# Patient Record
Sex: Female | Born: 1985 | Race: Black or African American | Hispanic: No | Marital: Single | Smoking: Never smoker
Health system: Southern US, Community
[De-identification: ages and names within clinical notes are randomized; demographics above are authoritative.]

## PROBLEM LIST (undated history)

## (undated) ENCOUNTER — Inpatient Hospital Stay (HOSPITAL_COMMUNITY): Payer: Self-pay

## (undated) DIAGNOSIS — R87629 Unspecified abnormal cytological findings in specimens from vagina: Secondary | ICD-10-CM

## (undated) DIAGNOSIS — F419 Anxiety disorder, unspecified: Secondary | ICD-10-CM

## (undated) DIAGNOSIS — D649 Anemia, unspecified: Secondary | ICD-10-CM

---
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
## 2007-02-14 ENCOUNTER — Emergency Department (HOSPITAL_COMMUNITY): Admission: EM | Admit: 2007-02-14 | Discharge: 2007-02-14 | Payer: Self-pay | Admitting: Family Medicine

## 2007-03-30 ENCOUNTER — Emergency Department (HOSPITAL_COMMUNITY): Admission: EM | Admit: 2007-03-30 | Discharge: 2007-03-30 | Payer: Self-pay | Admitting: Family Medicine

## 2007-04-09 ENCOUNTER — Inpatient Hospital Stay (HOSPITAL_COMMUNITY): Admission: AD | Admit: 2007-04-09 | Discharge: 2007-04-09 | Payer: Self-pay | Admitting: Family Medicine

## 2007-05-08 ENCOUNTER — Emergency Department (HOSPITAL_COMMUNITY): Admission: EM | Admit: 2007-05-08 | Discharge: 2007-05-08 | Payer: Self-pay | Admitting: Emergency Medicine

## 2007-06-07 ENCOUNTER — Emergency Department (HOSPITAL_COMMUNITY): Admission: EM | Admit: 2007-06-07 | Discharge: 2007-06-07 | Payer: Self-pay | Admitting: Family Medicine

## 2007-07-05 ENCOUNTER — Emergency Department (HOSPITAL_COMMUNITY): Admission: EM | Admit: 2007-07-05 | Discharge: 2007-07-05 | Payer: Self-pay | Admitting: Family Medicine

## 2007-07-08 ENCOUNTER — Emergency Department (HOSPITAL_COMMUNITY): Admission: EM | Admit: 2007-07-08 | Discharge: 2007-07-08 | Payer: Self-pay | Admitting: Emergency Medicine

## 2007-07-09 ENCOUNTER — Emergency Department (HOSPITAL_COMMUNITY): Admission: EM | Admit: 2007-07-09 | Discharge: 2007-07-09 | Payer: Self-pay | Admitting: Emergency Medicine

## 2007-11-16 ENCOUNTER — Emergency Department (HOSPITAL_COMMUNITY): Admission: EM | Admit: 2007-11-16 | Discharge: 2007-11-16 | Payer: Self-pay | Admitting: Emergency Medicine

## 2008-04-10 ENCOUNTER — Emergency Department (HOSPITAL_COMMUNITY): Admission: EM | Admit: 2008-04-10 | Discharge: 2008-04-10 | Payer: Self-pay | Admitting: Family Medicine

## 2008-10-04 ENCOUNTER — Emergency Department (HOSPITAL_COMMUNITY): Admission: EM | Admit: 2008-10-04 | Discharge: 2008-10-04 | Payer: Self-pay | Admitting: Emergency Medicine

## 2008-12-27 ENCOUNTER — Emergency Department (HOSPITAL_COMMUNITY): Admission: EM | Admit: 2008-12-27 | Discharge: 2008-12-27 | Payer: Self-pay | Admitting: Family Medicine

## 2009-02-11 ENCOUNTER — Emergency Department (HOSPITAL_COMMUNITY): Admission: EM | Admit: 2009-02-11 | Discharge: 2009-02-11 | Payer: Self-pay | Admitting: Emergency Medicine

## 2009-06-10 ENCOUNTER — Emergency Department (HOSPITAL_COMMUNITY): Admission: EM | Admit: 2009-06-10 | Discharge: 2009-06-10 | Payer: Self-pay | Admitting: Family Medicine

## 2009-09-13 ENCOUNTER — Emergency Department (HOSPITAL_COMMUNITY): Admission: EM | Admit: 2009-09-13 | Discharge: 2009-09-13 | Payer: Self-pay | Admitting: Family Medicine

## 2009-11-19 ENCOUNTER — Emergency Department (HOSPITAL_COMMUNITY): Admission: EM | Admit: 2009-11-19 | Discharge: 2009-11-19 | Payer: Self-pay | Admitting: Family Medicine

## 2009-12-17 ENCOUNTER — Emergency Department (HOSPITAL_COMMUNITY): Admission: EM | Admit: 2009-12-17 | Discharge: 2009-12-17 | Payer: Self-pay | Admitting: Family Medicine

## 2010-01-01 ENCOUNTER — Emergency Department (HOSPITAL_COMMUNITY): Admission: EM | Admit: 2010-01-01 | Discharge: 2010-01-01 | Payer: Self-pay | Admitting: Family Medicine

## 2010-01-23 ENCOUNTER — Emergency Department (HOSPITAL_COMMUNITY): Admission: EM | Admit: 2010-01-23 | Discharge: 2010-01-23 | Payer: Self-pay | Admitting: Emergency Medicine

## 2010-01-24 ENCOUNTER — Emergency Department (HOSPITAL_COMMUNITY): Admission: EM | Admit: 2010-01-24 | Discharge: 2010-01-24 | Payer: Self-pay | Admitting: Emergency Medicine

## 2010-02-20 ENCOUNTER — Inpatient Hospital Stay (HOSPITAL_COMMUNITY): Admission: AD | Admit: 2010-02-20 | Discharge: 2010-02-21 | Payer: Self-pay | Admitting: Family Medicine

## 2010-03-10 ENCOUNTER — Emergency Department (HOSPITAL_COMMUNITY): Admission: EM | Admit: 2010-03-10 | Discharge: 2010-03-10 | Payer: Self-pay | Admitting: Emergency Medicine

## 2010-04-02 ENCOUNTER — Inpatient Hospital Stay (HOSPITAL_COMMUNITY): Admission: AD | Admit: 2010-04-02 | Discharge: 2010-04-03 | Payer: Self-pay | Admitting: Obstetrics & Gynecology

## 2010-04-02 ENCOUNTER — Ambulatory Visit: Payer: Self-pay | Admitting: Advanced Practice Midwife

## 2010-05-08 ENCOUNTER — Inpatient Hospital Stay (HOSPITAL_COMMUNITY)
Admission: AD | Admit: 2010-05-08 | Discharge: 2010-05-08 | Payer: Self-pay | Source: Home / Self Care | Admitting: Obstetrics and Gynecology

## 2010-07-29 ENCOUNTER — Inpatient Hospital Stay (HOSPITAL_COMMUNITY)
Admission: AD | Admit: 2010-07-29 | Discharge: 2010-07-29 | Payer: Self-pay | Source: Home / Self Care | Admitting: Obstetrics and Gynecology

## 2010-09-06 ENCOUNTER — Observation Stay (HOSPITAL_COMMUNITY)
Admission: RE | Admit: 2010-09-06 | Discharge: 2010-09-06 | Payer: Self-pay | Source: Home / Self Care | Admitting: Obstetrics and Gynecology

## 2010-09-11 ENCOUNTER — Inpatient Hospital Stay (HOSPITAL_COMMUNITY)
Admission: AD | Admit: 2010-09-11 | Discharge: 2010-09-12 | Payer: Self-pay | Source: Home / Self Care | Admitting: Obstetrics and Gynecology

## 2010-10-12 ENCOUNTER — Inpatient Hospital Stay (HOSPITAL_COMMUNITY)
Admission: AD | Admit: 2010-10-12 | Discharge: 2010-10-13 | Disposition: A | Discharge status: Still In Hospital | Payer: Self-pay | Source: Home / Self Care | Attending: Obstetrics and Gynecology | Admitting: Obstetrics and Gynecology

## 2010-10-18 ENCOUNTER — Inpatient Hospital Stay (HOSPITAL_COMMUNITY)
Admission: AD | Admit: 2010-10-18 | Discharge: 2010-10-22 | Payer: Self-pay | Source: Home / Self Care | Attending: Obstetrics and Gynecology | Admitting: Obstetrics and Gynecology

## 2010-10-19 ENCOUNTER — Encounter (INDEPENDENT_AMBULATORY_CARE_PROVIDER_SITE_OTHER): Payer: Self-pay | Admitting: Obstetrics and Gynecology

## 2010-11-28 ENCOUNTER — Emergency Department (HOSPITAL_COMMUNITY)
Admission: EM | Admit: 2010-11-28 | Discharge: 2010-11-28 | Payer: Self-pay | Source: Home / Self Care | Admitting: Emergency Medicine

## 2010-11-30 ENCOUNTER — Inpatient Hospital Stay (INDEPENDENT_AMBULATORY_CARE_PROVIDER_SITE_OTHER)
Admission: RE | Admit: 2010-11-30 | Discharge: 2010-11-30 | Disposition: A | Discharge status: Home / Self Care | Payer: Medicaid Other | Source: Ambulatory Visit | Attending: Family Medicine | Admitting: Family Medicine

## 2010-11-30 ENCOUNTER — Other Ambulatory Visit: Payer: Self-pay | Admitting: Family Medicine

## 2010-11-30 ENCOUNTER — Other Ambulatory Visit: Payer: Self-pay

## 2010-11-30 DIAGNOSIS — R509 Fever, unspecified: Secondary | ICD-10-CM

## 2010-11-30 LAB — POCT URINALYSIS DIPSTICK
Nitrite: NEGATIVE
Protein, ur: NEGATIVE mg/dL
Specific Gravity, Urine: 1.015 (ref 1.005–1.030)
Urobilinogen, UA: 0.2 mg/dL (ref 0.0–1.0)

## 2010-12-02 ENCOUNTER — Inpatient Hospital Stay (HOSPITAL_COMMUNITY)
Admission: AD | Admit: 2010-12-02 | Discharge: 2010-12-02 | Disposition: A | Discharge status: Home / Self Care | Payer: Medicaid Other | Source: Ambulatory Visit | Attending: Obstetrics and Gynecology | Admitting: Obstetrics and Gynecology

## 2010-12-02 DIAGNOSIS — B9789 Other viral agents as the cause of diseases classified elsewhere: Secondary | ICD-10-CM | POA: Insufficient documentation

## 2010-12-02 DIAGNOSIS — O9081 Anemia of the puerperium: Secondary | ICD-10-CM | POA: Insufficient documentation

## 2010-12-02 DIAGNOSIS — O99893 Other specified diseases and conditions complicating puerperium: Secondary | ICD-10-CM | POA: Insufficient documentation

## 2010-12-02 DIAGNOSIS — D649 Anemia, unspecified: Secondary | ICD-10-CM | POA: Insufficient documentation

## 2010-12-02 LAB — CBC
MCV: 76.9 fL — ABNORMAL LOW (ref 78.0–100.0)
Platelets: 371 10*3/uL (ref 150–400)
RDW: 15.9 % — ABNORMAL HIGH (ref 11.5–15.5)
WBC: 8.4 10*3/uL (ref 4.0–10.5)

## 2010-12-02 LAB — COMPREHENSIVE METABOLIC PANEL
ALT: 37 U/L — ABNORMAL HIGH (ref 0–35)
AST: 23 U/L (ref 0–37)
Albumin: 3.8 g/dL (ref 3.5–5.2)
Calcium: 9.8 mg/dL (ref 8.4–10.5)
GFR calc Af Amer: >60 mL/min (ref >60)
Sodium: 137 mEq/L (ref 135–145)
Total Protein: 7.6 g/dL (ref 6.0–8.3)

## 2010-12-02 LAB — URINALYSIS, ROUTINE W REFLEX MICROSCOPIC
Bilirubin Urine: NEGATIVE
Specific Gravity, Urine: 1.02 (ref 1.005–1.030)
Urobilinogen, UA: 0.2 mg/dL (ref 0.0–1.0)
pH: 6 (ref 5.0–8.0)

## 2010-12-02 LAB — URINE MICROSCOPIC-ADD ON

## 2010-12-02 LAB — WET PREP, GENITAL: Clue Cells Wet Prep HPF POC: NONE SEEN

## 2011-01-08 LAB — CBC
Hemoglobin: 6.9 g/dL — CL (ref 12.0–15.0)
MCHC: 34.7 g/dL (ref 30.0–36.0)
Platelets: 241 10*3/uL (ref 150–400)
Platelets: 317 10*3/uL (ref 150–400)
RBC: 2.51 MIL/uL — ABNORMAL LOW (ref 3.87–5.11)
RBC: 3.25 MIL/uL — ABNORMAL LOW (ref 3.87–5.11)
WBC: 6.3 10*3/uL (ref 4.0–10.5)

## 2011-01-08 LAB — WET PREP, GENITAL: Trich, Wet Prep: NONE SEEN

## 2011-01-08 LAB — RPR: RPR Ser Ql: NONREACTIVE

## 2011-01-09 LAB — URINALYSIS, ROUTINE W REFLEX MICROSCOPIC
Bilirubin Urine: NEGATIVE
Glucose, UA: NEGATIVE mg/dL
Glucose, UA: NEGATIVE mg/dL
Hgb urine dipstick: NEGATIVE
Ketones, ur: NEGATIVE mg/dL
Nitrite: NEGATIVE
Protein, ur: NEGATIVE mg/dL
Protein, ur: NEGATIVE mg/dL
pH: 6.5 (ref 5.0–8.0)

## 2011-01-09 LAB — WET PREP, GENITAL: Trich, Wet Prep: NONE SEEN

## 2011-01-09 LAB — URINE CULTURE
Colony Count: NO GROWTH
Culture  Setup Time: 201111150419

## 2011-01-09 LAB — URINE MICROSCOPIC-ADD ON

## 2011-01-11 LAB — URINALYSIS, ROUTINE W REFLEX MICROSCOPIC
Nitrite: NEGATIVE
Protein, ur: 30 mg/dL — AB
Specific Gravity, Urine: 1.01 (ref 1.005–1.030)
Urobilinogen, UA: 0.2 mg/dL (ref 0.0–1.0)

## 2011-01-11 LAB — URINE MICROSCOPIC-ADD ON

## 2011-01-14 LAB — URINALYSIS, ROUTINE W REFLEX MICROSCOPIC
Glucose, UA: NEGATIVE mg/dL
Hgb urine dipstick: NEGATIVE
Ketones, ur: 15 mg/dL — AB
Protein, ur: NEGATIVE mg/dL
pH: 5.5 (ref 5.0–8.0)

## 2011-01-15 LAB — URINALYSIS, ROUTINE W REFLEX MICROSCOPIC
Bilirubin Urine: NEGATIVE
Glucose, UA: NEGATIVE mg/dL
Ketones, ur: 15 mg/dL — AB
Nitrite: NEGATIVE
Specific Gravity, Urine: >1.03 — ABNORMAL HIGH (ref 1.005–1.030)
pH: 5.5 (ref 5.0–8.0)

## 2011-01-16 LAB — CBC
Platelets: 337 10*3/uL (ref 150–400)
RDW: 12.9 % (ref 11.5–15.5)
WBC: 8.8 10*3/uL (ref 4.0–10.5)

## 2011-01-16 LAB — URINALYSIS, ROUTINE W REFLEX MICROSCOPIC
Bilirubin Urine: NEGATIVE
Nitrite: NEGATIVE
Nitrite: NEGATIVE
Protein, ur: NEGATIVE mg/dL
Specific Gravity, Urine: 1.018 (ref 1.005–1.030)
Specific Gravity, Urine: 1.02 (ref 1.005–1.030)
Urobilinogen, UA: 0.2 mg/dL (ref 0.0–1.0)
Urobilinogen, UA: 0.2 mg/dL (ref 0.0–1.0)
pH: 5.5 (ref 5.0–8.0)

## 2011-01-16 LAB — GC/CHLAMYDIA PROBE AMP, GENITAL: GC Probe Amp, Genital: NEGATIVE

## 2011-01-16 LAB — POCT I-STAT, CHEM 8
Hemoglobin: 11.2 g/dL — ABNORMAL LOW (ref 12.0–15.0)
Potassium: 3.9 mEq/L (ref 3.5–5.1)
Sodium: 136 mEq/L (ref 135–145)
TCO2: 22 mmol/L (ref 0–100)

## 2011-01-16 LAB — WET PREP, GENITAL: Yeast Wet Prep HPF POC: NONE SEEN

## 2011-01-16 LAB — ABO/RH: ABO/RH(D): B POS

## 2011-01-16 LAB — POCT PREGNANCY, URINE
Preg Test, Ur: POSITIVE
Preg Test, Ur: POSITIVE

## 2011-01-16 LAB — HCG, QUANTITATIVE, PREGNANCY: hCG, Beta Chain, Quant, S: 13252 m[IU]/mL — ABNORMAL HIGH (ref <5)

## 2011-01-17 LAB — POCT URINALYSIS DIP (DEVICE)
Bilirubin Urine: NEGATIVE
Ketones, ur: NEGATIVE mg/dL
Specific Gravity, Urine: 1.015 (ref 1.005–1.030)
pH: 7 (ref 5.0–8.0)

## 2011-01-19 LAB — CBC
HCT: 33 % — ABNORMAL LOW (ref 36.0–46.0)
Hemoglobin: 11.1 g/dL — ABNORMAL LOW (ref 12.0–15.0)
MCH: 25.7 pg — ABNORMAL LOW (ref 26.0–34.0)
RBC: 4.32 MIL/uL (ref 3.87–5.11)

## 2011-01-19 LAB — POCT URINALYSIS DIP (DEVICE)
Bilirubin Urine: NEGATIVE
Glucose, UA: NEGATIVE mg/dL
Specific Gravity, Urine: 1.015 (ref 1.005–1.030)
Urobilinogen, UA: 0.2 mg/dL (ref 0.0–1.0)

## 2011-01-21 LAB — DIFFERENTIAL
Eosinophils Absolute: 0.1 10*3/uL (ref 0.0–0.7)
Eosinophils Relative: 2 % (ref 0–5)
Lymphs Abs: 1.4 10*3/uL (ref 0.7–4.0)
Monocytes Absolute: 0.3 10*3/uL (ref 0.1–1.0)
Monocytes Relative: 4 % (ref 3–12)

## 2011-01-21 LAB — COMPREHENSIVE METABOLIC PANEL
ALT: 20 U/L (ref 0–35)
AST: 24 U/L (ref 0–37)
Albumin: 4.5 g/dL (ref 3.5–5.2)
CO2: 27 mEq/L (ref 19–32)
Calcium: 9.4 mg/dL (ref 8.4–10.5)
GFR calc Af Amer: >60 mL/min (ref >60)
GFR calc non Af Amer: >60 mL/min (ref >60)
Sodium: 136 mEq/L (ref 135–145)

## 2011-01-21 LAB — URINE CULTURE

## 2011-01-21 LAB — URINALYSIS, ROUTINE W REFLEX MICROSCOPIC
Nitrite: NEGATIVE
Nitrite: POSITIVE — AB
Specific Gravity, Urine: 1.018 (ref 1.005–1.030)
Specific Gravity, Urine: 1.021 (ref 1.005–1.030)
Urobilinogen, UA: 0.2 mg/dL (ref 0.0–1.0)
pH: 5.5 (ref 5.0–8.0)

## 2011-01-21 LAB — URINE MICROSCOPIC-ADD ON

## 2011-01-21 LAB — CBC
MCHC: 34 g/dL (ref 30.0–36.0)
RBC: 4.05 MIL/uL (ref 3.87–5.11)
WBC: 7.1 10*3/uL (ref 4.0–10.5)

## 2011-01-21 LAB — POCT PREGNANCY, URINE: Preg Test, Ur: NEGATIVE

## 2011-02-07 LAB — DIFFERENTIAL
Eosinophils Relative: 4 % (ref 0–5)
Lymphocytes Relative: 31 % (ref 12–46)
Lymphs Abs: 1.6 10*3/uL (ref 0.7–4.0)
Monocytes Absolute: 0.3 10*3/uL (ref 0.1–1.0)
Neutro Abs: 2.9 10*3/uL (ref 1.7–7.7)

## 2011-02-07 LAB — URINALYSIS, ROUTINE W REFLEX MICROSCOPIC
Bilirubin Urine: NEGATIVE
Hgb urine dipstick: NEGATIVE
Nitrite: NEGATIVE
Specific Gravity, Urine: 1.007 (ref 1.005–1.030)
pH: 6 (ref 5.0–8.0)

## 2011-02-07 LAB — CBC
MCHC: 34.6 g/dL (ref 30.0–36.0)
MCV: 87.7 fL (ref 78.0–100.0)
Platelets: 276 10*3/uL (ref 150–400)
RDW: 14 % (ref 11.5–15.5)
WBC: 5.1 10*3/uL (ref 4.0–10.5)

## 2011-02-07 LAB — COMPREHENSIVE METABOLIC PANEL
AST: 20 U/L (ref 0–37)
Albumin: 4 g/dL (ref 3.5–5.2)
BUN: 9 mg/dL (ref 6–23)
Calcium: 9.3 mg/dL (ref 8.4–10.5)
Creatinine, Ser: 0.93 mg/dL (ref 0.4–1.2)
GFR calc Af Amer: >60 mL/min (ref >60)
Total Protein: 6.8 g/dL (ref 6.0–8.3)

## 2011-03-09 ENCOUNTER — Inpatient Hospital Stay (HOSPITAL_COMMUNITY)
Admission: AD | Admit: 2011-03-09 | Discharge: 2011-03-09 | Disposition: A | Discharge status: Home / Self Care | Payer: 59 | Source: Ambulatory Visit | Attending: Obstetrics and Gynecology | Admitting: Obstetrics and Gynecology

## 2011-03-09 DIAGNOSIS — N390 Urinary tract infection, site not specified: Secondary | ICD-10-CM | POA: Insufficient documentation

## 2011-03-09 DIAGNOSIS — R300 Dysuria: Secondary | ICD-10-CM | POA: Insufficient documentation

## 2011-03-09 LAB — WET PREP, GENITAL
Trich, Wet Prep: NONE SEEN
Yeast Wet Prep HPF POC: NONE SEEN

## 2011-03-09 LAB — URINALYSIS, ROUTINE W REFLEX MICROSCOPIC
Bilirubin Urine: NEGATIVE
Glucose, UA: NEGATIVE mg/dL
Specific Gravity, Urine: 1.02 (ref 1.005–1.030)
pH: 6 (ref 5.0–8.0)

## 2011-03-09 LAB — URINE MICROSCOPIC-ADD ON

## 2011-03-09 LAB — POCT PREGNANCY, URINE: Preg Test, Ur: NEGATIVE

## 2011-03-10 LAB — GC/CHLAMYDIA PROBE AMP, GENITAL: Chlamydia, DNA Probe: NEGATIVE

## 2011-03-11 LAB — URINE CULTURE
Colony Count: >=100000
Culture  Setup Time: 201205111938

## 2011-04-03 ENCOUNTER — Inpatient Hospital Stay (INDEPENDENT_AMBULATORY_CARE_PROVIDER_SITE_OTHER)
Admission: RE | Admit: 2011-04-03 | Discharge: 2011-04-03 | Disposition: A | Discharge status: Home / Self Care | Payer: Self-pay | Source: Ambulatory Visit | Attending: Family Medicine | Admitting: Family Medicine

## 2011-04-03 DIAGNOSIS — J019 Acute sinusitis, unspecified: Secondary | ICD-10-CM

## 2011-05-09 ENCOUNTER — Emergency Department (HOSPITAL_COMMUNITY)
Admission: EM | Admit: 2011-05-09 | Discharge: 2011-05-09 | Disposition: A | Discharge status: Home / Self Care | Payer: No Typology Code available for payment source | Attending: Emergency Medicine | Admitting: Emergency Medicine

## 2011-05-09 ENCOUNTER — Emergency Department (HOSPITAL_COMMUNITY): Payer: No Typology Code available for payment source

## 2011-05-09 DIAGNOSIS — M545 Low back pain, unspecified: Secondary | ICD-10-CM | POA: Insufficient documentation

## 2011-05-09 DIAGNOSIS — Y92410 Unspecified street and highway as the place of occurrence of the external cause: Secondary | ICD-10-CM | POA: Insufficient documentation

## 2011-05-09 DIAGNOSIS — S335XXA Sprain of ligaments of lumbar spine, initial encounter: Secondary | ICD-10-CM | POA: Insufficient documentation

## 2011-07-13 ENCOUNTER — Inpatient Hospital Stay (HOSPITAL_COMMUNITY)
Admission: RE | Admit: 2011-07-13 | Discharge: 2011-07-13 | Disposition: A | Discharge status: Home / Self Care | Payer: Medicaid Other | Source: Ambulatory Visit | Attending: Emergency Medicine | Admitting: Emergency Medicine

## 2011-07-16 ENCOUNTER — Emergency Department (HOSPITAL_COMMUNITY)
Admission: EM | Admit: 2011-07-16 | Discharge: 2011-07-17 | Discharge status: Against Medical Advice | Payer: Medicaid Other | Attending: Emergency Medicine | Admitting: Emergency Medicine

## 2011-07-16 DIAGNOSIS — M542 Cervicalgia: Secondary | ICD-10-CM | POA: Insufficient documentation

## 2011-07-16 DIAGNOSIS — R51 Headache: Secondary | ICD-10-CM | POA: Insufficient documentation

## 2011-08-13 LAB — POCT URINALYSIS DIP (DEVICE)
Ketones, ur: NEGATIVE
Operator id: 270961
Protein, ur: 30 — AB
Specific Gravity, Urine: 1.02

## 2011-08-13 LAB — GC/CHLAMYDIA PROBE AMP, GENITAL: GC Probe Amp, Genital: NEGATIVE

## 2011-08-13 LAB — WET PREP, GENITAL: Yeast Wet Prep HPF POC: NONE SEEN

## 2011-08-14 LAB — POCT URINALYSIS DIP (DEVICE)
Ketones, ur: NEGATIVE
Protein, ur: 100 — AB
Specific Gravity, Urine: 1.015
pH: 6.5

## 2011-08-14 LAB — RPR: RPR Ser Ql: NONREACTIVE

## 2011-08-14 LAB — POCT PREGNANCY, URINE: Operator id: 239701

## 2011-08-14 LAB — GC/CHLAMYDIA PROBE AMP, GENITAL
Chlamydia, DNA Probe: POSITIVE — AB
GC Probe Amp, Genital: NEGATIVE

## 2011-08-14 LAB — HIV ANTIBODY (ROUTINE TESTING W REFLEX): HIV: NONREACTIVE

## 2011-08-14 LAB — WET PREP, GENITAL

## 2011-08-16 LAB — URINALYSIS, ROUTINE W REFLEX MICROSCOPIC
Nitrite: NEGATIVE
Specific Gravity, Urine: 1.025
pH: 6

## 2011-08-16 LAB — GC/CHLAMYDIA PROBE AMP, GENITAL
Chlamydia, DNA Probe: NEGATIVE
GC Probe Amp, Genital: NEGATIVE

## 2011-08-16 LAB — CBC
HCT: 31.8 — ABNORMAL LOW
Hemoglobin: 11 — ABNORMAL LOW
MCHC: 34.5
MCV: 87.1
Platelets: 328 — ABNORMAL HIGH
RBC: 3.65 — ABNORMAL LOW
RDW: 13.1
WBC: 8.3

## 2011-08-16 LAB — WET PREP, GENITAL
Clue Cells Wet Prep HPF POC: NONE SEEN
Trich, Wet Prep: NONE SEEN
Yeast Wet Prep HPF POC: NONE SEEN

## 2011-08-16 LAB — POCT PREGNANCY, URINE: Operator id: 20265

## 2011-08-16 LAB — POCT RAPID STREP A: Streptococcus, Group A Screen (Direct): NEGATIVE

## 2011-09-18 ENCOUNTER — Emergency Department (HOSPITAL_COMMUNITY)
Admission: EM | Admit: 2011-09-18 | Discharge: 2011-09-18 | Disposition: A | Discharge status: Home / Self Care | Payer: Medicaid Other | Source: Home / Self Care | Attending: Emergency Medicine | Admitting: Emergency Medicine

## 2011-09-18 ENCOUNTER — Encounter: Payer: Self-pay | Admitting: *Deleted

## 2011-09-18 DIAGNOSIS — R0982 Postnasal drip: Secondary | ICD-10-CM

## 2011-09-18 DIAGNOSIS — J329 Chronic sinusitis, unspecified: Secondary | ICD-10-CM

## 2011-09-18 LAB — POCT URINALYSIS DIP (DEVICE)
Leukocytes, UA: NEGATIVE
Nitrite: NEGATIVE
Urobilinogen, UA: 0.2 mg/dL (ref 0.0–1.0)
pH: 5 (ref 5.0–8.0)

## 2011-09-18 MED ORDER — FEXOFENADINE-PSEUDOEPHED ER 60-120 MG PO TB12: 1.0000 | ORAL_TABLET | Freq: Two times a day (BID) | ORAL | Status: DC

## 2011-09-18 MED ORDER — PREDNISONE 20 MG PO TABS: 40.0000 mg | ORAL_TABLET | Freq: Every day | ORAL | Status: AC

## 2011-09-18 NOTE — ED Notes (Signed)
Pt reports h/o many sinus headaches the past several months.  She has gotten care for these at another Urgent Care and took amoxicillin for this twice.  Today c/o thick sinus congestion, neck pain  nausea and headache.  Denies fever

## 2011-09-18 NOTE — ED Provider Notes (Addendum)
History     CSN: [id number] Arrival date & time: 09/18/2011 11:31 AM   First MD Initiated Contact with Patient 09/18/11 1043      Chief Complaint  Patient presents with  . Headache    (Consider location/radiation/quality/duration/timing/severity/associated sxs/prior treatment) HPI Comments: Been getting this sinus infections, feel dripping behind my throat and pressure on my face, blow my nose with thick discharge, green at times, have been treated many times for the same, my stomach its "hurting ad makes my nauseous" have completed several antibiotics  Patient is a 25 y.o. female presenting with headaches. The history is provided by the patient.  Headache The primary symptoms include headaches and nausea. Primary symptoms do not include altered mental status, paresthesias, loss of sensation, speech change or vomiting. The symptoms began more than 1 week ago. The symptoms are worsening.  The headache is not associated with photophobia, paresthesias or weakness.  Additional symptoms do not include weakness, photophobia or tinnitus.    History reviewed. No pertinent past medical history.  Past Surgical History  Procedure Date  . Cesarean section     No family history on file.  History  Substance Use Topics  . Smoking status: Never Smoker   . Smokeless tobacco: Never Used  . Alcohol Use: No    OB History    Grav Para Term Preterm Abortions TAB SAB Ect Mult Living                  Review of Systems  HENT: Negative for tinnitus.   Eyes: Negative for photophobia.  Gastrointestinal: Positive for nausea. Negative for vomiting.  Neurological: Positive for headaches. Negative for speech change, weakness and paresthesias.  Psychiatric/Behavioral: Negative for altered mental status.    Allergies  Review of patient's allergies indicates no known allergies.  Home Medications  No current outpatient prescriptions on file.  BP 122/72  Pulse 70  Temp(Src) 99.1 F (37.3 C)  (Oral)  Resp 16  SpO2 100%  LMP 09/13/2011  Physical Exam  Nursing note and vitals reviewed. Constitutional: She is oriented to person, place, and time. She appears well-developed and well-nourished.  HENT:  Head: Normocephalic.  Right Ear: Tympanic membrane normal.  Left Ear: Tympanic membrane normal.  Mouth/Throat: Uvula is midline, oropharynx is clear and moist and mucous membranes are normal. No oropharyngeal exudate.  Eyes: Pupils are equal, round, and reactive to light.  Pulmonary/Chest: Effort normal and breath sounds normal.  Abdominal: Soft.  Neurological: She is alert and oriented to person, place, and time. No cranial nerve deficit. Coordination normal.  Skin: Skin is warm. No rash noted.    ED Course  Procedures (including critical care time)  Labs Reviewed  POCT URINALYSIS DIP (DEVICE) - Abnormal; Notable for the following:    Hgb urine dipstick TRACE (*)    All other components within normal limits  POCT PREGNANCY, URINE  POCT PREGNANCY, URINE  POCT URINALYSIS DIPSTICK   No results found.   No diagnosis found.    MDM  recurent sinusitis > 6 episodes in 6 months treated at another Urgent Care in town-        Jimmie Molly, MD 09/18/11 1257  Jimmie Molly, MD 09/18/11 1311  Jimmie Molly, MD 09/18/11 [phone]

## 2011-09-19 ENCOUNTER — Telehealth (HOSPITAL_COMMUNITY): Payer: Self-pay | Admitting: *Deleted

## 2011-12-03 ENCOUNTER — Encounter (HOSPITAL_COMMUNITY): Payer: Self-pay | Admitting: *Deleted

## 2011-12-03 ENCOUNTER — Emergency Department (HOSPITAL_COMMUNITY)
Admission: EM | Admit: 2011-12-03 | Discharge: 2011-12-03 | Disposition: A | Discharge status: Home / Self Care | Payer: Self-pay | Attending: Emergency Medicine | Admitting: Emergency Medicine

## 2011-12-03 DIAGNOSIS — R059 Cough, unspecified: Secondary | ICD-10-CM | POA: Insufficient documentation

## 2011-12-03 DIAGNOSIS — H9209 Otalgia, unspecified ear: Secondary | ICD-10-CM | POA: Insufficient documentation

## 2011-12-03 DIAGNOSIS — R05 Cough: Secondary | ICD-10-CM | POA: Insufficient documentation

## 2011-12-03 DIAGNOSIS — J069 Acute upper respiratory infection, unspecified: Secondary | ICD-10-CM | POA: Insufficient documentation

## 2011-12-03 DIAGNOSIS — IMO0001 Reserved for inherently not codable concepts without codable children: Secondary | ICD-10-CM | POA: Insufficient documentation

## 2011-12-03 MED ORDER — CETIRIZINE-PSEUDOEPHEDRINE ER 5-120 MG PO TB12: 1.0000 | ORAL_TABLET | Freq: Every day | ORAL | Status: DC

## 2011-12-03 MED ORDER — HYDROCODONE-HOMATROPINE 5-1.5 MG/5ML PO SYRP: 5.0000 mL | ORAL_SOLUTION | Freq: Four times a day (QID) | ORAL | Status: AC | PRN

## 2011-12-03 NOTE — ED Notes (Signed)
Pt reports nonproductive cough since Saturday. Reports sore throat and earache bilaterally. Denies fever.

## 2011-12-03 NOTE — ED Provider Notes (Signed)
History     CSN: [id number]  Arrival date & time 12/03/11  1320   First MD Initiated Contact with Patient 12/03/11 1523      Chief Complaint  Patient presents with  . Cough  . Sore Throat  . Otalgia    (Consider location/radiation/quality/duration/timing/severity/associated sxs/prior treatment) HPI Comments: Pt presents to the ED with complaints of URI symptoms of non-productive cough, congestion, sore throat, earache,  The patient states that the symptoms started 3 days ago.  Pt has not been around other sick contacts and reports that she suffers from seasonal allergies. The patient denies fevers, night sweats, chills, headaches, neck pain, weakness, vision changes, severe abdominal pain, inability to eat or drink, difficulty breathing, SOB, wheezing, & chest pain. The patient has tried cough medicine, NSAIDS, and rest but has only felt mild relief.    Patient is a 26 y.o. female presenting with cough, pharyngitis, and ear pain. The history is provided by the patient.  Cough Associated symptoms include ear pain and myalgias. Pertinent negatives include no chest pain, no chills, no rhinorrhea and no sore throat.  Sore Throat Associated symptoms include coughing and myalgias. Pertinent negatives include no abdominal pain, chest pain, chills, congestion, fatigue, fever, nausea, neck pain, rash, sore throat, vomiting or weakness.  Otalgia Associated symptoms include cough. Pertinent negatives include no rhinorrhea, no sore throat, no abdominal pain, no diarrhea, no vomiting, no neck pain and no rash.    History reviewed. No pertinent past medical history.  Past Surgical History  Procedure Date  . Cesarean section     No family history on file.  History  Substance Use Topics  . Smoking status: Never Smoker   . Smokeless tobacco: Never Used  . Alcohol Use: No    OB History    Grav Para Term Preterm Abortions TAB SAB Ect Mult Living                  Review of Systems    Constitutional: Negative for fever, chills and fatigue.  HENT: Positive for ear pain. Negative for congestion, sore throat, rhinorrhea, sneezing, neck pain, neck stiffness, sinus pressure and tinnitus.   Eyes: Negative for visual disturbance.  Respiratory: Positive for cough. Negative for chest tightness.   Cardiovascular: Negative for chest pain and palpitations.  Gastrointestinal: Negative for nausea, vomiting, abdominal pain and diarrhea.  Genitourinary: Negative for dysuria.  Musculoskeletal: Positive for myalgias.  Skin: Negative for color change and rash.  Neurological: Negative for dizziness and weakness.  Hematological: Does not bruise/bleed easily.  Psychiatric/Behavioral: Negative for confusion.  All other systems reviewed and are negative.    Allergies  Review of patient's allergies indicates no known allergies.  Home Medications   Current Outpatient Rx  Name Route Sig Dispense Refill  . LEVONORGESTREL-ETHINYL ESTRAD 0.1-20 MG-MCG PO TABS Oral Take 1 tablet by mouth daily.      BP 109/73  Pulse 91  Temp(Src) 98.5 F (36.9 C) (Oral)  Resp 16  Ht 5\' 2"  (1.575 m)  Wt 130 lb (58.968 kg)  BMI 23.78 kg/m2  SpO2 95%  Physical Exam  Nursing note and vitals reviewed. Constitutional: She is oriented to person, place, and time. She appears well-developed and well-nourished. No distress.  HENT:  Head: Normocephalic and atraumatic. No trismus in the jaw.  Right Ear: External ear normal. No drainage or tenderness. No mastoid tenderness.  Left Ear: External ear normal. No drainage or tenderness. No mastoid tenderness.  Nose: Nose normal. No rhinorrhea or sinus  tenderness.  Mouth/Throat: Uvula is midline, oropharynx is clear and moist and mucous membranes are normal. No uvula swelling. No oropharyngeal exudate.  Eyes: Conjunctivae and EOM are normal. Right eye exhibits no discharge. Left eye exhibits no discharge. No scleral icterus.  Neck: Normal range of motion. Neck  supple.  Cardiovascular: Normal rate, regular rhythm and normal heart sounds.   Pulmonary/Chest: Effort normal and breath sounds normal. No stridor. No respiratory distress. She has no wheezes. She has no rales. She exhibits no tenderness.  Abdominal: Soft. There is no tenderness.  Musculoskeletal: Normal range of motion.  Neurological: She is alert and oriented to person, place, and time.  Skin: Skin is warm and dry. No rash noted. She is not diaphoretic.  Psychiatric: She has a normal mood and affect. Her behavior is normal.    ED Course  Procedures (including critical care time)  Labs Reviewed - No data to display No results found.   No diagnosis found.     MDM  Viral URI  Patient with symptoms consistent with URI  Vitals are stable, afebrile.  No signs of dehydration, tolerating PO's.  Lungs are clear. Due to patient's presentation and physical exam a chest x-ray was not ordered bc likely diagnosis of viral etiology.  Patient will be discharged with instructions to orally hydrate, rest, and use over-the-counter medications such as anti-inflammatories ibuprofen and Aleve for muscle aches and Tylenol for fever.  Patient will also be given a cough suppressant. Patient has been told to use OTC Zyrtec for seasonal allergies        Jaci Carrel, [place] 12/03/11 1554

## 2011-12-03 NOTE — ED Provider Notes (Signed)
Medical screening examination/treatment/procedure(s) were performed by non-physician practitioner and as supervising physician I was immediately available for consultation/collaboration.   Dayton Bailiff, MD 12/03/11 2342

## 2012-04-05 ENCOUNTER — Emergency Department (INDEPENDENT_AMBULATORY_CARE_PROVIDER_SITE_OTHER)
Admission: EM | Admit: 2012-04-05 | Discharge: 2012-04-05 | Disposition: A | Discharge status: Home / Self Care | Payer: Self-pay | Source: Home / Self Care | Attending: Emergency Medicine | Admitting: Emergency Medicine

## 2012-04-05 ENCOUNTER — Encounter (HOSPITAL_COMMUNITY): Payer: Self-pay

## 2012-04-05 DIAGNOSIS — J329 Chronic sinusitis, unspecified: Secondary | ICD-10-CM

## 2012-04-05 MED ORDER — FLUCONAZOLE 150 MG PO TABS: 150.0000 mg | ORAL_TABLET | Freq: Every day | ORAL | Status: AC

## 2012-04-05 MED ORDER — AMOXICILLIN 500 MG PO CAPS: 500.0000 mg | ORAL_CAPSULE | Freq: Three times a day (TID) | ORAL | Status: AC

## 2012-04-05 MED ORDER — FEXOFENADINE-PSEUDOEPHED ER 60-120 MG PO TB12: 1.0000 | ORAL_TABLET | Freq: Two times a day (BID) | ORAL | Status: DC

## 2012-04-05 NOTE — ED Notes (Signed)
Pt states she is having sinus pressure/pain and drainage down the back of her throat for 2 months. States drainage is making her have nausea.

## 2012-04-05 NOTE — ED Provider Notes (Addendum)
History     CSN: [id number]  Arrival date & time 04/05/12  1144   First MD Initiated Contact with Patient 04/05/12 1311      Chief Complaint  Patient presents with  . Facial Pain    sinus pressure and pain    (Consider location/radiation/quality/duration/timing/severity/associated sxs/prior treatment) HPI Comments: Patient presents urgent care complaining of sinus pressure and pain and drainage down her throat for about 2 months she has tried Sudafed over-the-counter another antihistamines but she's not feeling any better. The pressure is worse she points to both maxillary sinuses, popping sensations in both her years. Dripping in the back of her throat at times makes her nauseous. Patient denies any fevers, or other constitutional symptoms such as bodyaches, lack of appetite, denies other neurological symptoms such as weakness paresthesias or vertigo.  The history is provided by the patient.    History reviewed. No pertinent past medical history.  Past Surgical History  Procedure Date  . Cesarean section     No family history on file.  History  Substance Use Topics  . Smoking status: Never Smoker   . Smokeless tobacco: Never Used  . Alcohol Use: No    OB History    Grav Para Term Preterm Abortions TAB SAB Ect Mult Living                  Review of Systems  Constitutional: Negative for activity change and appetite change.  HENT: Positive for ear pain, congestion and sinus pressure. Negative for trouble swallowing, neck pain, neck stiffness, dental problem, voice change and tinnitus.   Eyes: Negative for photophobia, discharge and visual disturbance.  Respiratory: Negative for cough, choking and shortness of breath.   Cardiovascular: Negative for chest pain.  Skin: Negative for rash.  Neurological: Negative for dizziness, tremors, seizures and numbness.    Allergies  Review of patient's allergies indicates no known allergies.  Home Medications   Current  Outpatient Rx  Name Route Sig Dispense Refill  . AMOXICILLIN 500 MG PO CAPS Oral Take 1 capsule (500 mg total) by mouth 3 (three) times daily. 30 capsule 0  . CETIRIZINE-PSEUDOEPHEDRINE ER 5-120 MG PO TB12 Oral Take 1 tablet by mouth daily. 30 tablet 0  . FEXOFENADINE-PSEUDOEPHED ER 60-120 MG PO TB12 Oral Take 1 tablet by mouth 2 (two) times daily. 30 tablet 0  . FLUCONAZOLE 150 MG PO TABS Oral Take 1 tablet (150 mg total) by mouth daily. 2 tablet 0  . LEVONORGESTREL-ETHINYL ESTRAD 0.1-20 MG-MCG PO TABS Oral Take 1 tablet by mouth daily.      BP 104/67  Pulse 64  Temp(Src) 97.4 F (36.3 C) (Oral)  Resp 16  SpO2 99%  LMP 03/29/2012  Physical Exam  Nursing note and vitals reviewed. Constitutional: She appears well-developed and well-nourished.  HENT:  Head: Normocephalic.    Right Ear: Hearing and tympanic membrane normal.  Left Ear: Hearing and tympanic membrane normal.  Mouth/Throat: Uvula is midline and oropharynx is clear and moist. No oropharyngeal exudate, posterior oropharyngeal edema, posterior oropharyngeal erythema or tonsillar abscesses.  Eyes: Conjunctivae are normal.  Pulmonary/Chest: Effort normal and breath sounds normal.    ED Course  Procedures (including critical care time)  Labs Reviewed - No data to display No results found.   Maxillary sinusitis. Antihistamine course of 2 weeks with antibiotics for 10 days and instructed to followup with ENT Dr. if no improvement after treatment cycle   MDM  Recurrent sinusitis for 2 months. Patient seemed to  demonstrate focal to both maxillary regions. Should agree with followup care with referral ENT if symptoms persist or not fully resolved. She was afebrile and looks comfortable.        Jimmie Molly, MD 04/05/12 1328  Jimmie Molly, MD 04/05/12 1329

## 2012-04-05 NOTE — Discharge Instructions (Signed)
  If no improvement after 2 weeks after treatment followup with the ENT  Sinusitis Sinuses are air pockets within the bones of your face. The growth of bacteria within a sinus leads to infection. The infection prevents the sinuses from draining. This infection is called sinusitis. SYMPTOMS  There will be different areas of pain depending on which sinuses have become infected.  The maxillary sinuses often produce pain beneath the eyes.   Frontal sinusitis may cause pain in the middle of the forehead and above the eyes.  Other problems (symptoms) include:  Toothaches.   Colored, pus-like (purulent) drainage from the nose.   Swelling, warmth, and tenderness over the sinus areas may be signs of infection.  TREATMENT  Sinusitis is most often determined by an exam.X-rays may be taken. If x-rays have been taken, make sure you obtain your results or find out how you are to obtain them. Your caregiver may give you medications (antibiotics). These are medications that will help kill the bacteria causing the infection. You may also be given a medication (decongestant) that helps to reduce sinus swelling.  HOME CARE INSTRUCTIONS   Only take over-the-counter or prescription medicines for pain, discomfort, or fever as directed by your caregiver.   Drink extra fluids. Fluids help thin the mucus so your sinuses can drain more easily.   Applying either moist heat or ice packs to the sinus areas may help relieve discomfort.   Use saline nasal sprays to help moisten your sinuses. The sprays can be found at your local drugstore.  SEEK IMMEDIATE MEDICAL CARE IF:  You have a fever.   You have increasing pain, severe headaches, or toothache.   You have nausea, vomiting, or drowsiness.   You develop unusual swelling around the face or trouble seeing.  MAKE SURE YOU:   Understand these instructions.   Will watch your condition.   Will get help right away if you are not doing well or get worse.    Document Released: 10/15/2005 Document Revised: 10/04/2011 Document Reviewed: 05/14/2007 [facility] Patient Information 2012 [place], [place].

## 2012-04-25 ENCOUNTER — Emergency Department (HOSPITAL_COMMUNITY)
Admission: EM | Admit: 2012-04-25 | Discharge: 2012-04-25 | Disposition: A | Discharge status: Home / Self Care | Payer: Medicaid Other

## 2012-07-04 ENCOUNTER — Encounter (HOSPITAL_COMMUNITY): Payer: Self-pay | Admitting: Emergency Medicine

## 2012-07-04 ENCOUNTER — Emergency Department (HOSPITAL_COMMUNITY)
Admission: EM | Admit: 2012-07-04 | Discharge: 2012-07-04 | Disposition: A | Discharge status: Home / Self Care | Payer: Medicaid Other | Source: Home / Self Care | Attending: Emergency Medicine | Admitting: Emergency Medicine

## 2012-07-04 DIAGNOSIS — J069 Acute upper respiratory infection, unspecified: Secondary | ICD-10-CM

## 2012-07-04 DIAGNOSIS — K219 Gastro-esophageal reflux disease without esophagitis: Secondary | ICD-10-CM

## 2012-07-04 MED ORDER — CETIRIZINE-PSEUDOEPHEDRINE ER 5-120 MG PO TB12: 1.0000 | ORAL_TABLET | Freq: Two times a day (BID) | ORAL | Status: DC

## 2012-07-04 MED ORDER — SALINE NASAL SPRAY 0.65 % NA SOLN: 1.0000 | NASAL | Status: DC | PRN

## 2012-07-04 MED ORDER — OMEPRAZOLE 20 MG PO CPDR: 20.0000 mg | DELAYED_RELEASE_CAPSULE | Freq: Every day | ORAL | Status: DC

## 2012-07-04 NOTE — ED Provider Notes (Signed)
Medical screening examination/treatment/procedure(s) were performed by non-physician practitioner and as supervising physician I was immediately available for consultation/collaboration.  Leslee Home, M.D.   Reuben Likes, MD 07/04/12 [id number]

## 2012-07-04 NOTE — ED Notes (Signed)
Reports onset of symptoms yesterday.  Nagging cough, reports itchy/sore throat.  Reports drainage in throat.  Denies fever.  Reports headache/sinus pressure.

## 2012-07-04 NOTE — ED Provider Notes (Signed)
History     CSN: [id number]  Arrival date & time 07/04/12  1719   First MD Initiated Contact with Patient 07/04/12 1745      Chief Complaint  Patient presents with  . Cough  stomach pain with gastric reflux  (Consider location/radiation/quality/duration/timing/severity/associated sxs/prior treatment) HPI  Kellie Davila is a 26 y.o. female who complains of onset of cold symptoms for 1 day.   +sore throat and scratchy +cough, productive with clear mucus No pleuritic pain No wheezing +nasal congestion No post-nasal drainage + sinus pain/pressure No voice changes No chest congestion No itchy/red eyes No earache No hemoptysis No SOB +chills/sweats No fever + nausea for the past year, complains of epigastric burning sensation radiating to mid-back No vomiting No abdominal pain No diarrhea No skin rashes No fatigue + diffuse myalgias +headache  +ill contacts: son was sick yesterday and he is attending daycare  Tried OTC Sudafed last night with mild improvement of the sinus congestion Cough awaking at night. Requires one more pillow for congestion.   History reviewed. No pertinent past medical history.  Past Surgical History  Procedure Date  . Cesarean section     No family history on file.  History  Substance Use Topics  . Smoking status: Never Smoker   . Smokeless tobacco: Never Used  . Alcohol Use: No    OB History    Grav Para Term Preterm Abortions TAB SAB Ect Mult Living                  Review of Systems  Constitutional: Positive for chills.  HENT: Positive for congestion, rhinorrhea, neck pain and neck stiffness. Negative for ear pain.   Eyes: Negative.   Respiratory: Positive for cough. Negative for chest tightness, shortness of breath, wheezing and stridor.   Cardiovascular: Negative.     Allergies  Review of patient's allergies indicates no known allergies.  Home Medications   Current Outpatient Rx  Name Route Sig Dispense Refill  .  CETIRIZINE-PSEUDOEPHEDRINE ER 5-120 MG PO TB12 Oral Take 1 tablet by mouth 2 (two) times daily. 60 tablet 0  . FEXOFENADINE-PSEUDOEPHED ER 60-120 MG PO TB12 Oral Take 1 tablet by mouth 2 (two) times daily. 30 tablet 0  . LEVONORGESTREL-ETHINYL ESTRAD 0.1-20 MG-MCG PO TABS Oral Take 1 tablet by mouth daily.    Marland Kitchen OMEPRAZOLE 20 MG PO CPDR Oral Take 1 capsule (20 mg total) by mouth daily. 30 capsule 2  . SALINE NASAL SPRAY 0.65 % NA SOLN Nasal Place 1 spray into the nose as needed for congestion. 30 mL 12    BP 106/71  Pulse 76  Temp 98.7 F (37.1 C) (Oral)  Resp 16  SpO2 100%  Physical Exam  Nursing note and vitals reviewed. Constitutional: She is oriented to person, place, and time. Vital signs are normal. She appears well-developed and well-nourished. She is active and cooperative.  HENT:  Head: Normocephalic.  Right Ear: Hearing, external ear and ear canal normal. Tympanic membrane is bulging.  Left Ear: Hearing, external ear and ear canal normal.  Nose: Right sinus exhibits maxillary sinus tenderness. Right sinus exhibits no frontal sinus tenderness. Left sinus exhibits maxillary sinus tenderness. Left sinus exhibits no frontal sinus tenderness.  Mouth/Throat: Uvula is midline, oropharynx is clear and moist and mucous membranes are normal. No oropharyngeal exudate, posterior oropharyngeal edema or posterior oropharyngeal erythema.       Cerumen impaction on L side, no visualization possible of TM  Eyes: Conjunctivae are normal. Pupils are equal,  round, and reactive to light. No scleral icterus.  Neck: Trachea normal. Neck supple.  Cardiovascular: Normal rate, regular rhythm and normal heart sounds.   Pulmonary/Chest: Effort normal and breath sounds normal. She exhibits no tenderness.  Abdominal: Soft. Normal appearance and bowel sounds are normal. There is no hepatosplenomegaly. There is no tenderness. There is no rebound.  Lymphadenopathy:       Head (right side): No submental, no  submandibular, no tonsillar, no preauricular, no posterior auricular and no occipital adenopathy present.       Head (left side): No submental, no submandibular, no tonsillar, no preauricular, no posterior auricular and no occipital adenopathy present.    She has no cervical adenopathy.  Neurological: She is alert and oriented to person, place, and time. No cranial nerve deficit or sensory deficit.  Skin: Skin is warm, dry and intact. No rash noted.  Psychiatric: She has a normal mood and affect. Her speech is normal and behavior is normal. Judgment and thought content normal. Cognition and memory are normal.    ED Course  Procedures (including critical care time)   Labs Reviewed  POCT H PYLORI SCREEN   No results found.   1. URI (upper respiratory infection)   2. Reflux       MDM  H. Pylori negative.  Take medication as prescribed.  Return for symptoms not improved.          Johnsie Kindred, NP 07/04/12 1958

## 2012-08-17 ENCOUNTER — Inpatient Hospital Stay (HOSPITAL_COMMUNITY)
Admission: AD | Admit: 2012-08-17 | Discharge: 2012-08-17 | Disposition: A | Discharge status: Home / Self Care | Payer: Medicaid Other | Source: Ambulatory Visit | Attending: Obstetrics and Gynecology | Admitting: Obstetrics and Gynecology

## 2012-08-17 ENCOUNTER — Encounter (HOSPITAL_COMMUNITY): Payer: Self-pay | Admitting: *Deleted

## 2012-08-17 ENCOUNTER — Encounter (HOSPITAL_COMMUNITY): Payer: Self-pay | Admitting: Obstetrics and Gynecology

## 2012-08-17 DIAGNOSIS — L293 Anogenital pruritus, unspecified: Secondary | ICD-10-CM | POA: Insufficient documentation

## 2012-08-17 DIAGNOSIS — R109 Unspecified abdominal pain: Secondary | ICD-10-CM | POA: Insufficient documentation

## 2012-08-17 DIAGNOSIS — N760 Acute vaginitis: Secondary | ICD-10-CM

## 2012-08-17 DIAGNOSIS — R300 Dysuria: Secondary | ICD-10-CM | POA: Insufficient documentation

## 2012-08-17 DIAGNOSIS — N926 Irregular menstruation, unspecified: Secondary | ICD-10-CM

## 2012-08-17 DIAGNOSIS — N949 Unspecified condition associated with female genital organs and menstrual cycle: Secondary | ICD-10-CM | POA: Insufficient documentation

## 2012-08-17 LAB — URINALYSIS, ROUTINE W REFLEX MICROSCOPIC
Bilirubin Urine: NEGATIVE
Glucose, UA: NEGATIVE mg/dL
Hgb urine dipstick: NEGATIVE
Ketones, ur: NEGATIVE mg/dL
Leukocytes, UA: NEGATIVE
pH: 8 (ref 5.0–8.0)

## 2012-08-17 LAB — WET PREP, GENITAL
Trich, Wet Prep: NONE SEEN
Yeast Wet Prep HPF POC: NONE SEEN

## 2012-08-17 MED ORDER — METRONIDAZOLE 0.75 % VA GEL: 1.0000 | Freq: Every day | VAGINAL | Status: DC

## 2012-08-17 NOTE — Progress Notes (Signed)
Pt informed that her provider would be in to see her. Pt appear agreeable with information provided

## 2012-08-17 NOTE — MAU Note (Signed)
Pt LMP 08/03/2012 having lower abd pain x 1 month, vag itching x 1 week and pain with urination x 3-4 days.

## 2012-08-17 NOTE — MAU Note (Signed)
Pt states she has been having Vaginal itching for about 2wks. Pt also admits to discharge

## 2012-08-17 NOTE — Progress Notes (Signed)
Pt states sometimes discharge feels watery and sometimes it feels thicker

## 2012-08-17 NOTE — MAU Note (Signed)
Pt reports having  A vaginal itching and pressure x 2 weeks and pain with urination that started 4 days ago. Has tried OTC creams without relief.

## 2012-08-17 NOTE — MAU Note (Signed)
"  I've had vaginal itching and pain for about 1 week.  I also have pressure when I urinate.  No burning.  The abdominal pain comes every once in a while.  It sharp & crampy.I was here earlier and waited in the room about 2-2.5 hours without being seen.  They said the midwife was delivering a baby.  No N/V/D associated with this."

## 2012-08-17 NOTE — Progress Notes (Signed)
Pt last seen in [facility] for annual pap

## 2012-08-17 NOTE — MAU Provider Note (Signed)
History     CSN: [id number]  Arrival date and time: 08/17/12 1918   First Provider Initiated Contact with Patient 08/17/12 2040      Chief Complaint  Patient presents with  . Abdominal Pain  . Vaginal Itching  . Dysuria   HPI Comments: Pt is a 25yo G1P1 w cc of vaginal itching x2 weeks, cramping and more recently pelvic pressure w urination. Pt states she's noticed a watery d/c. She states she tried OTC yeast infection tx before her cycle on 10-6, and had improvement, now it has returned.  Denies any hx of STD's, States menses are regular. Denies any medical problems.   Abdominal Pain Associated symptoms include dysuria and frequency.  Vaginal Itching Associated symptoms include abdominal pain, dysuria and frequency. Pertinent negatives include no flank pain or urgency.  Dysuria  Associated symptoms include frequency. Pertinent negatives include no flank pain or urgency.      Past Medical History  Diagnosis Date  . Complication of anesthesia   . No pertinent past medical history     Past Surgical History  Procedure Date  . Cesarean section     Family History  Problem Relation Age of Onset  . Alcohol abuse Neg Hx   . Arthritis Neg Hx   . Asthma Neg Hx   . Birth defects Neg Hx   . Cancer Neg Hx   . COPD Neg Hx   . Depression Neg Hx   . Diabetes Neg Hx   . Drug abuse Neg Hx   . Early death Neg Hx   . Hearing loss Neg Hx   . Heart disease Neg Hx   . Hyperlipidemia Neg Hx   . Hypertension Neg Hx   . Kidney disease Neg Hx   . Learning disabilities Neg Hx   . Mental illness Neg Hx   . Mental retardation Neg Hx   . Miscarriages / Stillbirths Neg Hx   . Stroke Neg Hx   . Vision loss Neg Hx     History  Substance Use Topics  . Smoking status: Never Smoker   . Smokeless tobacco: Never Used  . Alcohol Use: No    Allergies: No Known Allergies  Prescriptions prior to admission  Medication Sig Dispense Refill  . cetirizine-pseudoephedrine (ZYRTEC-D) 5-120  MG per tablet Take 1 tablet by mouth 2 (two) times daily.  60 tablet  0  . omeprazole (PRILOSEC) 20 MG capsule Take 1 capsule (20 mg total) by mouth daily.  30 capsule  2    Review of Systems  Gastrointestinal: Positive for abdominal pain.  Genitourinary: Positive for dysuria and frequency. Negative for urgency and flank pain.       Vaginal itching Watery d/c   All other systems reviewed and are negative.   Physical Exam   Blood pressure 113/59, pulse 86, temperature 97.4 F (36.3 C), temperature source Oral, resp. rate 16, height 5\' 2"  (1.575 m), weight 126 lb 9.6 oz (57.425 kg), last menstrual period 08/03/2012, not currently breastfeeding.  Physical Exam  Nursing note and vitals reviewed. Constitutional: She is oriented to person, place, and time. She appears well-developed and well-nourished.  HENT:  Head: Normocephalic.  Eyes: Pupils are equal, round, and reactive to light.  Neck: Normal range of motion.  Cardiovascular: Normal rate.   Respiratory: Effort normal.  GI: Soft. Bowel sounds are normal. She exhibits no distension and no mass. There is no tenderness. There is no rebound and no guarding.  Genitourinary: Vaginal discharge found.  EGBU WNL   lg amt clear watery d/c  Cervix pink  Vaginal walls pink Neg CMT No masses,  Uterus WNL    Musculoskeletal: Normal range of motion.  Neurological: She is alert and oriented to person, place, and time.  Skin: Skin is warm and dry.  Psychiatric: She has a normal mood and affect. Her behavior is normal.    MAU Course  Procedures    Assessment and Plan  UPT neg UA neg, will send for cx Wet prep - pending GC/CT sent  Will await wet prep results   Juda Lajeunesse M 08/17/2012, 9:12 PM   Wet prep +clue cells RX metro gel per pt preference F/u routine or if sx's worsen D/c home stable condition

## 2012-08-18 LAB — GC/CHLAMYDIA PROBE AMP, GENITAL: Chlamydia, DNA Probe: NEGATIVE

## 2012-08-19 LAB — URINE CULTURE: Colony Count: NO GROWTH

## 2012-09-03 ENCOUNTER — Emergency Department (HOSPITAL_COMMUNITY)
Admission: EM | Admit: 2012-09-03 | Discharge: 2012-09-03 | Disposition: A | Discharge status: Home / Self Care | Payer: Medicaid Other | Source: Home / Self Care | Attending: Emergency Medicine | Admitting: Emergency Medicine

## 2012-09-03 ENCOUNTER — Encounter (HOSPITAL_COMMUNITY): Payer: Self-pay

## 2012-09-03 DIAGNOSIS — J309 Allergic rhinitis, unspecified: Secondary | ICD-10-CM

## 2012-09-03 DIAGNOSIS — J029 Acute pharyngitis, unspecified: Secondary | ICD-10-CM

## 2012-09-03 DIAGNOSIS — J019 Acute sinusitis, unspecified: Secondary | ICD-10-CM

## 2012-09-03 LAB — POCT RAPID STREP A: Streptococcus, Group A Screen (Direct): NEGATIVE

## 2012-09-03 MED ORDER — FLUTICASONE PROPIONATE 50 MCG/ACT NA SUSP: 2.0000 | Freq: Every day | NASAL | Status: DC

## 2012-09-03 MED ORDER — FLUCONAZOLE 150 MG PO TABS: 150.0000 mg | ORAL_TABLET | Freq: Once | ORAL | Status: DC

## 2012-09-03 MED ORDER — CEFDINIR 300 MG PO CAPS: 300.0000 mg | ORAL_CAPSULE | Freq: Two times a day (BID) | ORAL | Status: DC

## 2012-09-03 MED ORDER — TRAMADOL HCL 50 MG PO TABS: 100.0000 mg | ORAL_TABLET | Freq: Three times a day (TID) | ORAL | Status: DC | PRN

## 2012-09-03 MED ORDER — PREDNISONE 5 MG PO KIT: 1.0000 | PACK | Freq: Every day | ORAL | Status: DC

## 2012-09-03 MED ORDER — FEXOFENADINE-PSEUDOEPHED ER 60-120 MG PO TB12: 1.0000 | ORAL_TABLET | Freq: Two times a day (BID) | ORAL | Status: DC

## 2012-09-03 NOTE — ED Notes (Addendum)
Patient complains of sore throat, facial pain and headache (forehead and temple area), cough (productive-green/bloody) bilateral ear pain x 2 days

## 2012-09-03 NOTE — Discharge Instructions (Signed)

## 2012-09-03 NOTE — ED Provider Notes (Signed)
Chief Complaint  Patient presents with  . Sore Throat    History of Present Illness:   Kellie Davila is a 26 year old female who presents today with a two-day history of severe sore throat, pain with swallowing and postnasal drip. She denies any hoarseness. She also has had a cough productive of green sputum, chest tightness, headache, aching in her ears, nasal congestion, sneezing, and nausea. She denies any fever, chills, or GI complaints. She is breast-feeding. She has a history of long-standing problems with her sinuses, probably allergic rhinitis. She has trouble getting in to see her primary care physician. She does not have any medications that she's taking for this right now. She states she gets frequent sinus infections whenever she has a respiratory infection like this.  Review of Systems:  Other than noted above, the patient denies any of the following symptoms. Systemic:  No fever, chills, sweats, fatigue, myalgias, headache, or anorexia. Eye:  No redness, pain or drainage. ENT:  No earache, ear congestion, nasal congestion, sneezing, rhinorrhea, sinus pressure, sinus pain, post nasal drip, or sore throat. Lungs:  No cough, sputum production, wheezing, shortness of breath, or chest pain. GI:  No abdominal pain, nausea, vomiting, or diarrhea.  PMFSH:  Past medical history, family history, social history, meds, and allergies were reviewed.  Physical Exam:   Vital signs:  BP 102/65  Pulse 64  Temp 98 F (36.7 C) (Oral)  Resp 16  SpO2 100%  LMP 08/03/2012  Breastfeeding? No General:  Alert, in no distress. Eye:  No conjunctival injection or drainage. Lids were normal. ENT:  TMs and canals were normal, without erythema or inflammation.  Nasal mucosa was congested, pale, and boggy, without drainage.  Mucous membranes were moist.  Pharynx was mildly erythematous, without exudate or drainage.  There were no oral ulcerations or lesions. Neck:  Supple, no adenopathy, tenderness or  mass. Lungs:  No respiratory distress.  Lungs were clear to auscultation, without wheezes, rales or rhonchi.  Breath sounds were clear and equal bilaterally.  Heart:  Regular rhythm, without gallops, murmers or rubs. Skin:  Clear, warm, and dry, without rash or lesions.  Labs:   Results for orders placed during the hospital encounter of 09/03/12  POCT RAPID STREP A ([facility] ONLY)      Component Value Range   Streptococcus, Group A Screen (Direct) NEGATIVE  NEGATIVE   Assessment:  The primary encounter diagnosis was Viral pharyngitis. Diagnoses of Allergic rhinitis and Acute sinusitis were also pertinent to this visit.  Plan:   1.  The following meds were prescribed:   New Prescriptions   CEFDINIR (OMNICEF) 300 MG CAPSULE    Take 1 capsule (300 mg total) by mouth 2 (two) times daily.   FEXOFENADINE-PSEUDOEPHEDRINE (ALLEGRA-D) 60-120 MG PER TABLET    Take 1 tablet by mouth every 12 (twelve) hours.   FLUCONAZOLE (DIFLUCAN) 150 MG TABLET    Take 1 tablet (150 mg total) by mouth once.   FLUTICASONE (FLONASE) 50 MCG/ACT NASAL SPRAY    Place 2 sprays into the nose daily.   PREDNISONE 5 MG KIT    Take 1 kit (5 mg total) by mouth daily after breakfast. Prednisone 5 mg 6 day dosepack.  Take as directed.   TRAMADOL (ULTRAM) 50 MG TABLET    Take 2 tablets (100 mg total) by mouth every 8 (eight) hours as needed for pain.   2.  The patient was instructed in symptomatic care and handouts were given. Since the patient is  breast-feeding, I think his best that she pump and discard her milk as long she's taking these medications. She states she's okay with that since her son is now 29 years old and she's thinking of weaning him anyway. 3.  The patient was told to return if becoming worse in any way, if no better in 3 or 4 days, and given some red flag symptoms that would indicate earlier return.  The patient was told not to get the prescription for antibiotic filled unless her respiratory symptoms had  persisted for more than 7 to 10 days.     Reuben Likes, MD 09/03/12 [phone]

## 2012-09-09 ENCOUNTER — Emergency Department (HOSPITAL_COMMUNITY)
Admission: EM | Admit: 2012-09-09 | Discharge: 2012-09-09 | Disposition: A | Discharge status: Home / Self Care | Payer: Medicaid Other | Source: Home / Self Care | Attending: Family Medicine | Admitting: Family Medicine

## 2012-09-09 ENCOUNTER — Encounter (HOSPITAL_COMMUNITY): Payer: Self-pay | Admitting: Emergency Medicine

## 2012-09-09 DIAGNOSIS — J069 Acute upper respiratory infection, unspecified: Secondary | ICD-10-CM

## 2012-09-09 LAB — POCT RAPID STREP A: Streptococcus, Group A Screen (Direct): NEGATIVE

## 2012-09-09 MED ORDER — HYDROCOD POLST-CHLORPHEN POLST 10-8 MG/5ML PO LQCR
5.0000 mL | Freq: Two times a day (BID) | ORAL | Status: DC
Start: 2012-09-09 — End: 2012-11-12

## 2012-09-09 MED ORDER — IPRATROPIUM BROMIDE 0.06 % NA SOLN: 2.0000 | Freq: Four times a day (QID) | NASAL | Status: DC

## 2012-09-09 NOTE — ED Provider Notes (Signed)
History     CSN: [id number]  Arrival date & time 09/09/12  1021   First MD Initiated Contact with Patient 09/09/12 1126      Chief Complaint  Patient presents with  . URI    (Consider location/radiation/quality/duration/timing/severity/associated sxs/prior treatment) Patient is a 26 y.o. female presenting with cough. The history is provided by the patient.  Cough This is a new problem. The current episode started more than 1 week ago. The problem has not changed (seen here at [facility] on 11/6 and given cefdinir, sx continue.) since onset.The cough is non-productive. There has been no fever. Associated symptoms include rhinorrhea. Pertinent negatives include no sore throat. She is not a smoker.    Past Medical History  Diagnosis Date  . Complication of anesthesia   . No pertinent past medical history     Past Surgical History  Procedure Date  . Cesarean section     Family History  Problem Relation Age of Onset  . Alcohol abuse Neg Hx   . Arthritis Neg Hx   . Asthma Neg Hx   . Birth defects Neg Hx   . Cancer Neg Hx   . COPD Neg Hx   . Depression Neg Hx   . Diabetes Neg Hx   . Drug abuse Neg Hx   . Early death Neg Hx   . Hearing loss Neg Hx   . Heart disease Neg Hx   . Hyperlipidemia Neg Hx   . Hypertension Neg Hx   . Kidney disease Neg Hx   . Learning disabilities Neg Hx   . Mental illness Neg Hx   . Mental retardation Neg Hx   . Miscarriages / Stillbirths Neg Hx   . Stroke Neg Hx   . Vision loss Neg Hx     History  Substance Use Topics  . Smoking status: Never Smoker   . Smokeless tobacco: Never Used  . Alcohol Use: No    OB History    Grav Para Term Preterm Abortions TAB SAB Ect Mult Living   1 1 1       1       Review of Systems  Constitutional: Negative.   HENT: Positive for congestion, rhinorrhea and postnasal drip. Negative for sore throat.   Respiratory: Positive for cough.   Gastrointestinal: Negative.   Skin: Negative.     Allergies    Review of patient's allergies indicates no known allergies.  Home Medications   Current Outpatient Rx  Name  Route  Sig  Dispense  Refill  . CEFDINIR 300 MG PO CAPS   Oral   Take 1 capsule (300 mg total) by mouth 2 (two) times daily.   20 capsule   0   . CETIRIZINE-PSEUDOEPHEDRINE ER 5-120 MG PO TB12   Oral   Take 1 tablet by mouth 2 (two) times daily.   60 tablet   0   . FEXOFENADINE-PSEUDOEPHED ER 60-120 MG PO TB12   Oral   Take 1 tablet by mouth every 12 (twelve) hours.   30 tablet   0   . FLUTICASONE PROPIONATE 50 MCG/ACT NA SUSP   Nasal   Place 2 sprays into the nose daily.   16 g   0   . HYDROCOD POLST-CPM POLST ER 10-8 MG/5ML PO LQCR   Oral   Take 5 mLs by mouth every 12 (twelve) hours.   115 mL   0   . FLUCONAZOLE 150 MG PO TABS   Oral   Take  1 tablet (150 mg total) by mouth once.   1 tablet   5   . IPRATROPIUM BROMIDE 0.06 % NA SOLN   Nasal   Place 2 sprays into the nose 4 (four) times daily.   15 mL   12   . METRONIDAZOLE 0.75 % VA GEL   Vaginal   Place 1 Applicatorful vaginally at bedtime. Place vaginally at bedtime for  5 nights   70 g   0   . OMEPRAZOLE 20 MG PO CPDR   Oral   Take 1 capsule (20 mg total) by mouth daily.   30 capsule   2   . PREDNISONE 5 MG PO KIT   Oral   Take 1 kit (5 mg total) by mouth daily after breakfast. Prednisone 5 mg 6 day dosepack.  Take as directed.   1 kit   0   . TRAMADOL HCL 50 MG PO TABS   Oral   Take 2 tablets (100 mg total) by mouth every 8 (eight) hours as needed for pain.   30 tablet   0     BP 109/67  Pulse 72  Temp 98.3 F (36.8 C) (Oral)  Resp 18  SpO2 100%  LMP 08/03/2012  Physical Exam  Nursing note and vitals reviewed. Constitutional: She is oriented to person, place, and time. She appears well-developed and well-nourished.  HENT:  Head: Normocephalic.  Right Ear: External ear normal.  Left Ear: External ear normal.  Nose: Mucosal edema and rhinorrhea present.   Mouth/Throat: Oropharynx is clear and moist. No oropharyngeal exudate or posterior oropharyngeal erythema.  Cardiovascular: Regular rhythm, normal heart sounds and intact distal pulses.   Pulmonary/Chest: Breath sounds normal.  Neurological: She is alert and oriented to person, place, and time.  Skin: Skin is warm and dry.    ED Course  Procedures (including critical care time)   Labs Reviewed  POCT RAPID STREP A ([facility] ONLY)   No results found.   1. URI (upper respiratory infection)       MDM          Linna Hoff, MD 09/09/12 1255

## 2012-09-09 NOTE — ED Notes (Signed)
Pt c/o cold sx... Was seen last week here at the [facility] and was given Cefdinir for 10 days... Has not finished the antibiotics... Sx today include: dry cough, sore throat... Denies: fevers, vomiting, nauseas, diarrhea... Pt is alert w/no signs of distress.

## 2012-11-05 ENCOUNTER — Encounter (HOSPITAL_COMMUNITY): Payer: Self-pay | Admitting: *Deleted

## 2012-11-05 ENCOUNTER — Emergency Department (INDEPENDENT_AMBULATORY_CARE_PROVIDER_SITE_OTHER)
Admission: EM | Admit: 2012-11-05 | Discharge: 2012-11-05 | Disposition: A | Discharge status: Home / Self Care | Payer: Medicaid Other | Source: Home / Self Care | Attending: Family Medicine | Admitting: Family Medicine

## 2012-11-05 DIAGNOSIS — J329 Chronic sinusitis, unspecified: Secondary | ICD-10-CM

## 2012-11-05 MED ORDER — ONDANSETRON HCL 4 MG PO TABS: 4.0000 mg | ORAL_TABLET | Freq: Four times a day (QID) | ORAL | Status: DC

## 2012-11-05 MED ORDER — DOXYCYCLINE HYCLATE 100 MG PO CAPS: 100.0000 mg | ORAL_CAPSULE | Freq: Two times a day (BID) | ORAL | Status: DC

## 2012-11-05 MED ORDER — MOMETASONE FUROATE 50 MCG/ACT NA SUSP: 2.0000 | Freq: Every day | NASAL | Status: DC

## 2012-11-05 NOTE — ED Provider Notes (Signed)
History     CSN: [id number]  Arrival date & time 11/05/12  1347   First MD Initiated Contact with Patient 11/05/12 1532      Chief Complaint  Patient presents with  . Headache    (Consider location/radiation/quality/duration/timing/severity/associated sxs/prior treatment) Patient is a 27 y.o. female presenting with headaches. The history is provided by the patient.  Headache The primary symptoms include headaches and nausea. The symptoms began more than 1 week ago (sx related to over 1 yr,). The symptoms are unchanged. Focality: occipital neck.  The headache is not associated with photophobia, neck stiffness or weakness.  Additional symptoms do not include neck stiffness, weakness, photophobia, vertigo or anxiety. Associated symptoms comments: No fam h/o headaches..    Past Medical History  Diagnosis Date  . Complication of anesthesia   . No pertinent past medical history     Past Surgical History  Procedure Date  . Cesarean section     Family History  Problem Relation Age of Onset  . Alcohol abuse Neg Hx   . Arthritis Neg Hx   . Asthma Neg Hx   . Birth defects Neg Hx   . Cancer Neg Hx   . COPD Neg Hx   . Depression Neg Hx   . Diabetes Neg Hx   . Drug abuse Neg Hx   . Early death Neg Hx   . Hearing loss Neg Hx   . Heart disease Neg Hx   . Hyperlipidemia Neg Hx   . Hypertension Neg Hx   . Kidney disease Neg Hx   . Learning disabilities Neg Hx   . Mental illness Neg Hx   . Mental retardation Neg Hx   . Miscarriages / Stillbirths Neg Hx   . Stroke Neg Hx   . Vision loss Neg Hx     History  Substance Use Topics  . Smoking status: Never Smoker   . Smokeless tobacco: Never Used  . Alcohol Use: No    OB History    Grav Para Term Preterm Abortions TAB SAB Ect Mult Living   1 1 1       1       Review of Systems  Constitutional: Negative.   HENT: Negative for congestion, rhinorrhea and neck stiffness.   Eyes: Negative for photophobia.  Gastrointestinal:  Positive for nausea.  Skin: Negative.   Neurological: Positive for headaches. Negative for vertigo and weakness.    Allergies  Review of patient's allergies indicates no known allergies.  Home Medications   Current Outpatient Rx  Name  Route  Sig  Dispense  Refill  . CEFDINIR 300 MG PO CAPS   Oral   Take 1 capsule (300 mg total) by mouth 2 (two) times daily.   20 capsule   0   . CETIRIZINE-PSEUDOEPHEDRINE ER 5-120 MG PO TB12   Oral   Take 1 tablet by mouth 2 (two) times daily.   60 tablet   0   . HYDROCOD POLST-CPM POLST ER 10-8 MG/5ML PO LQCR   Oral   Take 5 mLs by mouth every 12 (twelve) hours.   115 mL   0   . DOXYCYCLINE HYCLATE 100 MG PO CAPS   Oral   Take 1 capsule (100 mg total) by mouth 2 (two) times daily.   30 capsule   0   . FEXOFENADINE-PSEUDOEPHED ER 60-120 MG PO TB12   Oral   Take 1 tablet by mouth every 12 (twelve) hours.   30 tablet  0   . FLUCONAZOLE 150 MG PO TABS   Oral   Take 1 tablet (150 mg total) by mouth once.   1 tablet   5   . FLUTICASONE PROPIONATE 50 MCG/ACT NA SUSP   Nasal   Place 2 sprays into the nose daily.   16 g   0   . IPRATROPIUM BROMIDE 0.06 % NA SOLN   Nasal   Place 2 sprays into the nose 4 (four) times daily.   15 mL   12   . METRONIDAZOLE 0.75 % VA GEL   Vaginal   Place 1 Applicatorful vaginally at bedtime. Place vaginally at bedtime for  5 nights   70 g   0   . MOMETASONE FUROATE 50 MCG/ACT NA SUSP   Nasal   Place 2 sprays into the nose daily.   17 g   1   . OMEPRAZOLE 20 MG PO CPDR   Oral   Take 1 capsule (20 mg total) by mouth daily.   30 capsule   2   . ONDANSETRON HCL 4 MG PO TABS   Oral   Take 1 tablet (4 mg total) by mouth every 6 (six) hours. As needed for n/v   12 tablet   0   . PREDNISONE 5 MG PO KIT   Oral   Take 1 kit (5 mg total) by mouth daily after breakfast. Prednisone 5 mg 6 day dosepack.  Take as directed.   1 kit   0   . TRAMADOL HCL 50 MG PO TABS   Oral   Take 2  tablets (100 mg total) by mouth every 8 (eight) hours as needed for pain.   30 tablet   0     BP 104/64  Pulse 64  Temp 98.7 F (37.1 C) (Oral)  Resp 16  SpO2 100%  Physical Exam  Nursing note and vitals reviewed. Constitutional: She is oriented to person, place, and time. She appears well-developed and well-nourished.  HENT:  Head: Normocephalic.  Right Ear: External ear normal.  Left Ear: External ear normal.  Mouth/Throat: Oropharynx is clear and moist.  Eyes: Conjunctivae normal and EOM are normal. Pupils are equal, round, and reactive to light.  Neck: Normal range of motion. Neck supple.  Lymphadenopathy:    She has no cervical adenopathy.  Neurological: She is alert and oriented to person, place, and time. No cranial nerve deficit. Coordination normal.  Skin: Skin is warm and dry.  Psychiatric: She has a normal mood and affect.    ED Course  Procedures (including critical care time)  Labs Reviewed - No data to display No results found.   1. Sinusitis, chronic       MDM          Linna Hoff, MD 11/05/12 1625

## 2012-11-05 NOTE — ED Notes (Signed)
Pt called in all waiting areas x 1; notified by front desk that pt went to her car to lay down but doesn't know what car she went to so we cant go get her.  Will wait on pt to come back into [facility].

## 2012-11-05 NOTE — ED Notes (Signed)
Pt  Reports  Symptoms  Of  Headache   With  Nausea         Symptoms  X  approx  1  Month    She  Also   Reports   Symptoms  Not  releived  By otc  meds

## 2012-11-12 ENCOUNTER — Encounter (HOSPITAL_COMMUNITY): Payer: Self-pay | Admitting: Emergency Medicine

## 2012-11-12 ENCOUNTER — Emergency Department (HOSPITAL_COMMUNITY)
Admission: EM | Admit: 2012-11-12 | Discharge: 2012-11-12 | Disposition: A | Discharge status: Home / Self Care | Payer: Medicaid Other | Attending: Emergency Medicine | Admitting: Emergency Medicine

## 2012-11-12 ENCOUNTER — Emergency Department (HOSPITAL_COMMUNITY): Payer: Medicaid Other

## 2012-11-12 DIAGNOSIS — R51 Headache: Secondary | ICD-10-CM | POA: Insufficient documentation

## 2012-11-12 DIAGNOSIS — R110 Nausea: Secondary | ICD-10-CM | POA: Insufficient documentation

## 2012-11-12 DIAGNOSIS — Z79899 Other long term (current) drug therapy: Secondary | ICD-10-CM | POA: Insufficient documentation

## 2012-11-12 MED ORDER — METOCLOPRAMIDE HCL 5 MG/ML IJ SOLN
10.0000 mg | Freq: Once | INTRAMUSCULAR | Status: AC
  Administered 2012-11-12: 10 mg via INTRAVENOUS
  Filled 2012-11-12: qty 2

## 2012-11-12 MED ORDER — SODIUM CHLORIDE 0.9 % IV SOLN
INTRAVENOUS | Status: DC
Start: 2012-11-12 — End: 2012-11-12
  Administered 2012-11-12: 18:00:00 via INTRAVENOUS

## 2012-11-12 NOTE — ED Provider Notes (Signed)
History     CSN: [id number]  Arrival date & time 11/12/12  1637   First MD Initiated Contact with Patient 11/12/12 1657      Chief Complaint  Patient presents with  . Headache    (Consider location/radiation/quality/duration/timing/severity/associated sxs/prior treatment) HPI Comments: This is a 27 year old female, who presents emergency department with chief complaint of headache. Patient states that she has had a chronic headache for the past year. She states that it is associated with nausea. Patient states that her pain is moderate to severe. She states that she has been seen several times and treated for sinusitis, but with no relief. Patient denies having any imaging of her head done. She states that she has been unable to followup with ENT or neurology due to insurance problems. Patient denies any focal neurologic deficits, denies chest pain, shortness of breath, diarrhea, and constipation.  The history is provided by the patient. No language interpreter was used.    Past Medical History  Diagnosis Date  . Complication of anesthesia   . No pertinent past medical history     Past Surgical History  Procedure Date  . Cesarean section     Family History  Problem Relation Age of Onset  . Alcohol abuse Neg Hx   . Arthritis Neg Hx   . Asthma Neg Hx   . Birth defects Neg Hx   . Cancer Neg Hx   . COPD Neg Hx   . Depression Neg Hx   . Diabetes Neg Hx   . Drug abuse Neg Hx   . Early death Neg Hx   . Hearing loss Neg Hx   . Heart disease Neg Hx   . Hyperlipidemia Neg Hx   . Hypertension Neg Hx   . Kidney disease Neg Hx   . Learning disabilities Neg Hx   . Mental illness Neg Hx   . Mental retardation Neg Hx   . Miscarriages / Stillbirths Neg Hx   . Stroke Neg Hx   . Vision loss Neg Hx     History  Substance Use Topics  . Smoking status: Never Smoker   . Smokeless tobacco: Never Used  . Alcohol Use: No    OB History    Grav Para Term Preterm Abortions TAB SAB  Ect Mult Living   1 1 1       1       Review of Systems  All other systems reviewed and are negative.    Allergies  Review of patient's allergies indicates no known allergies.  Home Medications   Current Outpatient Rx  Name  Route  Sig  Dispense  Refill  . DOXYCYCLINE HYCLATE 100 MG PO CAPS   Oral   Take 100 mg by mouth 2 (two) times daily. 14 day course of therapy; started 11/05/12         . IBUPROFEN 200 MG PO TABS   Oral   Take 400 mg by mouth every 8 (eight) hours as needed. For pain.         Marland Kitchen ONDANSETRON HCL 4 MG PO TABS   Oral   Take 1 tablet (4 mg total) by mouth every 6 (six) hours. As needed for n/v   12 tablet   0     BP 109/55  Pulse 76  Temp 97.8 F (36.6 C) (Oral)  Resp 14  SpO2 100%  LMP 10/26/2012  Physical Exam  Nursing note and vitals reviewed. Constitutional: She is oriented to person, place, and  time. She appears well-developed and well-nourished.  HENT:  Head: Normocephalic and atraumatic.  Right Ear: External ear normal.  Left Ear: External ear normal.  Eyes: Conjunctivae normal and EOM are normal. Pupils are equal, round, and reactive to light.       No papilledema  Neck: Normal range of motion. Neck supple.       No pain with neck flexion, no meningismus  Cardiovascular: Normal rate, regular rhythm and normal heart sounds.  Exam reveals no gallop and no friction rub.   No murmur heard. Pulmonary/Chest: Effort normal and breath sounds normal. No respiratory distress. She has no wheezes. She has no rales. She exhibits no tenderness.  Abdominal: Soft. Bowel sounds are normal. She exhibits no distension and no mass. There is no tenderness. There is no rebound and no guarding.  Musculoskeletal: Normal range of motion. She exhibits no edema and no tenderness.       Normal gait.  Neurological: She is alert and oriented to person, place, and time. She has normal reflexes.       CN 3-12 intact, no pronator drift, normal shin to heel, normal  RAM, sensation and strength intact bilaterally.  Skin: Skin is warm and dry.  Psychiatric: She has a normal mood and affect. Her behavior is normal. Judgment and thought content normal.    ED Course  Procedures (including critical care time)  Results for orders placed during the hospital encounter of 09/09/12  POCT RAPID STREP A ([facility] ONLY)      Component Value Range   Streptococcus, Group A Screen (Direct) NEGATIVE  NEGATIVE   Ct Head Wo Contrast  11/12/2012  *RADIOLOGY REPORT*  Clinical Data: Headache, nausea.  CT HEAD WITHOUT CONTRAST  Technique:  Contiguous axial images were obtained from the base of the skull through the vertex without contrast.  Comparison: 07/07/2003 by report only  Findings: There is no evidence of acute intracranial hemorrhage, brain edema, mass lesion, acute infarction,   mass effect, or midline shift. Acute infarct may be inapparent on noncontrast CT. No other intra-axial abnormalities are seen, and the ventricles and sulci are within normal limits in size and symmetry.   No abnormal extra-axial fluid collections or masses are identified.  No significant calvarial abnormality.  IMPRESSION: 1. Negative for bleed or other acute intracranial process.   Original Report Authenticated By: D. Andria Rhein, MD       1. Headache       MDM  27 year old female with headache. I'm going to discharge the patient with Phenergan, and will order a head CT. This patient has been discussed with Dr. Silverio Lay, who agrees with the plan.  8:14 PM Patient feeling much better after Phenergan, no acute process seen on head CT. Will discharge the patient to home with ENT followup. Patient understands and agrees with the plan. She is stable and ready for discharge.        Roxy Horseman, PA-C 11/12/12 2015

## 2012-11-12 NOTE — ED Notes (Signed)
Pt presenting to ed with c/o headache x 1 year pt states pain is getting worse and now she's having nausea. Pt states she can't get in to see the doctor that's on her medicaid card. Pt states pain is radiating into her neck.

## 2012-11-12 NOTE — ED Provider Notes (Signed)
Medical screening examination/treatment/procedure(s) were performed by non-physician practitioner and as supervising physician I was immediately available for consultation/collaboration.   Richardean Canal, MD 11/12/12 [phone]

## 2013-02-15 ENCOUNTER — Encounter (HOSPITAL_COMMUNITY): Payer: Self-pay

## 2013-02-15 ENCOUNTER — Emergency Department (HOSPITAL_COMMUNITY)
Admission: EM | Admit: 2013-02-15 | Discharge: 2013-02-15 | Disposition: A | Discharge status: Home / Self Care | Payer: Medicaid Other | Source: Home / Self Care | Attending: Emergency Medicine | Admitting: Emergency Medicine

## 2013-02-15 DIAGNOSIS — R05 Cough: Secondary | ICD-10-CM

## 2013-02-15 DIAGNOSIS — J029 Acute pharyngitis, unspecified: Secondary | ICD-10-CM

## 2013-02-15 MED ORDER — FEXOFENADINE-PSEUDOEPHED ER 60-120 MG PO TB12: 1.0000 | ORAL_TABLET | Freq: Two times a day (BID) | ORAL | Status: DC

## 2013-02-15 MED ORDER — FLUTICASONE PROPIONATE 50 MCG/ACT NA SUSP: 2.0000 | Freq: Every day | NASAL | Status: DC

## 2013-02-15 NOTE — ED Notes (Signed)
C/o cough, congestion not relieved by OTC medication; c/o yellow secretions, ear fullness and pressure; NAD, handing secretions well

## 2013-02-15 NOTE — ED Provider Notes (Signed)
History     CSN: [id number]  Arrival date & time 02/15/13  1416   First MD Initiated Contact with Patient 02/15/13 1420      Chief Complaint  Patient presents with  . Cough    (Consider location/radiation/quality/duration/timing/severity/associated sxs/prior treatment) HPI Comments: Patient presents to urgent care the describes she's been coughing as she has been feeling a tingling sensation discomfort in her throat. She also has a " burning throat with a tingling sensation", also describes year discomfort and pressure. She has tried one or 2 tablets of Zyrtec and Allegra with no improvement. No fevers, shortness of breath wheezing.  Patient is a 27 y.o. female presenting with cough. The history is provided by the patient.  Cough Cough characteristics:  Dry Severity:  Mild Onset quality:  Sudden Timing:  Constant Progression:  Waxing and waning Context: not occupational exposure, not upper respiratory infection and not weather changes   Relieved by:  Nothing Worsened by:  Nothing tried Associated symptoms: ear pain, rhinorrhea and sore throat   Associated symptoms: no chest pain, no chills, no diaphoresis, no eye discharge, no fever, no headaches, no myalgias, no rash, no shortness of breath, no sinus congestion, no weight loss and no wheezing   Risk factors: no recent travel     Past Medical History  Diagnosis Date  . Complication of anesthesia   . No pertinent past medical history     Past Surgical History  Procedure Laterality Date  . Cesarean section      Family History  Problem Relation Age of Onset  . Alcohol abuse Neg Hx   . Arthritis Neg Hx   . Asthma Neg Hx   . Birth defects Neg Hx   . Cancer Neg Hx   . COPD Neg Hx   . Depression Neg Hx   . Diabetes Neg Hx   . Drug abuse Neg Hx   . Early death Neg Hx   . Hearing loss Neg Hx   . Heart disease Neg Hx   . Hyperlipidemia Neg Hx   . Hypertension Neg Hx   . Kidney disease Neg Hx   . Learning disabilities  Neg Hx   . Mental illness Neg Hx   . Mental retardation Neg Hx   . Miscarriages / Stillbirths Neg Hx   . Stroke Neg Hx   . Vision loss Neg Hx     History  Substance Use Topics  . Smoking status: Never Smoker   . Smokeless tobacco: Never Used  . Alcohol Use: No    OB History   Grav Para Term Preterm Abortions TAB SAB Ect Mult Living   1 1 1       1       Review of Systems  Constitutional: Negative for fever, chills, weight loss and diaphoresis.  HENT: Positive for ear pain, sore throat and rhinorrhea.   Eyes: Negative for discharge.  Respiratory: Positive for cough. Negative for shortness of breath, wheezing and stridor.   Cardiovascular: Negative for chest pain.  Musculoskeletal: Negative for myalgias.  Skin: Negative for color change and rash.  Neurological: Negative for headaches.    Allergies  Review of patient's allergies indicates no known allergies.  Home Medications   Current Outpatient Rx  Name  Route  Sig  Dispense  Refill  . fexofenadine-pseudoephedrine (ALLEGRA-D) 60-120 MG per tablet   Oral   Take 1 tablet by mouth every 12 (twelve) hours.   30 tablet   0   . fluticasone (  FLONASE) 50 MCG/ACT nasal spray   Nasal   Place 2 sprays into the nose daily.   16 g   2     BP 112/71  Pulse 79  Temp(Src) 98.6 F (37 C) (Oral)  Resp 20  SpO2 100%  Physical Exam  Nursing note and vitals reviewed. Constitutional: Vital signs are normal. She appears well-developed and well-nourished.  Non-toxic appearance. She does not have a sickly appearance. She does not appear ill.  HENT:  Head: Normocephalic.  Right Ear: Tympanic membrane normal.  Left Ear: Tympanic membrane normal.  Nose: Rhinorrhea present.  Mouth/Throat: Uvula is midline. Posterior oropharyngeal erythema present. No oropharyngeal exudate, posterior oropharyngeal edema or tonsillar abscesses.  Eyes: Conjunctivae and EOM are normal. Pupils are equal, round, and reactive to light. Right eye  exhibits no discharge. Left eye exhibits no discharge. No scleral icterus.  Neck: Neck supple. No JVD present.  Pulmonary/Chest: Effort normal and breath sounds normal.  Lymphadenopathy:    She has no cervical adenopathy.  Neurological: She is alert.  Skin: No rash noted. No erythema.    ED Course  Procedures (including critical care time)  Labs Reviewed - No data to display No results found.   1. Pharyngitis   2. Cough       MDM  Pharyngitis and a reactive cough. Most likely secondary to environmental allergies. Patient has been instructed formally to take Allegra-D for [redacted] weeks along with Flonase nasal spray.        Jimmie Molly, MD 02/15/13 [phone]

## 2013-02-16 NOTE — ED Notes (Signed)
Patient called, asking if there could be any changes in her therapy, as she is not resting. Spoke w Dr Ladon Applebaum. Who wants pt to continue her Rx as written, and to give it a good 72 h to allow for her therapy to start to work. Pt verbalized understanding of plan

## 2013-05-22 ENCOUNTER — Inpatient Hospital Stay (HOSPITAL_COMMUNITY)
Admission: AD | Admit: 2013-05-22 | Discharge: 2013-05-22 | Disposition: A | Discharge status: Home / Self Care | Payer: Medicaid Other | Source: Ambulatory Visit | Attending: Obstetrics & Gynecology | Admitting: Obstetrics & Gynecology

## 2013-05-22 ENCOUNTER — Encounter (HOSPITAL_COMMUNITY): Payer: Self-pay | Admitting: Obstetrics and Gynecology

## 2013-05-22 DIAGNOSIS — L293 Anogenital pruritus, unspecified: Secondary | ICD-10-CM | POA: Insufficient documentation

## 2013-05-22 DIAGNOSIS — B373 Candidiasis of vulva and vagina: Secondary | ICD-10-CM

## 2013-05-22 DIAGNOSIS — B3731 Acute candidiasis of vulva and vagina: Secondary | ICD-10-CM | POA: Insufficient documentation

## 2013-05-22 DIAGNOSIS — R109 Unspecified abdominal pain: Secondary | ICD-10-CM | POA: Insufficient documentation

## 2013-05-22 HISTORY — DX: Anemia, unspecified: D64.9

## 2013-05-22 LAB — WET PREP, GENITAL
Trich, Wet Prep: NONE SEEN
Yeast Wet Prep HPF POC: NONE SEEN

## 2013-05-22 LAB — URINALYSIS, ROUTINE W REFLEX MICROSCOPIC
Bilirubin Urine: NEGATIVE
Glucose, UA: NEGATIVE mg/dL
Protein, ur: NEGATIVE mg/dL

## 2013-05-22 LAB — URINE MICROSCOPIC-ADD ON

## 2013-05-22 MED ORDER — FLUCONAZOLE 150 MG PO TABS: 150.0000 mg | ORAL_TABLET | Freq: Once | ORAL | Status: DC

## 2013-05-22 MED ORDER — FLUCONAZOLE 150 MG PO TABS
150.0000 mg | ORAL_TABLET | Freq: Once | ORAL | Status: AC
  Administered 2013-05-22: 150 mg via ORAL
  Filled 2013-05-22: qty 1

## 2013-05-22 NOTE — MAU Note (Signed)
Kellie Davila is here today with various symptoms. She is having lower abdominal pain, urinary frequency, vaginal itching and white vaginal discharge. She was last seen at [facility] 1 year ago. LMP April 28, 2013

## 2013-05-22 NOTE — MAU Provider Note (Signed)
History     CSN: [id number]  Arrival date and time: 05/22/13 1038   First Provider Initiated Contact with Patient 05/22/13 1118      Chief Complaint  Patient presents with  . Abdominal Pain  . Vaginal Itching   HPI [name] is a 27 y.o. G1P1001 who presents to MAU today with complain of thick, white vaginal discharge x 1 week. The patient has also noticed some spotting in the discharge occasionally. She is having itching and irritation of the vulvar area as well. She also states increased urinary frequency without urgency or dysuria. She has occasional mild bilateral lower abdominal cramping. She denies N/V/D or fever. Patient states a history of frequent yeast infections. Patient also states recent [facility].   OB History   Grav Para Term Preterm Abortions TAB SAB Ect Mult Living   1 1 1       1       Past Medical History  Diagnosis Date  . No pertinent past medical history   . Abnormal Pap smear   . Anemia     Past Surgical History  Procedure Laterality Date  . Cesarean section      Family History  Problem Relation Age of Onset  . Alcohol abuse Neg Hx   . Arthritis Neg Hx   . Asthma Neg Hx   . Birth defects Neg Hx   . Cancer Neg Hx   . COPD Neg Hx   . Depression Neg Hx   . Diabetes Neg Hx   . Drug abuse Neg Hx   . Early death Neg Hx   . Hearing loss Neg Hx   . Heart disease Neg Hx   . Hyperlipidemia Neg Hx   . Hypertension Neg Hx   . Kidney disease Neg Hx   . Learning disabilities Neg Hx   . Mental illness Neg Hx   . Mental retardation Neg Hx   . Miscarriages / Stillbirths Neg Hx   . Stroke Neg Hx   . Vision loss Neg Hx     History  Substance Use Topics  . Smoking status: Never Smoker   . Smokeless tobacco: Never Used  . Alcohol Use: No    Allergies: No Known Allergies  Prescriptions prior to admission  Medication Sig Dispense Refill  . escitalopram (LEXAPRO) 10 MG tablet Take 5 mg by mouth  daily.      . promethazine (PHENERGAN) 25 MG tablet Take 25 mg by mouth daily as needed for nausea.        Review of Systems  Constitutional: Negative for fever and malaise/fatigue.  Gastrointestinal: Positive for abdominal pain. Negative for nausea, vomiting, diarrhea and constipation.  Genitourinary: Positive for frequency. Negative for dysuria and urgency.       + vaginal discharge, spotting   Physical Exam   Blood pressure 96/67, pulse 73, temperature 98.3 F (36.8 C), temperature source Oral, resp. rate 18, height 5\' 2"  (1.575 m), weight 127 lb (57.607 kg), last menstrual period 04/28/2013.  Physical Exam  Constitutional: She is oriented to person, place, and time. She appears well-developed and well-nourished. No distress.  HENT:  Head: Normocephalic and atraumatic.  Cardiovascular: Normal rate, regular rhythm and normal heart sounds.   Respiratory: Effort normal and breath sounds normal. No respiratory distress.  GI: Soft. Bowel sounds are normal. She exhibits no distension and no mass. There is tenderness (mild lower abdominal tenderness to palpation). There is no rebound and no  guarding.  Genitourinary: Uterus is not enlarged and not tender. Cervix exhibits no motion tenderness, no discharge and no friability. Right adnexum displays no mass and no tenderness. Left adnexum displays no mass and no tenderness. No bleeding around the vagina. Vaginal discharge (moderate amount of thick, white vaginal discharge noted) found.  Neurological: She is alert and oriented to person, place, and time.  Skin: Skin is warm and dry. No erythema.  Psychiatric: She has a normal mood and affect.   Results for orders placed during the hospital encounter of 05/22/13 (from the past 24 hour(s))  URINALYSIS, ROUTINE W REFLEX MICROSCOPIC     Status: Abnormal   Collection Time    05/22/13 11:00 AM      Result Value Range   Color, Urine YELLOW  YELLOW   APPearance CLEAR  CLEAR   Specific Gravity, Urine  1.020  1.005 - 1.030   pH 6.0  5.0 - 8.0   Glucose, UA NEGATIVE  NEGATIVE mg/dL   Hgb urine dipstick TRACE (*) NEGATIVE   Bilirubin Urine NEGATIVE  NEGATIVE   Ketones, ur NEGATIVE  NEGATIVE mg/dL   Protein, ur NEGATIVE  NEGATIVE mg/dL   Urobilinogen, UA 0.2  0.0 - 1.0 mg/dL   Nitrite NEGATIVE  NEGATIVE   Leukocytes, UA NEGATIVE  NEGATIVE  URINE MICROSCOPIC-ADD ON     Status: Abnormal   Collection Time    05/22/13 11:00 AM      Result Value Range   Squamous Epithelial / LPF FEW (*) RARE   WBC, UA 0-2  <3 WBC/hpf   RBC / HPF 0-2  <3 RBC/hpf   Bacteria, UA FEW (*) RARE   Urine-Other MUCOUS PRESENT    POCT PREGNANCY, URINE     Status: None   Collection Time    05/22/13 11:09 AM      Result Value Range   Preg Test, Ur NEGATIVE  NEGATIVE    MAU Course  Procedures None  MDM UPT - negative UA, Wet prep, GC/Chlamydia 150 mg Diflucan given in MAU Patient advised to call [facility] for referral for abnormal pap smear. Patient has previously been seen by [facility] and would like to follow-up there. She will get ROI for pap results and follow-up as indicated Assessment and Plan  A: Yeast vaginitis, clinical  P: Discharge home Rx for Diflucan sent to patient's pharmacy to be taken in 3 days Patient encouraged to follow-up with PCP or provider of choice if symptoms persist Patient may return to MAU as needed or if her condition were to change or worsen  Freddi Starr, PA-C  05/22/2013, 11:45 AM

## 2013-05-23 LAB — GC/CHLAMYDIA PROBE AMP
CT Probe RNA: NEGATIVE
GC Probe RNA: NEGATIVE

## 2013-08-07 ENCOUNTER — Emergency Department (HOSPITAL_COMMUNITY)
Admission: EM | Admit: 2013-08-07 | Discharge: 2013-08-07 | Disposition: A | Discharge status: Home / Self Care | Payer: Medicaid Other | Source: Home / Self Care

## 2013-08-07 ENCOUNTER — Encounter (HOSPITAL_COMMUNITY): Payer: Self-pay | Admitting: Emergency Medicine

## 2013-08-07 DIAGNOSIS — R110 Nausea: Secondary | ICD-10-CM

## 2013-08-07 DIAGNOSIS — B9789 Other viral agents as the cause of diseases classified elsewhere: Secondary | ICD-10-CM

## 2013-08-07 DIAGNOSIS — S43499A Other sprain of unspecified shoulder joint, initial encounter: Secondary | ICD-10-CM

## 2013-08-07 DIAGNOSIS — B349 Viral infection, unspecified: Secondary | ICD-10-CM

## 2013-08-07 DIAGNOSIS — R0982 Postnasal drip: Secondary | ICD-10-CM

## 2013-08-07 DIAGNOSIS — S46819A Strain of other muscles, fascia and tendons at shoulder and upper arm level, unspecified arm, initial encounter: Secondary | ICD-10-CM

## 2013-08-07 MED ORDER — PROMETHAZINE HCL 25 MG PO TABS: 25.0000 mg | ORAL_TABLET | Freq: Four times a day (QID) | ORAL | Status: DC | PRN

## 2013-08-07 NOTE — ED Provider Notes (Signed)
Medical screening examination/treatment/procedure(s) were performed by non-physician practitioner and as supervising physician I was immediately available for consultation/collaboration.  Leslee Home, M.D.  Reuben Likes, MD 08/07/13 [phone]

## 2013-08-07 NOTE — ED Provider Notes (Signed)
CSN: [id number]     Arrival date & time 08/07/13  1812 History   First MD Initiated Contact with Patient 08/07/13 1927     Chief Complaint  Patient presents with  . 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 Is also complaining that diminished appetite, bodyaches and nausea. She has been taking Zofran 8 mg by mouth without relief of nausea. She states the Phenergan has worked for her in the past. She has a scratchy throat but no sore throat. Denies nasal congestion. She does endorse PND.   Past Medical History  Diagnosis Date  . No pertinent past medical history   . Abnormal Pap smear   . Anemia    Past Surgical History  Procedure Laterality Date  . Cesarean section     Family History  Problem Relation Age of Onset  . Alcohol abuse Neg Hx   . Arthritis Neg Hx   . Asthma Neg Hx   . Birth defects Neg Hx   . Cancer Neg Hx   . COPD Neg Hx   . Depression Neg Hx   . Diabetes Neg Hx   . Drug abuse Neg Hx   . Early death Neg Hx   . Hearing loss Neg Hx   . Heart disease Neg Hx   . Hyperlipidemia Neg Hx   . Hypertension Neg Hx   . Kidney disease Neg Hx   . Learning disabilities Neg Hx   . Mental illness Neg Hx   . Mental retardation Neg Hx   . Miscarriages / Stillbirths Neg Hx   . Stroke Neg Hx   . Vision loss Neg Hx    History  Substance Use Topics  . Smoking status: Never Smoker   . Smokeless tobacco: Never Used  . Alcohol Use: No   OB History   Grav Para Term Preterm Abortions TAB SAB Ect Mult Living   1 1 1       1      Review of Systems  Constitutional: Positive for appetite change. Negative for fever, activity change and fatigue.   HENT: Positive for postnasal drip. Negative for ear discharge, hearing loss, sore throat and trouble swallowing.   Respiratory: Negative for cough, wheezing and stridor.   Cardiovascular: Negative for chest pain and palpitations.  Gastrointestinal: Positive for nausea and constipation. Negative for vomiting.  Neurological: Negative.     Allergies  Review of patient's allergies indicates no known allergies.  Home Medications   Current Outpatient Rx  Name  Route  Sig  Dispense  Refill  . ibuprofen (ADVIL,MOTRIN) 400 MG tablet   Oral   Take 400 mg by mouth every 6 (six) hours as needed for pain.         Marland Kitchen escitalopram (LEXAPRO) 10 MG tablet   Oral   Take 5 mg by mouth daily.         . fluconazole (DIFLUCAN) 150 MG tablet   Oral   Take 1 tablet (150 mg total) by mouth once.   1 tablet   0   . promethazine (PHENERGAN) 25 MG tablet   Oral   Take 25 mg by mouth daily as needed for nausea.         . promethazine (PHENERGAN) 25  MG tablet   Oral   Take 1 tablet (25 mg total) by mouth every 6 (six) hours as needed for nausea.   15 tablet   0    BP 93/53  Pulse 72  Temp(Src) 98.6 F (37 C) (Oral)  Resp 14  SpO2 100% Physical Exam  Nursing note and vitals reviewed. Constitutional: She is oriented to person, place, and time. She appears well-nourished. No distress.  HENT:  Right Ear: External ear normal.  Left Ear: External ear normal.  Oropharynx without erythema positive for PND with minor cobblestoning. No exudates.  Eyes: Conjunctivae and EOM are normal.  Neck: Normal range of motion. Neck supple.  Cardiovascular: Normal rate, regular rhythm and normal heart sounds.   Pulmonary/Chest: Effort normal and breath sounds normal. No respiratory distress. She has no wheezes.  Abdominal: Soft. There is no tenderness. There is no rebound and no guarding.  Musculoskeletal: She exhibits no edema.  Neurological: She is alert and oriented to person, place, and time.  Skin:  Skin is warm and dry.    ED Course  Procedures (including critical care time) Labs Review Labs Reviewed - No data to display Imaging Review No results found.    MDM   1. PND (post-nasal drip)   2. Nausea   3. Viral syndrome   4. 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 Heat to your neck and stretches. She is discharged in stable condition.   Hayden Rasmussen, NP 08/07/13 2007

## 2013-08-07 NOTE — ED Notes (Signed)
Headache, nausea, body aches, neck pain, onset one week ago

## 2013-08-17 NOTE — ED Notes (Signed)
Pt called about replacement rx for phenergan . As this is day #10 of her problems, she needs to be rechecked or see her regular MD

## 2013-09-01 ENCOUNTER — Other Ambulatory Visit: Payer: Self-pay | Admitting: Family

## 2013-09-01 ENCOUNTER — Encounter (HOSPITAL_COMMUNITY): Payer: Self-pay | Admitting: *Deleted

## 2013-09-01 ENCOUNTER — Inpatient Hospital Stay (HOSPITAL_COMMUNITY)
Admission: AD | Admit: 2013-09-01 | Discharge: 2013-09-01 | Disposition: A | Discharge status: Home / Self Care | Payer: Medicaid Other | Source: Ambulatory Visit | Attending: Obstetrics & Gynecology | Admitting: Obstetrics & Gynecology

## 2013-09-01 DIAGNOSIS — N760 Acute vaginitis: Secondary | ICD-10-CM

## 2013-09-01 DIAGNOSIS — B9689 Other specified bacterial agents as the cause of diseases classified elsewhere: Secondary | ICD-10-CM | POA: Insufficient documentation

## 2013-09-01 DIAGNOSIS — R109 Unspecified abdominal pain: Secondary | ICD-10-CM | POA: Insufficient documentation

## 2013-09-01 DIAGNOSIS — N949 Unspecified condition associated with female genital organs and menstrual cycle: Secondary | ICD-10-CM | POA: Insufficient documentation

## 2013-09-01 DIAGNOSIS — A499 Bacterial infection, unspecified: Secondary | ICD-10-CM | POA: Insufficient documentation

## 2013-09-01 DIAGNOSIS — L293 Anogenital pruritus, unspecified: Secondary | ICD-10-CM | POA: Insufficient documentation

## 2013-09-01 LAB — URINALYSIS, ROUTINE W REFLEX MICROSCOPIC
Leukocytes, UA: NEGATIVE
Nitrite: NEGATIVE
Specific Gravity, Urine: 1.02 (ref 1.005–1.030)
pH: 6 (ref 5.0–8.0)

## 2013-09-01 MED ORDER — METRONIDAZOLE 0.75 % VA GEL: 1.0000 | Freq: Every day | VAGINAL | Status: DC

## 2013-09-01 MED ORDER — METRONIDAZOLE 500 MG PO TABS: 500.0000 mg | ORAL_TABLET | Freq: Two times a day (BID) | ORAL | Status: DC

## 2013-09-01 NOTE — MAU Note (Signed)
Lower abd cramping, vaginal itching, odor, has cheesy watery discharge.

## 2013-09-01 NOTE — MAU Provider Note (Signed)
History     CSN: [id number]  Arrival date and time: 09/01/13 1209   First Provider Initiated Contact with Patient 09/01/13 1307      Chief Complaint  Patient presents with  . Abdominal Pain  . Vaginal Discharge   HPI  Pt is a 27 yo G1P1001 here with report of white vaginal discharge and itching x one week.  Also reports intermittent lower pelvic pain that started a month ago.  Patient's last menstrual period was 08/16/2013.  No current form of birth control.    Past Medical History  Diagnosis Date  . No pertinent past medical history   . Abnormal Pap smear   . Anemia     Past Surgical History  Procedure Laterality Date  . Cesarean section      Family History  Problem Relation Age of Onset  . Alcohol abuse Neg Hx   . Arthritis Neg Hx   . Asthma Neg Hx   . Birth defects Neg Hx   . Cancer Neg Hx   . COPD Neg Hx   . Depression Neg Hx   . Diabetes Neg Hx   . Drug abuse Neg Hx   . Early death Neg Hx   . Hearing loss Neg Hx   . Heart disease Neg Hx   . Hyperlipidemia Neg Hx   . Hypertension Neg Hx   . Kidney disease Neg Hx   . Learning disabilities Neg Hx   . Mental illness Neg Hx   . Mental retardation Neg Hx   . Miscarriages / Stillbirths Neg Hx   . Stroke Neg Hx   . Vision loss Neg Hx     History  Substance Use Topics  . Smoking status: Never Smoker   . Smokeless tobacco: Never Used  . Alcohol Use: No    Allergies: No Known Allergies  No prescriptions prior to admission    Review of Systems  Gastrointestinal: Positive for abdominal pain (lower pelvic).  Genitourinary:       Vaginal itching and discharge  All other systems reviewed and are negative.   Physical Exam   Blood pressure 97/59, pulse 68, temperature 98.8 F (37.1 C), temperature source Oral, resp. rate 16, height 5\' 2"  (1.575 m), weight 59.33 kg (130 lb 12.8 oz), last menstrual period 08/16/2013.  Physical Exam  Constitutional: She is oriented to person, place, and time. She  appears well-developed and well-nourished. No distress.  HENT:  Head: Normocephalic.  Neck: Normal range of motion. Neck supple.  Cardiovascular: Normal rate, regular rhythm and normal heart sounds.   Respiratory: Effort normal and breath sounds normal. No respiratory distress.  GI: Soft. She exhibits no mass. There is no tenderness. There is no rebound, no guarding and no CVA tenderness.  Genitourinary: Uterus is not enlarged and not tender. Cervix exhibits no motion tenderness and no discharge. Right adnexum displays no mass and no tenderness. Left adnexum displays no mass and no tenderness. Vaginal discharge (white, creamy) found.  Musculoskeletal: Normal range of motion.  Neurological: She is alert and oriented to person, place, and time.  Skin: Skin is warm and dry.  Psychiatric: She has a normal mood and affect.    MAU Course  Procedures Results for orders placed during the hospital encounter of 09/01/13 (from the past 24 hour(s))  URINALYSIS, ROUTINE W REFLEX MICROSCOPIC     Status: None   Collection Time    09/01/13 12:20 PM      Result Value Range   Color, Urine  YELLOW  YELLOW   APPearance CLEAR  CLEAR   Specific Gravity, Urine 1.020  1.005 - 1.030   pH 6.0  5.0 - 8.0   Glucose, UA NEGATIVE  NEGATIVE mg/dL   Hgb urine dipstick NEGATIVE  NEGATIVE   Bilirubin Urine NEGATIVE  NEGATIVE   Ketones, ur NEGATIVE  NEGATIVE mg/dL   Protein, ur NEGATIVE  NEGATIVE mg/dL   Urobilinogen, UA 0.2  0.0 - 1.0 mg/dL   Nitrite NEGATIVE  NEGATIVE   Leukocytes, UA NEGATIVE  NEGATIVE  POCT PREGNANCY, URINE     Status: None   Collection Time    09/01/13 12:49 PM      Result Value Range   Preg Test, Ur NEGATIVE  NEGATIVE  WET PREP, GENITAL     Status: Abnormal   Collection Time    09/01/13  1:32 PM      Result Value Range   Yeast Wet Prep HPF POC NONE SEEN  NONE SEEN   Trich, Wet Prep NONE SEEN  NONE SEEN   Clue Cells Wet Prep HPF POC MODERATE (*) NONE SEEN   WBC, Wet Prep HPF POC FEW  (*) NONE SEEN     Assessment and Plan  Bacterial Vaginosis  Plan: Discharge to home RX Flagyl 500 mg BID X 7 days. GC/CT pending  [facility] 09/01/2013, 1:08 PM

## 2013-09-01 NOTE — MAU Provider Note (Signed)
Attestation of Attending Supervision of Advanced Practitioner (CNM/NP): Evaluation and management procedures were performed by the Advanced Practitioner under my supervision and collaboration.  I have reviewed the Advanced Practitioner's note and chart, and I agree with the management and plan.  HARRAWAY-SMITH, Antonyo Hinderer 5:41 PM     

## 2013-09-02 LAB — GC/CHLAMYDIA PROBE AMP
CT Probe RNA: NEGATIVE
GC Probe RNA: NEGATIVE

## 2013-09-15 ENCOUNTER — Inpatient Hospital Stay (HOSPITAL_COMMUNITY)
Admission: AD | Admit: 2013-09-15 | Discharge: 2013-09-15 | Disposition: A | Discharge status: Home / Self Care | Payer: Medicaid Other | Source: Ambulatory Visit | Attending: Obstetrics & Gynecology | Admitting: Obstetrics & Gynecology

## 2013-09-15 ENCOUNTER — Encounter (HOSPITAL_COMMUNITY): Payer: Self-pay

## 2013-09-15 DIAGNOSIS — L293 Anogenital pruritus, unspecified: Secondary | ICD-10-CM | POA: Insufficient documentation

## 2013-09-15 DIAGNOSIS — B373 Candidiasis of vulva and vagina: Secondary | ICD-10-CM | POA: Insufficient documentation

## 2013-09-15 DIAGNOSIS — B3731 Acute candidiasis of vulva and vagina: Secondary | ICD-10-CM | POA: Insufficient documentation

## 2013-09-15 LAB — URINALYSIS, ROUTINE W REFLEX MICROSCOPIC
Hgb urine dipstick: NEGATIVE
Nitrite: NEGATIVE
Specific Gravity, Urine: 1.02 (ref 1.005–1.030)
Urobilinogen, UA: 0.2 mg/dL (ref 0.0–1.0)
pH: 6 (ref 5.0–8.0)

## 2013-09-15 LAB — WET PREP, GENITAL
Clue Cells Wet Prep HPF POC: NONE SEEN
Trich, Wet Prep: NONE SEEN
Yeast Wet Prep HPF POC: NONE SEEN

## 2013-09-15 MED ORDER — TRIAMCINOLONE ACETONIDE 0.1 % EX CREA: 1.0000 "application " | TOPICAL_CREAM | Freq: Two times a day (BID) | CUTANEOUS | Status: DC

## 2013-09-15 MED ORDER — NYSTATIN 100000 UNIT/GM EX CREA: TOPICAL_CREAM | CUTANEOUS | Status: DC

## 2013-09-15 MED ORDER — FLUCONAZOLE 150 MG PO TABS: 150.0000 mg | ORAL_TABLET | Freq: Every day | ORAL | Status: DC

## 2013-09-15 NOTE — MAU Note (Signed)
Patient is in with c/o persistent yeast infections and a new onset of frequency urinating. She states that she was treated for BV 3 weeks ago and did not get relief. She denies any other complains. Patient states that she is in the process of changing her gyn doctor.

## 2013-09-15 NOTE — MAU Provider Note (Signed)
History     CSN: [id number]  Arrival date and time: 09/15/13 1146   First Provider Initiated Contact with Patient 09/15/13 1228      Chief Complaint  Patient presents with  . Vaginal Discharge  . Nocturia   HPI Comments: Kellie Davila 27 y.o. G1P1001 presents with complaints of vaginal itching.  She was recently treated for BV, completed the course of antibiotic therapy, and noted improvement in the discharge.  She has, however, developed severe vaginal itching.  She endorses frequent yeast infections and UTI.  Her last yeast infection was 2 months ago and was treated with diflucan.  She states that she usually requires treatment every 2-3 months.  She is self-aware of her risk factors for yeast infections.  She denies dietary changes, changes in detergents, changes in soaps and lotions.  She has not been under any added stress.  She denies currently having discharge or odor.  She last had sexual intercourse 4 days ago.  She reports one female sexual partner. Her GYN care has been provided by her PCP and she has been unhappy with her experiences.  She is planning on changing back to [facility] once her insurance card is changed.    Vaginal Discharge The patient's primary symptoms include a vaginal discharge. Associated symptoms include frequency. Pertinent negatives include no dysuria, flank pain, hematuria or urgency.    Past Medical History  Diagnosis Date  . No pertinent past medical history   . Abnormal Pap smear   . Anemia     Past Surgical History  Procedure Laterality Date  . Cesarean section      Family History  Problem Relation Age of Onset  . Alcohol abuse Neg Hx   . Arthritis Neg Hx   . Asthma Neg Hx   . Birth defects Neg Hx   . Cancer Neg Hx   . COPD Neg Hx   . Depression Neg Hx   . Diabetes Neg Hx   . Drug abuse Neg Hx   . Early death Neg Hx   . Hearing loss Neg Hx   . Heart disease Neg Hx   . Hyperlipidemia Neg Hx   . Hypertension Neg Hx   . Kidney disease  Neg Hx   . Learning disabilities Neg Hx   . Mental illness Neg Hx   . Mental retardation Neg Hx   . Miscarriages / Stillbirths Neg Hx   . Stroke Neg Hx   . Vision loss Neg Hx     History  Substance Use Topics  . Smoking status: Never Smoker   . Smokeless tobacco: Never Used  . Alcohol Use: No    Allergies: No Known Allergies  Prescriptions prior to admission  Medication Sig Dispense Refill  . ibuprofen (ADVIL,MOTRIN) 200 MG tablet Take 400 mg by mouth once.        Review of Systems  Constitutional: Negative.   HENT: Negative.   Eyes: Negative.   Respiratory: Negative.   Cardiovascular: Negative.   Gastrointestinal: Negative.   Genitourinary: Positive for frequency and vaginal discharge. Negative for dysuria, urgency, hematuria and flank pain.       Vaginal itching  Musculoskeletal: Negative.   Skin: Negative.   Neurological: Negative.   Endo/Heme/Allergies: Negative.   Psychiatric/Behavioral: Negative.    Physical Exam   Blood pressure 108/56, pulse 95, temperature 98.2 F (36.8 C), temperature source Oral, resp. rate 16, last menstrual period 08/29/2013, SpO2 100.00%.  Physical Exam  Constitutional: She appears well-developed and well-nourished.  No distress.  HENT:  Head: Normocephalic and atraumatic.  Eyes: Pupils are equal, round, and reactive to light.  Neck: Normal range of motion.  Cardiovascular: Normal rate, regular rhythm and normal heart sounds.   Respiratory: Effort normal and breath sounds normal.  GI: Soft. Bowel sounds are normal.  Genitourinary: Uterus is not enlarged and not tender. Cervix exhibits no motion tenderness, no discharge and no friability. Right adnexum displays no mass and no tenderness. Left adnexum displays no mass and no tenderness. No bleeding around the vagina. Vaginal discharge (moderate amount of white discharge noted) found.   Results for orders placed during the hospital encounter of 09/15/13 (from the past 24 hour(s))   URINALYSIS, ROUTINE W REFLEX MICROSCOPIC     Status: None   Collection Time    09/15/13 12:00 PM      Result Value Range   Color, Urine YELLOW  YELLOW   APPearance CLEAR  CLEAR   Specific Gravity, Urine 1.020  1.005 - 1.030   pH 6.0  5.0 - 8.0   Glucose, UA NEGATIVE  NEGATIVE mg/dL   Hgb urine dipstick NEGATIVE  NEGATIVE   Bilirubin Urine NEGATIVE  NEGATIVE   Ketones, ur NEGATIVE  NEGATIVE mg/dL   Protein, ur NEGATIVE  NEGATIVE mg/dL   Urobilinogen, UA 0.2  0.0 - 1.0 mg/dL   Nitrite NEGATIVE  NEGATIVE   Leukocytes, UA NEGATIVE  NEGATIVE  WET PREP, GENITAL     Status: Abnormal   Collection Time    09/15/13 12:45 PM      Result Value Range   Yeast Wet Prep HPF POC NONE SEEN  NONE SEEN   Trich, Wet Prep NONE SEEN  NONE SEEN   Clue Cells Wet Prep HPF POC NONE SEEN  NONE SEEN   WBC, Wet Prep HPF POC FEW (*) NONE SEEN    MAU Course  Procedures None  MDM UA and wet prep today  Assessment and Plan    [place], Meilissa E 09/15/2013, 12:32 PM   A: Yeast vulvovaginitis, clinical  P: Discharge home Rx for Diflucan, Nystatin and Triamcinolone sent to the patient's pharmacy Patient advised that the creams are for external use only Patient advised to follow-up with PCP if symptoms persist or worsen Patient may return to MAU as needed or if her condition were to change or worsen  I have seen and evaluated the patient with the NP student. I agree with the assessment and plan as written above.   Freddi Starr, PA-C 09/15/2013 4:48 PM

## 2013-09-17 ENCOUNTER — Emergency Department (HOSPITAL_COMMUNITY)
Admission: EM | Admit: 2013-09-17 | Discharge: 2013-09-17 | Disposition: A | Discharge status: Home / Self Care | Payer: Medicaid Other | Source: Home / Self Care | Attending: Family Medicine | Admitting: Family Medicine

## 2013-09-17 DIAGNOSIS — J069 Acute upper respiratory infection, unspecified: Secondary | ICD-10-CM

## 2013-09-17 MED ORDER — IPRATROPIUM BROMIDE 0.06 % NA SOLN: 2.0000 | Freq: Four times a day (QID) | NASAL | Status: DC

## 2013-09-17 MED ORDER — FEXOFENADINE HCL 180 MG PO TABS: 180.0000 mg | ORAL_TABLET | Freq: Every day | ORAL | Status: DC

## 2013-09-17 NOTE — ED Provider Notes (Signed)
CSN: [id number]     Arrival date & time 09/17/13  1320 History   First MD Initiated Contact with Patient 09/17/13 1516     No chief complaint on file.  (Consider location/radiation/quality/duration/timing/severity/associated sxs/prior Treatment) Patient is a 27 y.o. female presenting with URI. The history is provided by the patient.  URI Presenting symptoms: congestion, ear pain, facial pain, rhinorrhea and sore throat   Presenting symptoms: no fever   Severity:  Mild Onset quality:  Gradual Duration:  1 day Chronicity:  New Ineffective treatments:  Prescription medications Risk factors: sick contacts   Risk factors: no recent illness     Past Medical History  Diagnosis Date  . No pertinent past medical history   . Abnormal Pap smear   . Anemia    Past Surgical History  Procedure Laterality Date  . Cesarean section     Family History  Problem Relation Age of Onset  . Alcohol abuse Neg Hx   . Arthritis Neg Hx   . Asthma Neg Hx   . Birth defects Neg Hx   . Cancer Neg Hx   . COPD Neg Hx   . Depression Neg Hx   . Diabetes Neg Hx   . Drug abuse Neg Hx   . Early death Neg Hx   . Hearing loss Neg Hx   . Heart disease Neg Hx   . Hyperlipidemia Neg Hx   . Hypertension Neg Hx   . Kidney disease Neg Hx   . Learning disabilities Neg Hx   . Mental illness Neg Hx   . Mental retardation Neg Hx   . Miscarriages / Stillbirths Neg Hx   . Stroke Neg Hx   . Vision loss Neg Hx    History  Substance Use Topics  . Smoking status: Never Smoker   . Smokeless tobacco: Never Used  . Alcohol Use: No   OB History   Grav Para Term Preterm Abortions TAB SAB Ect Mult Living   1 1 1       1      Review of Systems  Constitutional: Negative.  Negative for fever.  HENT: Positive for congestion, ear pain, rhinorrhea and sore throat.   Respiratory: Negative.   Cardiovascular: Negative.   Gastrointestinal: Negative.     Allergies  Review of patient's allergies indicates no known  allergies.  Home Medications   Current Outpatient Rx  Name  Route  Sig  Dispense  Refill  . fexofenadine (ALLEGRA) 180 MG tablet   Oral   Take 1 tablet (180 mg total) by mouth daily.   30 tablet   1   . fluconazole (DIFLUCAN) 150 MG tablet   Oral   Take 1 tablet (150 mg total) by mouth daily.   1 tablet   0   . ibuprofen (ADVIL,MOTRIN) 200 MG tablet   Oral   Take 400 mg by mouth once.         Marland Kitchen ipratropium (ATROVENT) 0.06 % nasal spray   Nasal   Place 2 sprays into the nose 4 (four) times daily.   15 mL   1   . nystatin cream (MYCOSTATIN)      Apply to affected area 2 times daily   15 g   0   . triamcinolone cream (KENALOG) 0.1 %   Topical   Apply 1 application topically 2 (two) times daily.   30 g   0    BP 101/67  Pulse 63  Temp(Src) 98.3 F (36.8 C) (  Oral)  Resp 16  SpO2 100%  LMP 08/29/2013 Physical Exam  Nursing note and vitals reviewed. Constitutional: She is oriented to person, place, and time. She appears well-developed and well-nourished.  HENT:  Head: Normocephalic.  Right Ear: External ear normal.  Left Ear: External ear normal.  Mouth/Throat: Oropharynx is clear and moist.  Eyes: Conjunctivae are normal. Pupils are equal, round, and reactive to light.  Neck: Normal range of motion. Neck supple.  Cardiovascular: Normal rate and regular rhythm.   Lymphadenopathy:    She has no cervical adenopathy.  Neurological: She is alert and oriented to person, place, and time.  Skin: Skin is warm and dry.    ED Course  Procedures (including critical care time) Labs Review Labs Reviewed - No data to display Imaging Review No results found.  EKG Interpretation    Date/Time:    Ventricular Rate:    PR Interval:    QRS Duration:   QT Interval:    QTC Calculation:   R Axis:     Text Interpretation:              MDM      Linna Hoff, MD 09/17/13 1524

## 2013-09-18 ENCOUNTER — Encounter (HOSPITAL_COMMUNITY): Payer: Self-pay | Admitting: Emergency Medicine

## 2013-09-18 NOTE — ED Notes (Signed)
SEEN BY DR Artis Flock

## 2013-10-12 ENCOUNTER — Emergency Department (HOSPITAL_COMMUNITY)
Admission: EM | Admit: 2013-10-12 | Discharge: 2013-10-12 | Disposition: A | Discharge status: Home / Self Care | Payer: Medicaid Other | Source: Home / Self Care | Attending: Family Medicine | Admitting: Family Medicine

## 2013-10-12 ENCOUNTER — Encounter (HOSPITAL_COMMUNITY): Payer: Self-pay | Admitting: Emergency Medicine

## 2013-10-12 DIAGNOSIS — K120 Recurrent oral aphthae: Secondary | ICD-10-CM

## 2013-10-12 LAB — POCT RAPID STREP A: Streptococcus, Group A Screen (Direct): NEGATIVE

## 2013-10-12 MED ORDER — LIDOCAINE VISCOUS 2 % MT SOLN: OROMUCOSAL | Status: DC

## 2013-10-12 NOTE — ED Notes (Signed)
C/o sore throat. Ulcer on left side of tongue. States hard to eat. ?s causing pain in left ear.   Onset Friday.  No relief with salt water rinses.  Denies fever, n/v/d

## 2013-10-12 NOTE — ED Provider Notes (Signed)
CSN: [id number]     Arrival date & time 10/12/13  1033 History   First MD Initiated Contact with Patient 10/12/13 1251     Chief Complaint  Patient presents with  . Sore Throat  . Mouth Lesions   (Consider location/radiation/quality/duration/timing/severity/associated sxs/prior Treatment) HPI Comments: Sore is on L side of tongue; both L ear and L side of throat hurt too.   Patient is a 27 y.o. female presenting with mouth sores. The history is provided by the patient.  Mouth Lesions Location:  Tongue Quality:  Ulcerous Onset quality:  Sudden Severity:  Severe Duration:  4 days Progression:  Unchanged Chronicity:  New Relieved by:  Nothing Worsened by:  Nothing tried Ineffective treatments: salt water gargles and ibuprofen. Associated symptoms: ear pain and sore throat   Associated symptoms: no congestion, no dental pain, no fever, no rhinorrhea and no swollen glands     Past Medical History  Diagnosis Date  . No pertinent past medical history   . Abnormal Pap smear   . Anemia    Past Surgical History  Procedure Laterality Date  . Cesarean section     Family History  Problem Relation Age of Onset  . Alcohol abuse Neg Hx   . Arthritis Neg Hx   . Asthma Neg Hx   . Birth defects Neg Hx   . Cancer Neg Hx   . COPD Neg Hx   . Depression Neg Hx   . Diabetes Neg Hx   . Drug abuse Neg Hx   . Early death Neg Hx   . Hearing loss Neg Hx   . Heart disease Neg Hx   . Hyperlipidemia Neg Hx   . Hypertension Neg Hx   . Kidney disease Neg Hx   . Learning disabilities Neg Hx   . Mental illness Neg Hx   . Mental retardation Neg Hx   . Miscarriages / Stillbirths Neg Hx   . Stroke Neg Hx   . Vision loss Neg Hx    History  Substance Use Topics  . Smoking status: Never Smoker   . Smokeless tobacco: Never Used  . Alcohol Use: No   OB History   Grav Para Term Preterm Abortions TAB SAB Ect Mult Living   1 1 1       1      Review of Systems  Constitutional: Negative for  fever and chills.  HENT: Positive for ear pain, mouth sores and sore throat. Negative for congestion and rhinorrhea.   Respiratory: Negative for cough.     Allergies  Review of patient's allergies indicates no known allergies.  Home Medications   Current Outpatient Rx  Name  Route  Sig  Dispense  Refill  . fexofenadine (ALLEGRA) 180 MG tablet   Oral   Take 1 tablet (180 mg total) by mouth daily.   30 tablet   1   . fluconazole (DIFLUCAN) 150 MG tablet   Oral   Take 1 tablet (150 mg total) by mouth daily.   1 tablet   0   . ibuprofen (ADVIL,MOTRIN) 200 MG tablet   Oral   Take 400 mg by mouth once.         Marland Kitchen ipratropium (ATROVENT) 0.06 % nasal spray   Nasal   Place 2 sprays into the nose 4 (four) times daily.   15 mL   1   . lidocaine (XYLOCAINE) 2 % solution      Apply as needed to mouth sores  100 mL   0   . nystatin cream (MYCOSTATIN)      Apply to affected area 2 times daily   15 g   0   . triamcinolone cream (KENALOG) 0.1 %   Topical   Apply 1 application topically 2 (two) times daily.   30 g   0    BP 98/64  Pulse 59  Temp(Src) 97.9 F (36.6 C) (Oral)  Resp 16  SpO2 100%  LMP 09/28/2013 Physical Exam  Constitutional: She appears well-developed and well-nourished. She does not appear ill. No distress.  HENT:  Right Ear: Tympanic membrane, external ear and ear canal normal.  Left Ear: Tympanic membrane, external ear and ear canal normal.  Nose: Nose normal.  Mouth/Throat: Oropharynx is clear and moist. Oral lesions present.  Ulceration on L side of tongue near back of tongue  Cardiovascular: Normal rate and regular rhythm.   Pulmonary/Chest: Effort normal and breath sounds normal.    ED Course  Procedures (including critical care time) Labs Review Labs Reviewed  POCT RAPID STREP A ([facility] CARE ONLY)   Imaging Review No results found.  EKG Interpretation    Date/Time:    Ventricular Rate:    PR Interval:    QRS Duration:   QT  Interval:    QTC Calculation:   R Axis:     Text Interpretation:              MDM   1. Aphthous ulcer of tongue   pt to continue salt water gargles and ibuprofen. Rx lidocaine mucosal solution 2%, apply as needed to ulcer #183mL     Cathlyn Parsons, NP 10/12/13 1255

## 2013-10-14 LAB — CULTURE, GROUP A STREP

## 2013-10-14 NOTE — ED Provider Notes (Signed)
Medical screening examination/treatment/procedure(s) were performed by a resident physician or non-physician practitioner and as the supervising physician I was immediately available for consultation/collaboration.  Amaranta Mehl, MD    Trice Aspinall S Dona Walby, MD 10/14/13 0755 

## 2013-11-19 ENCOUNTER — Encounter (HOSPITAL_COMMUNITY): Payer: Self-pay | Admitting: *Deleted

## 2013-11-19 ENCOUNTER — Inpatient Hospital Stay (HOSPITAL_COMMUNITY)
Admission: AD | Admit: 2013-11-19 | Discharge: 2013-11-19 | Disposition: A | Discharge status: Home / Self Care | Payer: Medicaid Other | Source: Ambulatory Visit | Attending: Obstetrics & Gynecology | Admitting: Obstetrics & Gynecology

## 2013-11-19 DIAGNOSIS — N898 Other specified noninflammatory disorders of vagina: Secondary | ICD-10-CM | POA: Insufficient documentation

## 2013-11-19 DIAGNOSIS — B373 Candidiasis of vulva and vagina: Secondary | ICD-10-CM

## 2013-11-19 DIAGNOSIS — B3731 Acute candidiasis of vulva and vagina: Secondary | ICD-10-CM | POA: Insufficient documentation

## 2013-11-19 DIAGNOSIS — L293 Anogenital pruritus, unspecified: Secondary | ICD-10-CM | POA: Insufficient documentation

## 2013-11-19 LAB — WET PREP, GENITAL
Clue Cells Wet Prep HPF POC: NONE SEEN
TRICH WET PREP: NONE SEEN
Yeast Wet Prep HPF POC: NONE SEEN

## 2013-11-19 MED ORDER — FLUCONAZOLE 150 MG PO TABS: 150.0000 mg | ORAL_TABLET | Freq: Every day | ORAL | Status: DC

## 2013-11-19 MED ORDER — ACIDOPHILUS PROBIOTIC BLEND PO CAPS: 1.0000 | ORAL_CAPSULE | Freq: Every day | ORAL | Status: DC

## 2013-11-19 NOTE — MAU Provider Note (Signed)
History     CSN: [id number]  Arrival date and time: 11/19/13 1655   None     Chief Complaint  Patient presents with  . Vaginal Discharge   HPI This is a 28 y.o. female who presents with c/o vaginal itching, similar to previous yeast infections. States she gets them often. Has white discharge with an odor.  Declines STD testing. No other complaints  RN Note:  Pt states she is having vaginal discharge with an unusual scent.pt states she is also having a vaginal itching. Pt states she has had this discharge for abour 3 days       OB History   Grav Para Term Preterm Abortions TAB SAB Ect Mult Living   1 1 1       1       Past Medical History  Diagnosis Date  . No pertinent past medical history   . Abnormal Pap smear   . Anemia     Past Surgical History  Procedure Laterality Date  . Cesarean section      Family History  Problem Relation Age of Onset  . Alcohol abuse Neg Hx   . Arthritis Neg Hx   . Asthma Neg Hx   . Birth defects Neg Hx   . Cancer Neg Hx   . COPD Neg Hx   . Depression Neg Hx   . Diabetes Neg Hx   . Drug abuse Neg Hx   . Early death Neg Hx   . Hearing loss Neg Hx   . Heart disease Neg Hx   . Hyperlipidemia Neg Hx   . Hypertension Neg Hx   . Kidney disease Neg Hx   . Learning disabilities Neg Hx   . Mental illness Neg Hx   . Mental retardation Neg Hx   . Miscarriages / Stillbirths Neg Hx   . Stroke Neg Hx   . Vision loss Neg Hx     History  Substance Use Topics  . Smoking status: Never Smoker   . Smokeless tobacco: Never Used  . Alcohol Use: No    Allergies: No Known Allergies  Prescriptions prior to admission  Medication Sig Dispense Refill  . IRON PO Take 1 tablet by mouth daily.        Review of Systems  Constitutional: Negative for fever and chills.  Gastrointestinal: Negative for nausea, vomiting and abdominal pain.  Genitourinary:       Vaginal itching Vaginal discharge   Neurological: Negative for headaches.    Physical Exam   Blood pressure 109/58, pulse 66, temperature 98.3 F (36.8 C), temperature source Oral, resp. rate 16, height 5' (1.524 m), weight 56.881 kg (125 lb 6.4 oz).  Physical Exam  Constitutional: She is oriented to person, place, and time. She appears well-developed and well-nourished. No distress.  Cardiovascular: Normal rate.   Respiratory: Effort normal.  GI: Soft. There is no tenderness. There is no rebound and no guarding.  Genitourinary: Uterus normal. Vaginal discharge (minimal, thin) found.  No erethema, but pt states it itches/burns with swab touch  Musculoskeletal: Normal range of motion.  Neurological: She is alert and oriented to person, place, and time.  Skin: Skin is warm and dry.  Psychiatric: She has a normal mood and affect.    MAU Course  Procedures  MDM Results for orders placed during the hospital encounter of 11/19/13 (from the past 24 hour(s))  WET PREP, GENITAL     Status: Abnormal   Collection Time    11/19/13  6:10 PM      Result Value Range   Yeast Wet Prep HPF POC NONE SEEN  NONE SEEN   Trich, Wet Prep NONE SEEN  NONE SEEN   Clue Cells Wet Prep HPF POC NONE SEEN  NONE SEEN   WBC, Wet Prep HPF POC FEW (*) NONE SEEN     Assessment and Plan  A:  Vaginal itching       Clinical diagnosis of yeast vaginitis  P:  Discussed negative wet prep       Offered expectant management or Rx       Pt desires Rx diflucan, discussed if this is not yeast, may be contact dermatitis       Advised to start probiotics due to frequent yeast infections.   [facility]woodWILLIAMS,Shatera Rennert 11/19/2013, 5:33 PM

## 2013-11-19 NOTE — MAU Note (Signed)
Patient states she has had a vaginal itch for one week. Denies pain, bleeding or discharge.

## 2013-11-19 NOTE — Discharge Instructions (Signed)
Candidal Vulvovaginitis  Candidal vulvovaginitis is an infection of the vagina and vulva. The vulva is the skin around the opening of the vagina. This may cause itching and discomfort in and around the vagina.   HOME CARE  · Only take medicine as told by your doctor.  · Do not have sex (intercourse) until the infection is healed or as told by your doctor.  · Practice safe sex.  · Tell your sex partner about your infection.  · Do not douche or use tampons.  · Wear cotton underwear. Do not wear tight pants or panty hose.  · Eat yogurt. This may help treat and prevent yeast infections.  GET HELP RIGHT AWAY IF:   · You have a fever.  · Your problems get worse during treatment or do not get better in 3 days.  · You have discomfort, irritation, or itching in your vagina or vulva area.  · You have pain after sex.  · You start to get belly (abdominal) pain.  MAKE SURE YOU:  · Understand these instructions.  · Will watch your condition.  · Will get help right away if you are not doing well or get worse.  Document Released: 01/11/2009 Document Revised: 01/07/2012 Document Reviewed: 01/11/2009  [employer]® Patient Information ©2014 [employer].

## 2013-11-19 NOTE — MAU Note (Signed)
Pt states she is having vaginal discharge with an unusual scent.pt states she is also having a vaginal itching. Pt states she has had this discharge for abour 3 days

## 2013-11-27 NOTE — MAU Provider Note (Signed)
Attestation of Attending Supervision of Advanced Practitioner (CNM/NP): Evaluation and management procedures were performed by the Advanced Practitioner under my supervision and collaboration. I have reviewed the Advanced Practitioner's note and chart, and I agree with the management and plan.  Dejah Droessler H. 1:33 PM   

## 2014-01-19 ENCOUNTER — Inpatient Hospital Stay (HOSPITAL_COMMUNITY)
Admission: AD | Admit: 2014-01-19 | Discharge: 2014-01-19 | Disposition: A | Discharge status: Home / Self Care | Payer: Medicaid Other | Source: Ambulatory Visit | Attending: Family Medicine | Admitting: Family Medicine

## 2014-01-19 ENCOUNTER — Encounter (HOSPITAL_COMMUNITY): Payer: Self-pay | Admitting: *Deleted

## 2014-01-19 DIAGNOSIS — L293 Anogenital pruritus, unspecified: Secondary | ICD-10-CM | POA: Insufficient documentation

## 2014-01-19 DIAGNOSIS — B3731 Acute candidiasis of vulva and vagina: Secondary | ICD-10-CM | POA: Insufficient documentation

## 2014-01-19 DIAGNOSIS — B373 Candidiasis of vulva and vagina: Secondary | ICD-10-CM | POA: Insufficient documentation

## 2014-01-19 DIAGNOSIS — R109 Unspecified abdominal pain: Secondary | ICD-10-CM | POA: Insufficient documentation

## 2014-01-19 DIAGNOSIS — N898 Other specified noninflammatory disorders of vagina: Secondary | ICD-10-CM | POA: Insufficient documentation

## 2014-01-19 HISTORY — DX: Anxiety disorder, unspecified: F41.9

## 2014-01-19 LAB — URINALYSIS, ROUTINE W REFLEX MICROSCOPIC
Bilirubin Urine: NEGATIVE
GLUCOSE, UA: NEGATIVE mg/dL
Ketones, ur: NEGATIVE mg/dL
LEUKOCYTES UA: NEGATIVE
Nitrite: NEGATIVE
PH: 6 (ref 5.0–8.0)
PROTEIN: NEGATIVE mg/dL
SPECIFIC GRAVITY, URINE: 1.02 (ref 1.005–1.030)
Urobilinogen, UA: 0.2 mg/dL (ref 0.0–1.0)

## 2014-01-19 LAB — WET PREP, GENITAL
CLUE CELLS WET PREP: NONE SEEN
TRICH WET PREP: NONE SEEN
Yeast Wet Prep HPF POC: NONE SEEN

## 2014-01-19 LAB — URINE MICROSCOPIC-ADD ON

## 2014-01-19 LAB — POCT PREGNANCY, URINE: Preg Test, Ur: NEGATIVE

## 2014-01-19 MED ORDER — TERCONAZOLE 0.4 % VA CREA: 1.0000 | TOPICAL_CREAM | Freq: Every day | VAGINAL | Status: DC

## 2014-01-19 NOTE — MAU Provider Note (Signed)
History     CSN: [id number]  Arrival date and time: 01/19/14 1136   First Provider Initiated Contact with Patient 01/19/14 1152      Chief Complaint  Patient presents with  . Vaginal Itching  . Abdominal Pain   HPI  [name] is a 28 y.o. female G1P1001 who presents to MAU with complaints of vaginal irritation that started around March 15. She describes the irritation as "very itchy", denies vaginal odor. She is also experiencing pressure during urination on her left side of her abdomen. She admits to drinking several soda's per day.   OB History   Grav Para Term Preterm Abortions TAB SAB Ect Mult Living   1 1 1       1       Past Medical History  Diagnosis Date  . No pertinent past medical history   . Abnormal Pap smear   . Anemia   . Anxiety     worse when on meds    Past Surgical History  Procedure Laterality Date  . Cesarean section      Family History  Problem Relation Age of Onset  . Alcohol abuse Neg Hx   . Arthritis Neg Hx   . Asthma Neg Hx   . Birth defects Neg Hx   . Cancer Neg Hx   . COPD Neg Hx   . Depression Neg Hx   . Diabetes Neg Hx   . Drug abuse Neg Hx   . Early death Neg Hx   . Hearing loss Neg Hx   . Heart disease Neg Hx   . Hyperlipidemia Neg Hx   . Hypertension Neg Hx   . Kidney disease Neg Hx   . Learning disabilities Neg Hx   . Mental illness Neg Hx   . Mental retardation Neg Hx   . Miscarriages / Stillbirths Neg Hx   . Stroke Neg Hx   . Vision loss Neg Hx     History  Substance Use Topics  . Smoking status: Never Smoker   . Smokeless tobacco: Never Used  . Alcohol Use: No    Allergies: No Known Allergies  Prescriptions prior to admission  Medication Sig Dispense Refill  . fluconazole (DIFLUCAN) 150 MG tablet Take 1 tablet (150 mg total) by mouth daily.  1 tablet  1  . IRON PO Take 1 tablet by mouth daily.      . Probiotic Product (ACIDOPHILUS PROBIOTIC BLEND) CAPS Take 1 capsule by mouth daily.  30 capsule   0   Results for orders placed during the hospital encounter of 01/19/14 (from the past 48 hour(s))  URINALYSIS, ROUTINE W REFLEX MICROSCOPIC     Status: Abnormal   Collection Time    01/19/14 11:50 AM      Result Value Ref Range   Color, Urine YELLOW  YELLOW   APPearance CLEAR  CLEAR   Specific Gravity, Urine 1.020  1.005 - 1.030   pH 6.0  5.0 - 8.0   Glucose, UA NEGATIVE  NEGATIVE mg/dL   Hgb urine dipstick TRACE (*) NEGATIVE   Bilirubin Urine NEGATIVE  NEGATIVE   Ketones, ur NEGATIVE  NEGATIVE mg/dL   Protein, ur NEGATIVE  NEGATIVE mg/dL   Urobilinogen, UA 0.2  0.0 - 1.0 mg/dL   Nitrite NEGATIVE  NEGATIVE   Leukocytes, UA NEGATIVE  NEGATIVE  URINE MICROSCOPIC-ADD ON     Status: Abnormal   Collection Time    01/19/14 11:50 AM  Result Value Ref Range   Squamous Epithelial / LPF MANY (*) RARE   WBC, UA 0-2  <3 WBC/hpf   RBC / HPF 0-2  <3 RBC/hpf   Bacteria, UA FEW (*) RARE  WET PREP, GENITAL     Status: Abnormal   Collection Time    01/19/14 12:05 PM      Result Value Ref Range   Yeast Wet Prep HPF POC NONE SEEN  NONE SEEN   Trich, Wet Prep NONE SEEN  NONE SEEN   Clue Cells Wet Prep HPF POC NONE SEEN  NONE SEEN   WBC, Wet Prep HPF POC FEW (*) NONE SEEN   Comment: BACTERIA- TOO NUMEROUS TO COUNT  POCT PREGNANCY, URINE     Status: None   Collection Time    01/19/14 12:06 PM      Result Value Ref Range   Preg Test, Ur NEGATIVE  NEGATIVE   Comment:            THE SENSITIVITY OF THIS     METHODOLOGY IS >24 mIU/mL    Review of Systems  Constitutional: Negative for fever and chills.  Gastrointestinal: Positive for nausea and abdominal pain (+Left lower abdominal pain that radiates to her back. ). Negative for vomiting.  Genitourinary: Positive for dysuria, urgency and flank pain.       + vaginal discharge. No vaginal bleeding. Dysuria when she urinates "burns on the outside".  + vaginal itching     Physical Exam   Blood pressure 104/58, pulse 60, temperature 97.7  F (36.5 C), temperature source Oral, resp. rate 16, last menstrual period 01/11/2014.  Physical Exam  Constitutional: She is oriented to person, place, and time. She appears well-developed and well-nourished. No distress.  Respiratory: Effort normal.  GI: Soft. She exhibits no distension and no mass. There is no tenderness. There is no rebound and no guarding.  Genitourinary: Vaginal discharge found.  Speculum exam: Vagina - Large amount of creamy, thin discharge, no odor, no erythema noted  Cervix - No contact bleeding, no active bleeding  Bimanual exam: Cervix closed, mild CMT  Uterus non tender, normal size Adnexa non tender, no masses bilaterally, negative suprapubic tenderness  GC/Chlam, wet prep done Chaperone present for exam.   Neurological: She is alert and oriented to person, place, and time.  Skin: Skin is warm. She is not diaphoretic.  Psychiatric: Her behavior is normal.    MAU Course  Procedures None  MDM UA UPT Wet prep GC- pending   Assessment and Plan   A:  Vaginal discharge Presumed yeast vaginitis   P:  Discharge home in stable condition Pt has a refill on diflucan from last visit; informed patient ok to take RX: terazol cream  Condoms always   Iona HansenJennifer Irene Rasch, NP  01/19/2014, 1:03 PM

## 2014-01-19 NOTE — MAU Provider Note (Signed)
Attestation of Attending Supervision of Advanced Practitioner (PA/CNM/NP): Evaluation and management procedures were performed by the Advanced Practitioner under my supervision and collaboration.  I have reviewed the Advanced Practitioner's note and chart, and I agree with the management and plan.  Reva BoresPRATT,Jalyssa Fleisher S, MD [facility] Attending 01/19/2014 2:59 PM

## 2014-01-19 NOTE — MAU Note (Addendum)
Vaginal itching started after cycle ended. Has been going on a few days.  Pain in left side only  With urination, also started a few days ago.  Neg CVA tenderness

## 2014-01-20 LAB — GC/CHLAMYDIA PROBE AMP
CT PROBE, AMP APTIMA: NEGATIVE
GC Probe RNA: NEGATIVE

## 2014-03-14 ENCOUNTER — Encounter (HOSPITAL_COMMUNITY): Payer: Self-pay | Admitting: Emergency Medicine

## 2014-03-14 ENCOUNTER — Emergency Department (HOSPITAL_COMMUNITY)
Admission: EM | Admit: 2014-03-14 | Discharge: 2014-03-14 | Disposition: A | Discharge status: Home / Self Care | Payer: Medicaid Other | Attending: Emergency Medicine | Admitting: Emergency Medicine

## 2014-03-14 DIAGNOSIS — R05 Cough: Secondary | ICD-10-CM | POA: Insufficient documentation

## 2014-03-14 DIAGNOSIS — R5381 Other malaise: Secondary | ICD-10-CM | POA: Insufficient documentation

## 2014-03-14 DIAGNOSIS — R110 Nausea: Secondary | ICD-10-CM

## 2014-03-14 DIAGNOSIS — Z8659 Personal history of other mental and behavioral disorders: Secondary | ICD-10-CM | POA: Insufficient documentation

## 2014-03-14 DIAGNOSIS — R059 Cough, unspecified: Secondary | ICD-10-CM | POA: Insufficient documentation

## 2014-03-14 DIAGNOSIS — R350 Frequency of micturition: Secondary | ICD-10-CM | POA: Insufficient documentation

## 2014-03-14 DIAGNOSIS — R5383 Other fatigue: Secondary | ICD-10-CM

## 2014-03-14 DIAGNOSIS — Z79899 Other long term (current) drug therapy: Secondary | ICD-10-CM | POA: Insufficient documentation

## 2014-03-14 DIAGNOSIS — R51 Headache: Secondary | ICD-10-CM | POA: Insufficient documentation

## 2014-03-14 DIAGNOSIS — R634 Abnormal weight loss: Secondary | ICD-10-CM | POA: Insufficient documentation

## 2014-03-14 DIAGNOSIS — Z3202 Encounter for pregnancy test, result negative: Secondary | ICD-10-CM | POA: Insufficient documentation

## 2014-03-14 DIAGNOSIS — D649 Anemia, unspecified: Secondary | ICD-10-CM | POA: Insufficient documentation

## 2014-03-14 LAB — BASIC METABOLIC PANEL
BUN: 9 mg/dL (ref 6–23)
CHLORIDE: 102 meq/L (ref 96–112)
CO2: 26 mEq/L (ref 19–32)
Calcium: 9.7 mg/dL (ref 8.4–10.5)
Creatinine, Ser: 0.77 mg/dL (ref 0.50–1.10)
GFR calc non Af Amer: >90 mL/min (ref >90)
Glucose, Bld: 88 mg/dL (ref 70–99)
POTASSIUM: 4.1 meq/L (ref 3.7–5.3)
Sodium: 138 mEq/L (ref 137–147)

## 2014-03-14 LAB — CBC WITH DIFFERENTIAL/PLATELET
BASOS PCT: 0 % (ref 0–1)
Basophils Absolute: 0 10*3/uL (ref 0.0–0.1)
EOS ABS: 0.1 10*3/uL (ref 0.0–0.7)
Eosinophils Relative: 3 % (ref 0–5)
HCT: 32.7 % — ABNORMAL LOW (ref 36.0–46.0)
Hemoglobin: 11.3 g/dL — ABNORMAL LOW (ref 12.0–15.0)
LYMPHS ABS: 2.3 10*3/uL (ref 0.7–4.0)
Lymphocytes Relative: 48 % — ABNORMAL HIGH (ref 12–46)
MCH: 29.2 pg (ref 26.0–34.0)
MCHC: 34.6 g/dL (ref 30.0–36.0)
MCV: 84.5 fL (ref 78.0–100.0)
Monocytes Absolute: 0.3 10*3/uL (ref 0.1–1.0)
Monocytes Relative: 7 % (ref 3–12)
NEUTROS ABS: 2 10*3/uL (ref 1.7–7.7)
Neutrophils Relative %: 42 % — ABNORMAL LOW (ref 43–77)
PLATELETS: 285 10*3/uL (ref 150–400)
RBC: 3.87 MIL/uL (ref 3.87–5.11)
RDW: 12.9 % (ref 11.5–15.5)
WBC: 4.8 10*3/uL (ref 4.0–10.5)

## 2014-03-14 LAB — URINALYSIS, ROUTINE W REFLEX MICROSCOPIC
Bilirubin Urine: NEGATIVE
GLUCOSE, UA: NEGATIVE mg/dL
Hgb urine dipstick: NEGATIVE
KETONES UR: NEGATIVE mg/dL
LEUKOCYTES UA: NEGATIVE
NITRITE: NEGATIVE
Protein, ur: NEGATIVE mg/dL
SPECIFIC GRAVITY, URINE: 1.014 (ref 1.005–1.030)
Urobilinogen, UA: 1 mg/dL (ref 0.0–1.0)
pH: 7.5 (ref 5.0–8.0)

## 2014-03-14 LAB — TSH: TSH: 2.26 u[IU]/mL (ref 0.350–4.500)

## 2014-03-14 LAB — POC URINE PREG, ED: Preg Test, Ur: NEGATIVE

## 2014-03-14 MED ORDER — ONDANSETRON 4 MG PO TBDP
4.0000 mg | ORAL_TABLET | Freq: Once | ORAL | Status: AC
Start: 2014-03-14 — End: 2014-03-14
  Administered 2014-03-14: 4 mg via ORAL
  Filled 2014-03-14: qty 1

## 2014-03-14 MED ORDER — PROMETHAZINE HCL 25 MG PO TABS: 25.0000 mg | ORAL_TABLET | Freq: Four times a day (QID) | ORAL | Status: DC | PRN

## 2014-03-14 NOTE — ED Provider Notes (Addendum)
TIME SEEN: 1:55 PM  CHIEF COMPLAINT: Generalized weakness, fatigue, nausea, weight loss, urinary frequency, nasal congestion  HPI: Patient is a 28 year old G1 P1 who presents emergency department with several months of intermittent episodes of feeling fatigued, generally weak and nauseous. She states she does have a history of anemia but none denies a history of transfusion. She states that her last menstrual period was May 3 in that she does have heavy menstrual cycles that last for approximately 7 days and she has to use approximately 5 pads in 24 hours. She states that since November 2014 she's lost approximately 10 pounds without trying. She states over the last several days she's had nasal congestion and dry cough. She has had intermittent urinary frequency. No vomiting or diarrhea. No fevers or chills. No chest pain or shortness of breath. No numbness or focal weakness.  ROS: See HPI Constitutional: no fever  Eyes: no drainage  ENT: no runny nose   Cardiovascular:  no chest pain  Resp: no SOB  GI: no vomiting GU: no dysuria Integumentary: no rash  Allergy: no hives  Musculoskeletal: no leg swelling  Neurological: no slurred speech ROS otherwise negative  PAST MEDICAL HISTORY/PAST SURGICAL HISTORY:  Past Medical History  Diagnosis Date  . No pertinent past medical history   . Abnormal Pap smear   . Anemia   . Anxiety     worse when on meds    MEDICATIONS:  Prior to Admission medications   Medication Sig Start Date End Date Taking? Authorizing Provider  cetirizine (ZYRTEC) 10 MG tablet Take 10 mg by mouth daily as needed for allergies.   Yes Historical Provider, MD  ibuprofen (ADVIL,MOTRIN) 200 MG tablet Take 600 mg by mouth every 6 (six) hours as needed for moderate pain.   Yes Historical Provider, MD  IRON PO Take 1 tablet by mouth daily.   Yes Historical Provider, MD    ALLERGIES:  No Known Allergies  SOCIAL HISTORY:  History  Substance Use Topics  . Smoking status:  Never Smoker   . Smokeless tobacco: Never Used  . Alcohol Use: Yes     Comment: occ    FAMILY HISTORY: Family History  Problem Relation Age of Onset  . Alcohol abuse Neg Hx   . Arthritis Neg Hx   . Asthma Neg Hx   . Birth defects Neg Hx   . Cancer Neg Hx   . COPD Neg Hx   . Depression Neg Hx   . Diabetes Neg Hx   . Drug abuse Neg Hx   . Early death Neg Hx   . Hearing loss Neg Hx   . Heart disease Neg Hx   . Hyperlipidemia Neg Hx   . Hypertension Neg Hx   . Kidney disease Neg Hx   . Learning disabilities Neg Hx   . Mental illness Neg Hx   . Mental retardation Neg Hx   . Miscarriages / Stillbirths Neg Hx   . Stroke Neg Hx   . Vision loss Neg Hx     EXAM: BP 94/62  Pulse 62  Temp(Src) 97.7 F (36.5 C)  Resp 18  Wt 120 lb 4 oz (54.545 kg)  SpO2 100%  LMP 02/28/2014 CONSTITUTIONAL: Alert and oriented and responds appropriately to questions. Well-appearing; well-nourished, well-hydrated, in no apparent distress, nontoxic HEAD: Normocephalic EYES: Conjunctivae clear, PERRL, no conjunctival pallor ENT: normal nose; no rhinorrhea; moist mucous membranes; pharynx without lesions noted NECK: Supple, no meningismus, no LAD  CARD: RRR; S1 and  S2 appreciated; no murmurs, no clicks, no rubs, no gallops RESP: Normal chest excursion without splinting or tachypnea; breath sounds clear and equal bilaterally; no wheezes, no rhonchi, no rales,  ABD/GI: Normal bowel sounds; non-distended; soft, non-tender, no rebound, no guarding BACK:  The back appears normal and is non-tender to palpation, there is no CVA tenderness EXT: Normal ROM in all joints; non-tender to palpation; no edema; normal capillary refill; no cyanosis    SKIN: Normal color for age and race; warm NEURO: Moves all extremities equally, sensation to light touch intact diffusely PSYCH: The patient's mood and manner are appropriate. Grooming and personal hygiene are appropriate.  MEDICAL DECISION MAKING: Patient here with  vague symptoms of weakness and fatigue. Differential diagnosis is large and includes anemia, electrolyte abnormality, infection, pregnancy. We'll check basic labs, TSH, urine analysis and urine pregnancy test. We'll give oral Zofran for her complaints of nausea. Anticipate if patient's workup is negative that she can be discharged home with outpatient followup. Her PCP is Dr. Mayford KnifeWilliams on [address]  ED PROGRESS: Patient's labs and urine are unremarkable. Her hemoglobin is 11.3. Her pregnancy test is negative. Electrolytes normal. We'll discharge home with prescription for Phenergan which she is requesting for her intermittent nausea. She will followup with her primary care physician next week. Have discussed with patient I do not feel there is any life-threatening illness at this time but recommend close outpatient followup for her symptoms. Patient verbalizes understanding and is comfortable with this plan.     Layla MawKristen N Monte Zinni, DO 03/14/14 1510   Patient reports she has had intermittent depression but none currently. No SI or HI. No hallucinations.  Layla MawKristen N Nickalos Petersen, DO 03/14/14 1511

## 2014-03-14 NOTE — ED Notes (Signed)
Pt reports one month hx of weakness, fatigue, nausea, weight loss. Frequent urination and increased fatigue this week

## 2014-03-14 NOTE — Discharge Instructions (Signed)

## 2014-04-13 ENCOUNTER — Encounter (HOSPITAL_COMMUNITY): Payer: Self-pay | Admitting: *Deleted

## 2014-04-13 ENCOUNTER — Inpatient Hospital Stay (HOSPITAL_COMMUNITY)
Admission: AD | Admit: 2014-04-13 | Discharge: 2014-04-13 | Discharge status: Against Medical Advice | Payer: Medicaid Other | Source: Ambulatory Visit | Attending: Obstetrics & Gynecology | Admitting: Obstetrics & Gynecology

## 2014-04-13 DIAGNOSIS — N898 Other specified noninflammatory disorders of vagina: Secondary | ICD-10-CM | POA: Insufficient documentation

## 2014-04-13 DIAGNOSIS — K120 Recurrent oral aphthae: Secondary | ICD-10-CM

## 2014-04-13 DIAGNOSIS — R300 Dysuria: Secondary | ICD-10-CM | POA: Insufficient documentation

## 2014-04-13 LAB — URINALYSIS, ROUTINE W REFLEX MICROSCOPIC
Bilirubin Urine: NEGATIVE
Glucose, UA: NEGATIVE mg/dL
Hgb urine dipstick: NEGATIVE
Ketones, ur: NEGATIVE mg/dL
Leukocytes, UA: NEGATIVE
NITRITE: NEGATIVE
Protein, ur: NEGATIVE mg/dL
SPECIFIC GRAVITY, URINE: 1.01 (ref 1.005–1.030)
UROBILINOGEN UA: 0.2 mg/dL (ref 0.0–1.0)
pH: 7.5 (ref 5.0–8.0)

## 2014-04-13 LAB — WET PREP, GENITAL
CLUE CELLS WET PREP: NONE SEEN
TRICH WET PREP: NONE SEEN
YEAST WET PREP: NONE SEEN

## 2014-04-13 LAB — POCT PREGNANCY, URINE: PREG TEST UR: NEGATIVE

## 2014-04-13 NOTE — MAU Note (Signed)
Pt came to desk and states she has to leave to go to work.AMA signed. Dr Ike Benedom notified of pt's departure

## 2014-04-13 NOTE — MAU Provider Note (Signed)
None     Chief Complaint:  Dysuria   Kellie Davila is  28 y.o. G1P1001.  Patient's last menstrual period was 03/30/2014.Marland Kitchen.  Her pregnancy status is negative.  She presents complaining of pressure with urination and white discharge. Small amount, noticed primarily when urinating. Discomfort in vagina for the last week.  Pt denies hx of STD.  LMP: 2june15.  OB History   Grav Para Term Preterm Abortions TAB SAB Ect Mult Living   1 1 1       1        Past Medical History  Diagnosis Date  . No pertinent past medical history   . Abnormal Pap smear   . Anemia   . Anxiety     worse when on meds    Past Surgical History  Procedure Laterality Date  . Cesarean section      Family History  Problem Relation Age of Onset  . Alcohol abuse Neg Hx   . Arthritis Neg Hx   . Asthma Neg Hx   . Birth defects Neg Hx   . Cancer Neg Hx   . COPD Neg Hx   . Depression Neg Hx   . Diabetes Neg Hx   . Drug abuse Neg Hx   . Early death Neg Hx   . Hearing loss Neg Hx   . Heart disease Neg Hx   . Hyperlipidemia Neg Hx   . Hypertension Neg Hx   . Kidney disease Neg Hx   . Learning disabilities Neg Hx   . Mental illness Neg Hx   . Mental retardation Neg Hx   . Miscarriages / Stillbirths Neg Hx   . Stroke Neg Hx   . Vision loss Neg Hx     History  Substance Use Topics  . Smoking status: Never Smoker   . Smokeless tobacco: Never Used  . Alcohol Use: Yes     Comment: occ    Allergies: No Known Allergies  No prescriptions prior to admission     Review of Systems  Review of Systems  Constitutional: Negative for fever, chills, weight loss, malaise/fatigue and diaphoresis.  HENT: Negative for hearing loss, ear pain, nosebleeds, congestion, sore throat, neck pain, tinnitus and ear discharge.   Eyes: Negative for blurred vision, double vision, photophobia, pain, discharge and redness.  Respiratory: Negative for cough, hemoptysis, sputum production, shortness of breath, wheezing and  stridor.   Cardiovascular: Negative for chest pain, palpitations, orthopnea,  leg swelling  Gastrointestinal: Negative for abdominal pain heartburn, nausea, vomiting, diarrhea, constipation, blood in stool Genitourinary: Negative for dysuria, urgency, frequency, hematuria and flank pain.  Musculoskeletal: Negative for myalgias, back pain, joint pain and falls.  Skin: Negative for itching and rash.  Neurological: Negative for dizziness, tingling, tremors, sensory change, speech change, focal weakness, seizures, loss of consciousness, weakness and headaches.  Endo/Heme/Allergies: Negative for environmental allergies and polydipsia. Does not bruise/bleed easily.  Psychiatric/Behavioral: Negative for depression, suicidal ideas, hallucinations, memory loss and substance abuse. The patient is not nervous/anxious and does not have insomnia.      Physical Exam   Blood pressure 99/64, pulse 66, temperature 98.5 F (36.9 C), temperature source Oral, resp. rate 18, height 5\' 2"  (1.575 m), weight 56.7 kg (125 lb), last menstrual period 03/30/2014.  General: General appearance - alert, well appearing, and in no distress, oriented to person, place, and time and well hydrated Chest - clear to auscultation, no wheezes, rales or rhonchi, symmetric air entry Heart - normal rate, regular  rhythm, normal S1, S2, no murmurs, rubs, clicks or gallops Abdomen - soft, nontender, nondistended, no masses or organomegaly, no CVAT Pelvic - copious white dishcarge, non frothy, no CMT Extremities - peripheral pulses normal, no pedal edema, no clubbing or cyanosis   Labs: Results for orders placed during the hospital encounter of 04/13/14 (from the past 24 hour(s))  URINALYSIS, ROUTINE W REFLEX MICROSCOPIC   Collection Time    04/13/14 11:27 AM      Result Value Ref Range   Color, Urine YELLOW  YELLOW   APPearance CLEAR  CLEAR   Specific Gravity, Urine 1.010  1.005 - 1.030   pH 7.5  5.0 - 8.0   Glucose, UA  NEGATIVE  NEGATIVE mg/dL   Hgb urine dipstick NEGATIVE  NEGATIVE   Bilirubin Urine NEGATIVE  NEGATIVE   Ketones, ur NEGATIVE  NEGATIVE mg/dL   Protein, ur NEGATIVE  NEGATIVE mg/dL   Urobilinogen, UA 0.2  0.0 - 1.0 mg/dL   Nitrite NEGATIVE  NEGATIVE   Leukocytes, UA NEGATIVE  NEGATIVE  POCT PREGNANCY, URINE   Collection Time    04/13/14 11:47 AM      Result Value Ref Range   Preg Test, Ur NEGATIVE  NEGATIVE  WET PREP, GENITAL   Collection Time    04/13/14 12:20 PM      Result Value Ref Range   Yeast Wet Prep HPF POC NONE SEEN  NONE SEEN   Trich, Wet Prep NONE SEEN  NONE SEEN   Clue Cells Wet Prep HPF POC NONE SEEN  NONE SEEN   WBC, Wet Prep HPF POC MODERATE (*) NONE SEEN   Imaging Studies:  No results found.   Assessment: Kellie Davila is a 28 y.o. G1P1001 with pressure when urinating and vaginal discharge. Last intercourse 1 week ago, prior to onset of symptoms with new partner.  - GC/C Pending, no CMT, no need to tx prior to results - wet mount neg - no evidence of UTI, but with symptoms, submitted for UTI.  ODOM, MICHAEL RYAN

## 2014-04-13 NOTE — Progress Notes (Signed)
Dr Ike Benedom notified of wet prep results. Will come see pt and d/c home.

## 2014-04-13 NOTE — MAU Provider Note (Signed)
Attestation of Attending Supervision of Obstetric Fellow: Evaluation and management procedures were performed by the Obstetric Fellow under my supervision and collaboration.  I have reviewed the Obstetric Fellow's note and chart, and I agree with the management and plan.  Massai Hankerson, MD, FACOG Attending Obstetrician & Gynecologist Faculty Practice, [facility]   

## 2014-04-13 NOTE — MAU Note (Signed)
Patient presents with complaint of dysuria X 1 week.

## 2014-04-14 LAB — GC/CHLAMYDIA PROBE AMP
CT Probe RNA: NEGATIVE
GC PROBE AMP APTIMA: NEGATIVE

## 2014-04-15 ENCOUNTER — Telehealth: Payer: Self-pay | Admitting: Family Medicine

## 2014-04-15 LAB — URINE CULTURE: Colony Count: 75000

## 2014-04-15 MED ORDER — NITROFURANTOIN MONOHYD MACRO 100 MG PO CAPS
100.0000 mg | ORAL_CAPSULE | Freq: Two times a day (BID) | ORAL | Status: AC
Start: 2014-04-15 — End: 2014-04-20

## 2014-04-15 NOTE — Telephone Encounter (Signed)
Urine cutlure + for e.coli. Will tx with macrobid 100mg  BID x5d  Message called to patient meds to pharmacy

## 2014-05-18 ENCOUNTER — Emergency Department (INDEPENDENT_AMBULATORY_CARE_PROVIDER_SITE_OTHER)
Admission: EM | Admit: 2014-05-18 | Discharge: 2014-05-18 | Disposition: A | Discharge status: Home / Self Care | Payer: Medicaid Other | Source: Home / Self Care

## 2014-05-18 DIAGNOSIS — N949 Unspecified condition associated with female genital organs and menstrual cycle: Secondary | ICD-10-CM

## 2014-05-18 DIAGNOSIS — K2970 Gastritis, unspecified, without bleeding: Secondary | ICD-10-CM

## 2014-05-18 DIAGNOSIS — R110 Nausea: Secondary | ICD-10-CM

## 2014-05-18 DIAGNOSIS — K2990 Gastroduodenitis, unspecified, without bleeding: Secondary | ICD-10-CM

## 2014-05-18 LAB — POCT PREGNANCY, URINE: PREG TEST UR: NEGATIVE

## 2014-05-18 LAB — POCT URINALYSIS DIP (DEVICE)
Bilirubin Urine: NEGATIVE
Glucose, UA: NEGATIVE mg/dL
KETONES UR: NEGATIVE mg/dL
LEUKOCYTES UA: NEGATIVE
NITRITE: NEGATIVE
PH: 7 (ref 5.0–8.0)
PROTEIN: NEGATIVE mg/dL
Specific Gravity, Urine: 1.02 (ref 1.005–1.030)
UROBILINOGEN UA: 0.2 mg/dL (ref 0.0–1.0)

## 2014-05-18 MED ORDER — RANITIDINE HCL 150 MG PO CAPS: 150.0000 mg | ORAL_CAPSULE | Freq: Two times a day (BID) | ORAL | Status: DC

## 2014-05-18 MED ORDER — ONDANSETRON HCL 4 MG PO TABS: 4.0000 mg | ORAL_TABLET | Freq: Four times a day (QID) | ORAL | Status: DC

## 2014-05-18 MED ORDER — PROMETHAZINE HCL 25 MG PO TABS: 25.0000 mg | ORAL_TABLET | Freq: Four times a day (QID) | ORAL | Status: DC | PRN

## 2014-05-18 NOTE — ED Provider Notes (Signed)
Medical screening examination/treatment/procedure(s) were performed by resident physician or non-physician practitioner and as supervising physician I was immediately available for consultation/collaboration.   Barkley BrunsKINDL,JAMES DOUGLAS MD.   Linna HoffJames D Kindl, MD 05/18/14 [phone]

## 2014-05-18 NOTE — Discharge Instructions (Signed)
Nausea, Adult Nausea is the feeling that you have an upset stomach or have to vomit. Nausea by itself is not likely a serious concern, but it may be an early sign of more serious medical problems. As nausea gets worse, it can lead to vomiting. If vomiting develops, there is the risk of dehydration.  CAUSES   Viral infections.  Food poisoning.  Medicines.  Pregnancy.  Motion sickness.  Migraine headaches.  Emotional distress.  Severe pain from any source.  Alcohol intoxication. HOME CARE INSTRUCTIONS  Get plenty of rest.  Ask your caregiver about specific rehydration instructions.  Eat small amounts of food and sip liquids more often.  Take all medicines as told by your caregiver. SEEK MEDICAL CARE IF:  You have not improved after 2 days, or you get worse.  You have a headache. SEEK IMMEDIATE MEDICAL CARE IF:   You have a fever.  You faint.  You keep vomiting or have blood in your vomit.  You are extremely weak or dehydrated.  You have dark or bloody stools.  You have severe chest or abdominal pain. MAKE SURE YOU:  Understand these instructions.  Will watch your condition.  Will get help right away if you are not doing well or get worse. Document Released: 11/22/2004 Document Revised: 07/09/2012 Document Reviewed: 06/27/2011 [facility] Patient Information 2015 [place], [place]. This information is not intended to replace advice given to you by your health care provider. Make sure you discuss any questions you have with your health care provider.  Gastritis, Adult Gastritis is soreness and puffiness (inflammation) of the lining of the stomach. If you do not get help, gastritis can cause bleeding and sores (ulcers) in the stomach. HOME CARE   Only take medicine as told by your doctor.  If you were given antibiotic medicines, take them as told. Finish the medicines even if you start to feel better.  Drink enough fluids to keep your pee (urine) clear or pale  yellow.  Avoid foods and drinks that make your problems worse. Foods you may want to avoid include:  Caffeine or alcohol.  Chocolate.  Mint.  Garlic and onions.  Spicy foods.  Citrus fruits, including oranges, lemons, or limes.  Food containing tomatoes, including sauce, chili, salsa, and pizza.  Fried and fatty foods.  Eat small meals throughout the day instead of large meals. GET HELP RIGHT AWAY IF:   You have black or dark red poop (stools).  You throw up (vomit) blood. It may look like coffee grounds.  You cannot keep fluids down.  Your belly (abdominal) pain gets worse.  You have a fever.  You do not feel better after 1 week.  You have any other questions or concerns. MAKE SURE YOU:   Understand these instructions.  Will watch your condition.  Will get help right away if you are not doing well or get worse. Document Released: 04/02/2008 Document Revised: 01/07/2012 Document Reviewed: 11/28/2011 [facility] Patient Information 2015 [place], [place]. This information is not intended to replace advice given to you by your health care provider. Make sure you discuss any questions you have with your health care provider.

## 2014-05-18 NOTE — ED Provider Notes (Signed)
CSN: [id number]     Arrival date & time 05/18/14  1535 History   First MD Initiated Contact with Patient 05/18/14 1630     Chief Complaint  Patient presents with  . Nausea   (Consider location/radiation/quality/duration/timing/severity/associated sxs/prior Treatment) HPI Comments: 28 y o f with nausea for 4 d. Occurs in the AM and at night after eating. Vomited once today. Denies abdominal pain. + urinary frequency, no dysuria. Menses started today.   Past Medical History  Diagnosis Date  . No pertinent past medical history   . Abnormal Pap smear   . Anemia   . Anxiety     worse when on meds   Past Surgical History  Procedure Laterality Date  . Cesarean section     Family History  Problem Relation Age of Onset  . Alcohol abuse Neg Hx   . Arthritis Neg Hx   . Asthma Neg Hx   . Birth defects Neg Hx   . Cancer Neg Hx   . COPD Neg Hx   . Depression Neg Hx   . Diabetes Neg Hx   . Drug abuse Neg Hx   . Early death Neg Hx   . Hearing loss Neg Hx   . Heart disease Neg Hx   . Hyperlipidemia Neg Hx   . Hypertension Neg Hx   . Kidney disease Neg Hx   . Learning disabilities Neg Hx   . Mental illness Neg Hx   . Mental retardation Neg Hx   . Miscarriages / Stillbirths Neg Hx   . Stroke Neg Hx   . Vision loss Neg Hx    History  Substance Use Topics  . Smoking status: Never Smoker   . Smokeless tobacco: Never Used  . Alcohol Use: Yes     Comment: occ   OB History   Grav Para Term Preterm Abortions TAB SAB Ect Mult Living   1 1 1       1      Review of Systems  Constitutional: Negative for fever and activity change.  Respiratory: Negative for cough, chest tightness and shortness of breath.   Cardiovascular: Negative for chest pain and leg swelling.  Gastrointestinal: Positive for nausea and vomiting. Negative for abdominal pain, diarrhea, constipation and abdominal distention.       Dyspepsia. Denies reflux or acid indigestion.  Genitourinary: Positive for frequency.  Negative for dysuria, urgency, flank pain, vaginal bleeding, vaginal discharge and pelvic pain.    Allergies  Review of patient's allergies indicates no known allergies.  Home Medications   Prior to Admission medications   Medication Sig Start Date End Date Taking? Authorizing Provider  cetirizine (ZYRTEC) 10 MG tablet Take 10 mg by mouth daily as needed for allergies.    Historical Provider, MD  ibuprofen (ADVIL,MOTRIN) 200 MG tablet Take 600 mg by mouth every 6 (six) hours as needed for moderate pain.    Historical Provider, MD  IRON PO Take 1 tablet by mouth daily.    Historical Provider, MD  ondansetron (ZOFRAN) 4 MG tablet Take 1 tablet (4 mg total) by mouth every 6 (six) hours. 05/18/14   Hayden Rasmussen, NP  promethazine (PHENERGAN) 25 MG tablet Take 1 tablet (25 mg total) by mouth every 6 (six) hours as needed for nausea or vomiting. 03/14/14   Layla Maw Ward, DO  ranitidine (ZANTAC) 150 MG capsule Take 1 capsule (150 mg total) by mouth 2 (two) times daily. 05/18/14   Hayden Rasmussen, NP   BP 118/64  Pulse 63  Temp(Src) 98.4 F (36.9 C) (Oral)  Resp 12  SpO2 100%  LMP 05/18/2014 Physical Exam  Nursing note and vitals reviewed. Constitutional: She is oriented to person, place, and time. She appears well-developed and well-nourished. No distress.  Neck: Normal range of motion. Neck supple.  Cardiovascular: Normal rate, regular rhythm and normal heart sounds.   Pulmonary/Chest: Effort normal and breath sounds normal.  Abdominal: Soft. Bowel sounds are normal. She exhibits no distension and no mass. There is no rebound and no guarding.  Minor, equivocal tenderness 6 cm left lateral to the umbilicus. No tenderness over the pelvis.  Neurological: She is alert and oriented to person, place, and time. No cranial nerve deficit.  Skin: Skin is warm and dry.  Psychiatric: She has a normal mood and affect.    ED Course  Procedures (including critical care time) Labs Review Labs Reviewed   POCT URINALYSIS DIP (DEVICE) - Abnormal; Notable for the following:    Hgb urine dipstick LARGE (*)    All other components within normal limits  POCT PREGNANCY, URINE   Results for orders placed during the hospital encounter of 05/18/14  POCT URINALYSIS DIP (DEVICE)      Result Value Ref Range   Glucose, UA NEGATIVE  NEGATIVE mg/dL   Bilirubin Urine NEGATIVE  NEGATIVE   Ketones, ur NEGATIVE  NEGATIVE mg/dL   Specific Gravity, Urine 1.020  1.005 - 1.030   Hgb urine dipstick LARGE (*) NEGATIVE   pH 7.0  5.0 - 8.0   Protein, ur NEGATIVE  NEGATIVE mg/dL   Urobilinogen, UA 0.2  0.0 - 1.0 mg/dL   Nitrite NEGATIVE  NEGATIVE   Leukocytes, UA NEGATIVE  NEGATIVE  POCT PREGNANCY, URINE      Result Value Ref Range   Preg Test, Ur NEGATIVE  NEGATIVE    Imaging Review No results found.   MDM   1. Nausea in adult   2. Gastritis     Zantac 150 bid Phenergan tabs #15      "zofran does not work". Iron and ibuprofen can make this worse.    Hayden Rasmussenavid Tejon Gracie, NP 05/18/14 [phone]

## 2014-06-10 ENCOUNTER — Emergency Department (HOSPITAL_COMMUNITY)
Admission: EM | Admit: 2014-06-10 | Discharge: 2014-06-10 | Disposition: A | Discharge status: Home / Self Care | Payer: Medicaid Other | Attending: Emergency Medicine | Admitting: Emergency Medicine

## 2014-06-10 ENCOUNTER — Encounter (HOSPITAL_COMMUNITY): Payer: Self-pay | Admitting: Emergency Medicine

## 2014-06-10 DIAGNOSIS — Z79899 Other long term (current) drug therapy: Secondary | ICD-10-CM | POA: Diagnosis not present

## 2014-06-10 DIAGNOSIS — Z8659 Personal history of other mental and behavioral disorders: Secondary | ICD-10-CM | POA: Insufficient documentation

## 2014-06-10 DIAGNOSIS — R112 Nausea with vomiting, unspecified: Secondary | ICD-10-CM | POA: Insufficient documentation

## 2014-06-10 DIAGNOSIS — Z3202 Encounter for pregnancy test, result negative: Secondary | ICD-10-CM | POA: Insufficient documentation

## 2014-06-10 DIAGNOSIS — D649 Anemia, unspecified: Secondary | ICD-10-CM | POA: Insufficient documentation

## 2014-06-10 DIAGNOSIS — R197 Diarrhea, unspecified: Secondary | ICD-10-CM | POA: Diagnosis not present

## 2014-06-10 LAB — URINALYSIS, ROUTINE W REFLEX MICROSCOPIC
Bilirubin Urine: NEGATIVE
Glucose, UA: NEGATIVE mg/dL
Hgb urine dipstick: NEGATIVE
Ketones, ur: NEGATIVE mg/dL
Leukocytes, UA: NEGATIVE
Nitrite: NEGATIVE
Protein, ur: NEGATIVE mg/dL
SPECIFIC GRAVITY, URINE: 1.016 (ref 1.005–1.030)
UROBILINOGEN UA: 0.2 mg/dL (ref 0.0–1.0)
pH: 7.5 (ref 5.0–8.0)

## 2014-06-10 LAB — CBC WITH DIFFERENTIAL/PLATELET
BASOS ABS: 0 10*3/uL (ref 0.0–0.1)
BASOS PCT: 0 % (ref 0–1)
EOS ABS: 0.1 10*3/uL (ref 0.0–0.7)
Eosinophils Relative: 3 % (ref 0–5)
HCT: 32.2 % — ABNORMAL LOW (ref 36.0–46.0)
HEMOGLOBIN: 11.4 g/dL — AB (ref 12.0–15.0)
Lymphocytes Relative: 39 % (ref 12–46)
Lymphs Abs: 1.7 10*3/uL (ref 0.7–4.0)
MCH: 29.8 pg (ref 26.0–34.0)
MCHC: 35.4 g/dL (ref 30.0–36.0)
MCV: 84.1 fL (ref 78.0–100.0)
Monocytes Absolute: 0.3 10*3/uL (ref 0.1–1.0)
Monocytes Relative: 6 % (ref 3–12)
NEUTROS PCT: 52 % (ref 43–77)
Neutro Abs: 2.3 10*3/uL (ref 1.7–7.7)
Platelets: 295 10*3/uL (ref 150–400)
RBC: 3.83 MIL/uL — ABNORMAL LOW (ref 3.87–5.11)
RDW: 13.1 % (ref 11.5–15.5)
WBC: 4.5 10*3/uL (ref 4.0–10.5)

## 2014-06-10 LAB — COMPREHENSIVE METABOLIC PANEL
ALBUMIN: 3.9 g/dL (ref 3.5–5.2)
ALK PHOS: 31 U/L — AB (ref 39–117)
ALT: 20 U/L (ref 0–35)
ANION GAP: 10 (ref 5–15)
AST: 22 U/L (ref 0–37)
BUN: 10 mg/dL (ref 6–23)
CO2: 28 mEq/L (ref 19–32)
Calcium: 9.7 mg/dL (ref 8.4–10.5)
Chloride: 101 mEq/L (ref 96–112)
Creatinine, Ser: 0.84 mg/dL (ref 0.50–1.10)
GFR calc Af Amer: >90 mL/min (ref >90)
GFR calc non Af Amer: >90 mL/min (ref >90)
Glucose, Bld: 76 mg/dL (ref 70–99)
POTASSIUM: 4.4 meq/L (ref 3.7–5.3)
Sodium: 139 mEq/L (ref 137–147)
Total Bilirubin: 0.3 mg/dL (ref 0.3–1.2)
Total Protein: 7.6 g/dL (ref 6.0–8.3)

## 2014-06-10 LAB — LIPASE, BLOOD: LIPASE: 28 U/L (ref 11–59)

## 2014-06-10 LAB — POC URINE PREG, ED: PREG TEST UR: NEGATIVE

## 2014-06-10 MED ORDER — PROMETHAZINE HCL 25 MG PO TABS: 25.0000 mg | ORAL_TABLET | Freq: Four times a day (QID) | ORAL | Status: DC | PRN

## 2014-06-10 NOTE — ED Notes (Signed)
Pt c/o nausea w/ 3 episodes of vomiting.  No pain.  Pt in NAD.

## 2014-06-10 NOTE — ED Provider Notes (Signed)
CSN: [id number]     Arrival date & time 06/10/14  1008 History   First MD Initiated Contact with Patient 06/10/14 1013     Chief Complaint  Patient presents with  . Nausea  . Emesis     (Consider location/radiation/quality/duration/timing/severity/associated sxs/prior Treatment) HPI Kellie Davila is a 28 y.o. female who presents to emergency department complaining of nausea and vomiting since last night. Patient states that her symptoms began yesterday evening, started with vomiting. She states she had 3 episodes of vomiting overnight, and 2 lose bowel movements. She denies abdominal pain. She denies any fever, chills. She denies any blood in her stool or emesis. She denies any possibility of being pregnant. She denies any urinary symptoms. No abnormal vaginal discharge. Denies possibility of STI. Patient did not try taking any for her symptoms. She reports history of the same, states the Zyrtec does not work, states usually Phenergan LMP now works.   Past Medical History  Diagnosis Date  . No pertinent past medical history   . Abnormal Pap smear   . Anemia   . Anxiety     worse when on meds   Past Surgical History  Procedure Laterality Date  . Cesarean section     Family History  Problem Relation Age of Onset  . Alcohol abuse Neg Hx   . Arthritis Neg Hx   . Asthma Neg Hx   . Birth defects Neg Hx   . Cancer Neg Hx   . COPD Neg Hx   . Depression Neg Hx   . Diabetes Neg Hx   . Drug abuse Neg Hx   . Early death Neg Hx   . Hearing loss Neg Hx   . Heart disease Neg Hx   . Hyperlipidemia Neg Hx   . Hypertension Neg Hx   . Kidney disease Neg Hx   . Learning disabilities Neg Hx   . Mental illness Neg Hx   . Mental retardation Neg Hx   . Miscarriages / Stillbirths Neg Hx   . Stroke Neg Hx   . Vision loss Neg Hx    History  Substance Use Topics  . Smoking status: Never Smoker   . Smokeless tobacco: Never Used  . Alcohol Use: Yes     Comment: occ   OB History   Grav  Para Term Preterm Abortions TAB SAB Ect Mult Living   1 1 1       1      Review of Systems  Constitutional: Negative for fever and chills.  Respiratory: Negative for cough, chest tightness and shortness of breath.   Cardiovascular: Negative for chest pain, palpitations and leg swelling.  Gastrointestinal: Positive for nausea, vomiting and diarrhea. Negative for abdominal pain, constipation and blood in stool.  Genitourinary: Negative for dysuria, flank pain and pelvic pain.  Musculoskeletal: Negative for arthralgias and neck stiffness.  Skin: Negative for rash.  Neurological: Negative for dizziness, weakness and headaches.  All other systems reviewed and are negative.     Allergies  Review of patient's allergies indicates no known allergies.  Home Medications   Prior to Admission medications   Medication Sig Start Date End Date Taking? Authorizing Provider  cetirizine (ZYRTEC) 10 MG tablet Take 10 mg by mouth daily as needed for allergies.   Yes Historical Provider, MD  Ferrous Sulfate (IRON) 142 (45 FE) MG TBCR Take 1 tablet by mouth daily.   Yes Historical Provider, MD  ibuprofen (ADVIL,MOTRIN) 200 MG tablet Take 600 mg by  mouth every 6 (six) hours as needed for moderate pain.   Yes Historical Provider, MD  promethazine (PHENERGAN) 25 MG tablet Take 1 tablet (25 mg total) by mouth every 6 (six) hours as needed for nausea or vomiting. 03/14/14  Yes Kristen N Ward, DO   BP 99/64  Pulse 78  Temp(Src) 98.2 F (36.8 C) (Oral)  Resp 18  SpO2 100%  LMP 05/18/2014 Physical Exam  Nursing note and vitals reviewed. Constitutional: She appears well-developed and well-nourished. No distress.  HENT:  Head: Normocephalic.  Eyes: Conjunctivae are normal.  Neck: Neck supple.  Cardiovascular: Normal rate, regular rhythm and normal heart sounds.   Pulmonary/Chest: Effort normal and breath sounds normal. No respiratory distress. She has no wheezes. She has no rales.  Abdominal: Soft. Bowel  sounds are normal. She exhibits no distension. There is no tenderness. There is no rebound and no guarding.  No CVA tenderness bilateraly  Musculoskeletal: She exhibits no edema.  Neurological: She is alert.  Skin: Skin is warm and dry.  Psychiatric: She has a normal mood and affect. Her behavior is normal.    ED Course  Procedures (including critical care time) Labs Review Labs Reviewed  CBC WITH DIFFERENTIAL - Abnormal; Notable for the following:    RBC 3.83 (*)    Hemoglobin 11.4 (*)    HCT 32.2 (*)    All other components within normal limits  COMPREHENSIVE METABOLIC PANEL - Abnormal; Notable for the following:    Alkaline Phosphatase 31 (*)    All other components within normal limits  URINALYSIS, ROUTINE W REFLEX MICROSCOPIC - Abnormal; Notable for the following:    APPearance CLOUDY (*)    All other components within normal limits  LIPASE, BLOOD  POC URINE PREG, ED    Imaging Review No results found.   EKG Interpretation None      MDM   Final diagnoses:  Non-intractable vomiting with nausea, vomiting of unspecified type    Patient with nausea, vomiting, loose bowel movements since yesterday. She states she is able to hold down fluids. Offered IV bolus, zofran, which pt refused. States "phenergan only works." Pt drove here, will hold off on medications here in ED, she is agreeable to that. Will check labs and ua.    Pt's labs unremarkable. Urine preg negative. UA unremarkable. Home with phenergan. Pt refused tx in ED. Suspect most likely viral gastroenteritis. Stable for d/c  Filed Vitals:   06/10/14 1013 06/10/14 1156  BP: 99/64 91/60  Pulse: 78   Temp: 98.2 F (36.8 C)   TempSrc: Oral   Resp: 18 18  SpO2: 100% 100%     Dael Howland A John Vasconcelos, PA-C 06/10/14 1255

## 2014-06-10 NOTE — ED Notes (Signed)
MD at bedside. 

## 2014-06-10 NOTE — Discharge Instructions (Signed)
Phenergan for nausea. Follow up with your primary care doctor for recheck as needed. Make sure to drink plenty of fluids to prevent dehydration.    Nausea and Vomiting Nausea is a sick feeling that often comes before throwing up (vomiting). Vomiting is a reflex where stomach contents come out of your mouth. Vomiting can cause severe loss of body fluids (dehydration). Children and elderly adults can become dehydrated quickly, especially if they also have diarrhea. Nausea and vomiting are symptoms of a condition or disease. It is important to find the cause of your symptoms. CAUSES   Direct irritation of the stomach lining. This irritation can result from increased acid production (gastroesophageal reflux disease), infection, food poisoning, taking certain medicines (such as nonsteroidal anti-inflammatory drugs), alcohol use, or tobacco use.  Signals from the brain.These signals could be caused by a headache, heat exposure, an inner ear disturbance, increased pressure in the brain from injury, infection, a tumor, or a concussion, pain, emotional stimulus, or metabolic problems.  An obstruction in the gastrointestinal tract (bowel obstruction).  Illnesses such as diabetes, hepatitis, gallbladder problems, appendicitis, kidney problems, cancer, sepsis, atypical symptoms of a heart attack, or eating disorders.  Medical treatments such as chemotherapy and radiation.  Receiving medicine that makes you sleep (general anesthetic) during surgery. DIAGNOSIS Your caregiver may ask for tests to be done if the problems do not improve after a few days. Tests may also be done if symptoms are severe or if the reason for the nausea and vomiting is not clear. Tests may include:  Urine tests.  Blood tests.  Stool tests.  Cultures (to look for evidence of infection).  X-rays or other imaging studies. Test results can help your caregiver make decisions about treatment or the need for additional  tests. TREATMENT You need to stay well hydrated. Drink frequently but in small amounts.You may wish to drink water, sports drinks, clear broth, or eat frozen ice pops or gelatin dessert to help stay hydrated.When you eat, eating slowly may help prevent nausea.There are also some antinausea medicines that may help prevent nausea. HOME CARE INSTRUCTIONS   Take all medicine as directed by your caregiver.  If you do not have an appetite, do not force yourself to eat. However, you must continue to drink fluids.  If you have an appetite, eat a normal diet unless your caregiver tells you differently.  Eat a variety of complex carbohydrates (rice, wheat, potatoes, bread), lean meats, yogurt, fruits, and vegetables.  Avoid high-fat foods because they are more difficult to digest.  Drink enough water and fluids to keep your urine clear or pale yellow.  If you are dehydrated, ask your caregiver for specific rehydration instructions. Signs of dehydration may include:  Severe thirst.  Dry lips and mouth.  Dizziness.  Dark urine.  Decreasing urine frequency and amount.  Confusion.  Rapid breathing or pulse. SEEK IMMEDIATE MEDICAL CARE IF:   You have blood or [place] flecks (like coffee grounds) in your vomit.  You have black or bloody stools.  You have a severe headache or stiff neck.  You are confused.  You have severe abdominal pain.  You have chest pain or trouble breathing.  You do not urinate at least once every 8 hours.  You develop cold or clammy skin.  You continue to vomit for longer than 24 to 48 hours.  You have a fever. MAKE SURE YOU:   Understand these instructions.  Will watch your condition.  Will get help right away if you  are not doing well or get worse. Document Released: 10/15/2005 Document Revised: 01/07/2012 Document Reviewed: 03/14/2011 [facility] Patient Information 2015 [place], [place]. This information is not intended to replace advice given to  you by your health care provider. Make sure you discuss any questions you have with your health care provider.

## 2014-06-13 NOTE — ED Provider Notes (Signed)
Medical screening examination/treatment/procedure(s) were performed by non-physician practitioner and as supervising physician I was immediately available for consultation/collaboration.   EKG Interpretation None       Raeford RazorStephen Zaidy Absher, MD 06/13/14 1414

## 2014-06-27 ENCOUNTER — Encounter (HOSPITAL_COMMUNITY): Payer: Self-pay | Admitting: Emergency Medicine

## 2014-06-27 ENCOUNTER — Emergency Department (HOSPITAL_COMMUNITY)
Admission: EM | Admit: 2014-06-27 | Discharge: 2014-06-27 | Discharge status: Against Medical Advice | Payer: Medicaid Other | Attending: Emergency Medicine | Admitting: Emergency Medicine

## 2014-06-27 DIAGNOSIS — J4489 Other specified chronic obstructive pulmonary disease: Secondary | ICD-10-CM | POA: Insufficient documentation

## 2014-06-27 DIAGNOSIS — R112 Nausea with vomiting, unspecified: Secondary | ICD-10-CM | POA: Insufficient documentation

## 2014-06-27 DIAGNOSIS — H9209 Otalgia, unspecified ear: Secondary | ICD-10-CM | POA: Insufficient documentation

## 2014-06-27 DIAGNOSIS — J449 Chronic obstructive pulmonary disease, unspecified: Secondary | ICD-10-CM | POA: Diagnosis not present

## 2014-06-27 LAB — CBC WITH DIFFERENTIAL/PLATELET
BASOS ABS: 0 10*3/uL (ref 0.0–0.1)
Basophils Relative: 0 % (ref 0–1)
Eosinophils Absolute: 0.1 10*3/uL (ref 0.0–0.7)
Eosinophils Relative: 1 % (ref 0–5)
HCT: 32.5 % — ABNORMAL LOW (ref 36.0–46.0)
Hemoglobin: 11.2 g/dL — ABNORMAL LOW (ref 12.0–15.0)
Lymphocytes Relative: 34 % (ref 12–46)
Lymphs Abs: 2.1 10*3/uL (ref 0.7–4.0)
MCH: 29.2 pg (ref 26.0–34.0)
MCHC: 34.5 g/dL (ref 30.0–36.0)
MCV: 84.9 fL (ref 78.0–100.0)
Monocytes Absolute: 0.4 10*3/uL (ref 0.1–1.0)
Monocytes Relative: 6 % (ref 3–12)
NEUTROS ABS: 3.6 10*3/uL (ref 1.7–7.7)
NEUTROS PCT: 59 % (ref 43–77)
Platelets: 308 10*3/uL (ref 150–400)
RBC: 3.83 MIL/uL — ABNORMAL LOW (ref 3.87–5.11)
RDW: 13.2 % (ref 11.5–15.5)
WBC: 6.2 10*3/uL (ref 4.0–10.5)

## 2014-06-27 LAB — COMPREHENSIVE METABOLIC PANEL
ALT: 25 U/L (ref 0–35)
AST: 30 U/L (ref 0–37)
Albumin: 4.2 g/dL (ref 3.5–5.2)
Alkaline Phosphatase: 38 U/L — ABNORMAL LOW (ref 39–117)
Anion gap: 12 (ref 5–15)
BUN: 10 mg/dL (ref 6–23)
CALCIUM: 9.7 mg/dL (ref 8.4–10.5)
CO2: 25 meq/L (ref 19–32)
Chloride: 102 mEq/L (ref 96–112)
Creatinine, Ser: 0.76 mg/dL (ref 0.50–1.10)
GFR calc Af Amer: >90 mL/min (ref >90)
GFR calc non Af Amer: >90 mL/min (ref >90)
Glucose, Bld: 88 mg/dL (ref 70–99)
Potassium: 4.1 mEq/L (ref 3.7–5.3)
SODIUM: 139 meq/L (ref 137–147)
Total Bilirubin: 0.4 mg/dL (ref 0.3–1.2)
Total Protein: 7.9 g/dL (ref 6.0–8.3)

## 2014-06-27 LAB — LIPASE, BLOOD: Lipase: 25 U/L (ref 11–59)

## 2014-06-27 NOTE — ED Notes (Signed)
Called pt for reassessment. No one in waiting room present

## 2014-06-27 NOTE — ED Notes (Signed)
Pt c/o n/v, earache, sore throat, headache, and cough x 3 days, denies abd pain.

## 2014-07-20 ENCOUNTER — Emergency Department (INDEPENDENT_AMBULATORY_CARE_PROVIDER_SITE_OTHER)
Admission: EM | Admit: 2014-07-20 | Discharge: 2014-07-20 | Disposition: A | Discharge status: Home / Self Care | Payer: Medicaid Other | Source: Home / Self Care | Attending: Family Medicine | Admitting: Family Medicine

## 2014-07-20 ENCOUNTER — Encounter (HOSPITAL_COMMUNITY): Payer: Self-pay | Admitting: Emergency Medicine

## 2014-07-20 DIAGNOSIS — A084 Viral intestinal infection, unspecified: Secondary | ICD-10-CM

## 2014-07-20 DIAGNOSIS — A088 Other specified intestinal infections: Secondary | ICD-10-CM

## 2014-07-20 MED ORDER — ONDANSETRON 4 MG PO TBDP
8.0000 mg | ORAL_TABLET | Freq: Once | ORAL | Status: AC
  Administered 2014-07-20: 8 mg via ORAL

## 2014-07-20 MED ORDER — PROMETHAZINE HCL 25 MG PO TABS: 25.0000 mg | ORAL_TABLET | Freq: Four times a day (QID) | ORAL | Status: DC | PRN

## 2014-07-20 MED ORDER — ONDANSETRON 4 MG PO TBDP
ORAL_TABLET | ORAL | Status: AC
  Filled 2014-07-20: qty 2

## 2014-07-20 NOTE — ED Provider Notes (Signed)
Kellie Davila is a 28 y.o. female who presents to Urgent Care today for vomiting and diarrhea. Patient is a one-day history of vomiting and diarrhea. Multiple teeth foot work are sick with a similar illness. She continues to urinate but is having trouble keeping fluids and solids down. No fevers or chills chest pains or palpitations.   Past Medical History  Diagnosis Date  . No pertinent past medical history   . Abnormal Pap smear   . Anemia   . Anxiety     worse when on meds   History  Substance Use Topics  . Smoking status: Never Smoker   . Smokeless tobacco: Never Used  . Alcohol Use: Yes     Comment: occ   ROS as above Medications: No current facility-administered medications for this encounter.   Current Outpatient Prescriptions  Medication Sig Dispense Refill  . cetirizine (ZYRTEC) 10 MG tablet Take 10 mg by mouth daily as needed for allergies.      . Ferrous Sulfate (IRON) 142 (45 FE) MG TBCR Take 1 tablet by mouth daily.      Marland Kitchen ibuprofen (ADVIL,MOTRIN) 200 MG tablet Take 600 mg by mouth every 6 (six) hours as needed for moderate pain.      . promethazine (PHENERGAN) 25 MG tablet Take 1 tablet (25 mg total) by mouth every 6 (six) hours as needed for nausea or vomiting.  20 tablet  0    Exam:  BP 104/55  Pulse 84  Temp(Src) 98.2 F (36.8 C) (Oral)  Resp 18  SpO2 98%  LMP 07/13/2014 Gen: Well NAD HEENT: EOMI,  MMM Lungs: Normal work of breathing. CTABL Heart: RRR no MRG Abd: NABS, Soft. Nondistended, Nontender no rebound or guarding Exts: Brisk capillary refill, warm and well perfused.   Patient was given 8 mg of oral Zofran, and felt better  No results found for this or any previous visit (from the past 24 hour(s)). No results found.  Assessment and Plan: 28 y.o. female with viral gastroenteritis. Plan to treat with Phenergan and watchful waiting. Followup with PCP as needed  Discussed warning signs or symptoms. Please see discharge instructions. Patient  expresses understanding.     Rodolph Bong, MD 07/20/14 1310

## 2014-07-20 NOTE — ED Notes (Signed)
Patient c/o sx including nausea, diarrhea, and headache x 2 days. Patient denies fever or chills. 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 Patient is alert and oriented and in NAD.

## 2014-07-20 NOTE — Discharge Instructions (Signed)
Thank you for coming in today. If your belly pain worsens, or you have high fever, bad vomiting, blood in your stool or black tarry stool go to the Emergency Room.   Take Phenergan as needed  Viral Gastroenteritis Viral gastroenteritis is also known as stomach flu. This condition affects the stomach and intestinal tract. It can cause sudden diarrhea and vomiting. The illness typically lasts 3 to 8 days. Most people develop an immune response that eventually gets rid of the virus. While this natural response develops, the virus can make you quite ill. CAUSES  Many different viruses can cause gastroenteritis, such as rotavirus or noroviruses. You can catch one of these viruses by consuming contaminated food or water. You may also catch a virus by sharing utensils or other personal items with an infected person or by touching a contaminated surface. SYMPTOMS  The most common symptoms are diarrhea and vomiting. These problems can cause a severe loss of body fluids (dehydration) and a body salt (electrolyte) imbalance. Other symptoms may include:  Fever.  Headache.  Fatigue.  Abdominal pain. DIAGNOSIS  Your caregiver can usually diagnose viral gastroenteritis based on your symptoms and a physical exam. A stool sample may also be taken to test for the presence of viruses or other infections. TREATMENT  This illness typically goes away on its own. Treatments are aimed at rehydration. The most serious cases of viral gastroenteritis involve vomiting so severely that you are not able to keep fluids down. In these cases, fluids must be given through an intravenous line (IV). HOME CARE INSTRUCTIONS   Drink enough fluids to keep your urine clear or pale yellow. Drink small amounts of fluids frequently and increase the amounts as tolerated.  Ask your caregiver for specific rehydration instructions.  Avoid:  Foods high in sugar.  Alcohol.  Carbonated drinks.  Tobacco.  Juice.  Caffeine  drinks.  Extremely hot or cold fluids.  Fatty, greasy foods.  Too much intake of anything at one time.  Dairy products until 24 to 48 hours after diarrhea stops.  You may consume probiotics. Probiotics are active cultures of beneficial bacteria. They may lessen the amount and number of diarrheal stools in adults. Probiotics can be found in yogurt with active cultures and in supplements.  Wash your hands well to avoid spreading the virus.  Only take over-the-counter or prescription medicines for pain, discomfort, or fever as directed by your caregiver. Do not give aspirin to children. Antidiarrheal medicines are not recommended.  Ask your caregiver if you should continue to take your regular prescribed and over-the-counter medicines.  Keep all follow-up appointments as directed by your caregiver. SEEK IMMEDIATE MEDICAL CARE IF:   You are unable to keep fluids down.  You do not urinate at least once every 6 to 8 hours.  You develop shortness of breath.  You notice blood in your stool or vomit. This may look like coffee grounds.  You have abdominal pain that increases or is concentrated in one small area (localized).  You have persistent vomiting or diarrhea.  You have a fever.  The patient is a child younger than 3 months, and he or she has a fever.  The patient is a child older than 3 months, and he or she has a fever and persistent symptoms.  The patient is a child older than 3 months, and he or she has a fever and symptoms suddenly get worse.  The patient is a baby, and he or she has no tears when  crying. MAKE SURE YOU:   Understand these instructions.  Will watch your condition.  Will get help right away if you are not doing well or get worse. Document Released: 10/15/2005 Document Revised: 01/07/2012 Document Reviewed: 08/01/2011 [facility] Patient Information 2015 [place], [place]. This information is not intended to replace advice given to you by your health care  provider. Make sure you discuss any questions you have with your health care provider.

## 2014-08-18 ENCOUNTER — Emergency Department (INDEPENDENT_AMBULATORY_CARE_PROVIDER_SITE_OTHER)
Admission: EM | Admit: 2014-08-18 | Discharge: 2014-08-18 | Disposition: A | Discharge status: Home / Self Care | Payer: Medicaid Other | Source: Home / Self Care | Attending: Emergency Medicine | Admitting: Emergency Medicine

## 2014-08-18 ENCOUNTER — Encounter (HOSPITAL_COMMUNITY): Payer: Self-pay | Admitting: Emergency Medicine

## 2014-08-18 DIAGNOSIS — J0100 Acute maxillary sinusitis, unspecified: Secondary | ICD-10-CM | POA: Diagnosis not present

## 2014-08-18 MED ORDER — AMOXICILLIN-POT CLAVULANATE 875-125 MG PO TABS: 1.0000 | ORAL_TABLET | Freq: Two times a day (BID) | ORAL | Status: DC

## 2014-08-18 MED ORDER — CETIRIZINE HCL 10 MG PO TABS: 10.0000 mg | ORAL_TABLET | Freq: Every day | ORAL | Status: DC | PRN

## 2014-08-18 MED ORDER — IPRATROPIUM BROMIDE 0.06 % NA SOLN: 2.0000 | Freq: Four times a day (QID) | NASAL | Status: DC

## 2014-08-18 MED ORDER — PROMETHAZINE HCL 25 MG PO TABS: 25.0000 mg | ORAL_TABLET | Freq: Four times a day (QID) | ORAL | Status: DC | PRN

## 2014-08-18 NOTE — ED Provider Notes (Signed)
CSN: [id number]     Arrival date & time 08/18/14  1031 History   First MD Initiated Contact with Patient 08/18/14 1050     Chief Complaint  Patient presents with  . Sinusitis   (Consider location/radiation/quality/duration/timing/severity/associated sxs/prior Treatment) HPI She's a 28 year old woman here for evaluation of sinusitis. Her symptoms started about one week ago with sinus pressure, nasal congestion, postnasal drip, headaches. She denies any fevers or chills. She does have nausea. No vomiting or diarrhea. She denies any ear pain or drainage. No cough. Her shortness of breath.  Past Medical History  Diagnosis Date  . No pertinent past medical history   . Abnormal Pap smear   . Anemia   . Anxiety     worse when on meds   Past Surgical History  Procedure Laterality Date  . Cesarean section     Family History  Problem Relation Age of Onset  . Alcohol abuse Neg Hx   . Arthritis Neg Hx   . Asthma Neg Hx   . Birth defects Neg Hx   . Cancer Neg Hx   . COPD Neg Hx   . Depression Neg Hx   . Diabetes Neg Hx   . Drug abuse Neg Hx   . Early death Neg Hx   . Hearing loss Neg Hx   . Heart disease Neg Hx   . Hyperlipidemia Neg Hx   . Hypertension Neg Hx   . Kidney disease Neg Hx   . Learning disabilities Neg Hx   . Mental illness Neg Hx   . Mental retardation Neg Hx   . Miscarriages / Stillbirths Neg Hx   . Stroke Neg Hx   . Vision loss Neg Hx    History  Substance Use Topics  . Smoking status: Never Smoker   . Smokeless tobacco: Never Used  . Alcohol Use: Yes     Comment: occ   OB History   Grav Para Term Preterm Abortions TAB SAB Ect Mult Living   1 1 1       1      Review of Systems  Constitutional: Negative for fever and chills.  HENT: Positive for congestion, postnasal drip and rhinorrhea. Negative for ear pain, sore throat and trouble swallowing.   Respiratory: Negative for cough and shortness of breath.   Gastrointestinal: Positive for nausea. Negative  for vomiting and diarrhea.    Allergies  Review of patient's allergies indicates no known allergies.  Home Medications   Prior to Admission medications   Medication Sig Start Date End Date Taking? Authorizing Provider  amoxicillin-clavulanate (AUGMENTIN) 875-125 MG per tablet Take 1 tablet by mouth 2 (two) times daily. 08/18/14   Charm RingsErin J Nahome Bublitz, MD  cetirizine (ZYRTEC) 10 MG tablet Take 1 tablet (10 mg total) by mouth daily as needed for allergies. 08/18/14   Charm RingsErin J Maliya Marich, MD  Ferrous Sulfate (IRON) 142 (45 FE) MG TBCR Take 1 tablet by mouth daily.    Historical Provider, MD  ibuprofen (ADVIL,MOTRIN) 200 MG tablet Take 600 mg by mouth every 6 (six) hours as needed for moderate pain.    Historical Provider, MD  ipratropium (ATROVENT) 0.06 % nasal spray Place 2 sprays into both nostrils 4 (four) times daily. 08/18/14   Charm RingsErin J Lilliana Turner, MD  promethazine (PHENERGAN) 25 MG tablet Take 1 tablet (25 mg total) by mouth every 6 (six) hours as needed for nausea or vomiting. 08/18/14   Charm RingsErin J Aldora Perman, MD   BP 100/60  Pulse 77  Temp(Src) 97.5 F (36.4 C) (Oral)  Resp 16  SpO2 98%  LMP 08/02/2014 Physical Exam  Constitutional: She is oriented to person, place, and time. She appears well-developed and well-nourished. No distress.  HENT:  Head: Normocephalic and atraumatic.  Right Ear: Tympanic membrane and external ear normal.  Left Ear: Tympanic membrane and external ear normal.  Nose: Mucosal edema and rhinorrhea present. Right sinus exhibits maxillary sinus tenderness and frontal sinus tenderness. Left sinus exhibits maxillary sinus tenderness and frontal sinus tenderness.  Mouth/Throat: Mucous membranes are normal. No oropharyngeal exudate or posterior oropharyngeal erythema.  Mild cobblestoning present  Eyes: Conjunctivae are normal. Right eye exhibits no discharge. Left eye exhibits no discharge.  Neck: Neck supple.  Cardiovascular: Normal rate, regular rhythm and normal heart sounds.   No  murmur heard. Pulmonary/Chest: Effort normal and breath sounds normal. No respiratory distress. She has no wheezes. She has no rales.  Lymphadenopathy:    She has no cervical adenopathy.  Neurological: She is alert and oriented to person, place, and time.  Skin: Skin is warm and dry. No rash noted.    ED Course  Procedures (including critical care time) Labs Review Labs Reviewed - No data to display  Imaging Review No results found.   MDM   1. Acute maxillary sinusitis, recurrence not specified    This is likely viral. Symptomatic treatment with Atrovent nasal spray, Phenergan, cetirizine. Recommended frequent use of nasal saline spray. Prescription for Augmentin provided to be filled if no improvement in 2 days. Reviewed warning signs to return as in after visit summary.    Charm RingsErin J Marielis Samara, MD 08/18/14 [id number]

## 2014-08-18 NOTE — ED Notes (Signed)
C/o poss sinus inf onset 6 days Sx include: HA, PND, nauseas, facial pressure Denies f/v/d, SOB, wheezing Taking ibup w/no relief Alert, no signs of acute disterss.

## 2014-08-18 NOTE — Discharge Instructions (Signed)
Take zyrtec 1 pill daily. Use the atrovent nasal spray 4 times a day. Use nasal saline spray as often as you can. Use the phenergan as needed for nausea. If you are not improving by Friday, fill the antibiotic prescription.  If you develop fevers, severe headache, are unable to tolerate fluids, please come back.

## 2014-08-30 ENCOUNTER — Encounter (HOSPITAL_COMMUNITY): Payer: Self-pay | Admitting: Emergency Medicine

## 2014-11-04 ENCOUNTER — Inpatient Hospital Stay (HOSPITAL_COMMUNITY)
Admission: AD | Admit: 2014-11-04 | Discharge: 2014-11-04 | Disposition: A | Discharge status: Home / Self Care | Payer: Medicaid Other | Source: Ambulatory Visit | Attending: Obstetrics & Gynecology | Admitting: Obstetrics & Gynecology

## 2014-11-04 ENCOUNTER — Encounter (HOSPITAL_COMMUNITY): Payer: Self-pay | Admitting: *Deleted

## 2014-11-04 DIAGNOSIS — N9089 Other specified noninflammatory disorders of vulva and perineum: Secondary | ICD-10-CM | POA: Diagnosis present

## 2014-11-04 LAB — URINALYSIS, ROUTINE W REFLEX MICROSCOPIC
BILIRUBIN URINE: NEGATIVE
Glucose, UA: NEGATIVE mg/dL
Ketones, ur: NEGATIVE mg/dL
Leukocytes, UA: NEGATIVE
Nitrite: NEGATIVE
Protein, ur: NEGATIVE mg/dL
Specific Gravity, Urine: 1.01 (ref 1.005–1.030)
Urobilinogen, UA: 0.2 mg/dL (ref 0.0–1.0)
pH: 6.5 (ref 5.0–8.0)

## 2014-11-04 LAB — WET PREP, GENITAL
CLUE CELLS WET PREP: NONE SEEN
Trich, Wet Prep: NONE SEEN
Yeast Wet Prep HPF POC: NONE SEEN

## 2014-11-04 LAB — POCT PREGNANCY, URINE: PREG TEST UR: NEGATIVE

## 2014-11-04 LAB — URINE MICROSCOPIC-ADD ON

## 2014-11-04 NOTE — Discharge Instructions (Signed)
Colposcopy Colposcopy is a procedure to examine your cervix and vagina, or the area around the outside of your vagina, for abnormalities or signs of disease. The procedure is done using a lighted microscope called a colposcope. Tissue samples may be collected during the colposcopy if your health care provider finds any unusual cells. A colposcopy may be done if a woman has:  An abnormal Pap test. A Pap test is a medical test done to evaluate cells that are on the surface of the cervix.  A Pap test result that is suggestive of human papillomavirus (HPV). This virus can cause genital warts and is linked to the development of cervical cancer.  A sore on her cervix and the results of a Pap test were normal.  Genital warts on the cervix or in or around the outside of the vagina.  A mother who took the drug diethylstilbestrol (DES) while pregnant.  Painful intercourse.  Vaginal bleeding, especially after sexual intercourse. LET YOUR HEALTH CARE PROVIDER KNOW ABOUT:  Any allergies you have.  All medicines you are taking, including vitamins, herbs, eye drops, creams, and over-the-counter medicines.  Previous problems you or members of your family have had with the use of anesthetics.  Any blood disorders you have.  Previous surgeries you have had.  Medical conditions you have. RISKS AND COMPLICATIONS Generally, a colposcopy is a safe procedure. However, as with any procedure, complications can occur. Possible complications include:  Bleeding.  Infection.  Missed lesions. BEFORE THE PROCEDURE   Tell your health care provider if you have your menstrual period. A colposcopy typically is not done during menstruation.  For 24 hours before the colposcopy, do not:  [place].  Use tampons.  Use medicines, creams, or suppositories in the vagina.  Have sexual intercourse. PROCEDURE  During the procedure, you will be lying on your back with your feet in foot rests (stirrups). A warm  metal or plastic instrument (speculum) will be placed in your vagina to keep it open and to allow the health care provider to see the cervix. The colposcope will be placed outside the vagina. It will be used to magnify and examine the cervix, vagina, and the area around the outside of the vagina. A small amount of liquid solution will be placed on the area that is to be viewed. This solution will make it easier to see the abnormal cells. Your health care provider will use tools to suck out mucus and cells from the canal of the cervix. Then he or she will record the location of the abnormal areas. If a biopsy is done during the procedure, a medicine will usually be given to numb the area (local anesthetic). You may feel mild pain or cramping while the biopsy is done. After the procedure, tissue samples collected during the biopsy will be sent to a lab for analysis. AFTER THE PROCEDURE  You will be given instructions on when to follow up with your health care provider for your test results. It is important to keep your appointment. Document Released: 01/05/2003 Document Revised: 06/17/2013 Document Reviewed: 05/14/2013 Exit[employer] Patient Information 2015 [employer]. This information is not intended to replace advice given to you by your health care provider. Make sure you discuss any questions you have with your health care provider.  

## 2014-11-04 NOTE — MAU Note (Signed)
Lower abd pain & pressure for one week, also vaginal itching.  Urinary frequency, denies pain or burning.  Denies bleeding or discharge.

## 2014-11-04 NOTE — MAU Note (Signed)
Pt states she has been having pelvic pressure for about 1 week.

## 2014-11-04 NOTE — MAU Provider Note (Signed)
History     CSN: [id number]  Arrival date and time: 11/04/14 1612   First Provider Initiated Contact with Patient 11/04/14 1748      Chief Complaint  Patient presents with  . Abdominal Pain   HPI Kellie Davila is a 29 y.o. G1P1001 who presents today with vaginal irritation and discharge x one week. She denies any odor, and states that this discharge is "light". However, it is causing irritation. She would like STD testing today.   Past Medical History  Diagnosis Date  . No pertinent past medical history   . Abnormal Pap smear   . Anemia   . Anxiety     worse when on meds    Past Surgical History  Procedure Laterality Date  . Cesarean section      Family History  Problem Relation Age of Onset  . Alcohol abuse Neg Hx   . Arthritis Neg Hx   . Asthma Neg Hx   . Birth defects Neg Hx   . Cancer Neg Hx   . COPD Neg Hx   . Depression Neg Hx   . Diabetes Neg Hx   . Drug abuse Neg Hx   . Early death Neg Hx   . Hearing loss Neg Hx   . Heart disease Neg Hx   . Hyperlipidemia Neg Hx   . Hypertension Neg Hx   . Kidney disease Neg Hx   . Learning disabilities Neg Hx   . Mental illness Neg Hx   . Mental retardation Neg Hx   . Miscarriages / Stillbirths Neg Hx   . Stroke Neg Hx   . Vision loss Neg Hx     History  Substance Use Topics  . Smoking status: Never Smoker   . Smokeless tobacco: Never Used  . Alcohol Use: Yes     Comment: occ    Allergies: No Known Allergies  Prescriptions prior to admission  Medication Sig Dispense Refill Last Dose  . cetirizine (ZYRTEC) 10 MG tablet Take 1 tablet (10 mg total) by mouth daily as needed for allergies. 30 tablet 0 Past Week at Unknown time  . Ferrous Sulfate (IRON) 142 (45 FE) MG TBCR Take 1 tablet by mouth daily.   11/03/2014 at Unknown time  . ipratropium (ATROVENT) 0.06 % nasal spray Place 2 sprays into both nostrils 4 (four) times daily. 15 mL 12 Past Week at Unknown time  . amoxicillin-clavulanate (AUGMENTIN) 875-125  MG per tablet Take 1 tablet by mouth 2 (two) times daily. (Patient not taking: Reported on 11/04/2014) 20 tablet 0   . ibuprofen (ADVIL,MOTRIN) 200 MG tablet Take 600 mg by mouth every 6 (six) hours as needed for moderate pain.   more than one month  . promethazine (PHENERGAN) 25 MG tablet Take 1 tablet (25 mg total) by mouth every 6 (six) hours as needed for nausea or vomiting. 20 tablet 0 more than one month    ROS Physical Exam   Blood pressure 109/62, pulse 80, temperature 98.1 F (36.7 C), temperature source Oral, resp. rate 16, height 5\' 2"  (1.575 m), weight 69.582 kg (153 lb 6.4 oz), last menstrual period 10/22/2014.  Physical Exam  Nursing note and vitals reviewed. Constitutional: She is oriented to person, place, and time. She appears well-developed and well-nourished.  HENT:  Head: Normocephalic.  Cardiovascular: Normal rate.   Respiratory: Effort normal.  GI: Soft. There is no tenderness. There is no rebound.  Genitourinary:   External: no lesion Vagina: small amount of white  discharge Cervix: pink, smooth, no CMT Uterus: NSSC Adnexa: NT   Neurological: She is alert and oriented to person, place, and time.  Skin: Skin is warm and dry.  Psychiatric: She has a normal mood and affect.    MAU Course  Procedures  Results for orders placed or performed during the hospital encounter of 11/04/14 (from the past 24 hour(s))  Urinalysis, Routine w reflex microscopic     Status: Abnormal   Collection Time: 11/04/14  4:31 PM  Result Value Ref Range   Color, Urine YELLOW YELLOW   APPearance CLEAR CLEAR   Specific Gravity, Urine 1.010 1.005 - 1.030   pH 6.5 5.0 - 8.0   Glucose, UA NEGATIVE NEGATIVE mg/dL   Hgb urine dipstick TRACE (A) NEGATIVE   Bilirubin Urine NEGATIVE NEGATIVE   Ketones, ur NEGATIVE NEGATIVE mg/dL   Protein, ur NEGATIVE NEGATIVE mg/dL   Urobilinogen, UA 0.2 0.0 - 1.0 mg/dL   Nitrite NEGATIVE NEGATIVE   Leukocytes, UA NEGATIVE NEGATIVE  Urine  microscopic-add on     Status: Abnormal   Collection Time: 11/04/14  4:31 PM  Result Value Ref Range   Squamous Epithelial / LPF FEW (A) RARE   WBC, UA 0-2 <3 WBC/hpf  Pregnancy, urine POC     Status: None   Collection Time: 11/04/14  5:12 PM  Result Value Ref Range   Preg Test, Ur NEGATIVE NEGATIVE  Wet prep, genital     Status: Abnormal   Collection Time: 11/04/14  5:54 PM  Result Value Ref Range   Yeast Wet Prep HPF POC NONE SEEN NONE SEEN   Trich, Wet Prep NONE SEEN NONE SEEN   Clue Cells Wet Prep HPF POC NONE SEEN NONE SEEN   WBC, Wet Prep HPF POC FEW (A) NONE SEEN     Assessment and Plan   1. Vulvar irritation    GC/CT, HIV and UC pending Return to MAU as needed FU with the clinic as planned   Follow-up Information    Follow up with [facility].   Specialty:  Obstetrics and Gynecology   Why:  As scheduled   Contact information:   [address] [place] [address] [phone]       Tawnya Crook 11/04/2014, 6:09 PM

## 2014-11-05 LAB — URINE CULTURE: Colony Count: 30000

## 2014-11-06 LAB — HIV ANTIBODY (ROUTINE TESTING W REFLEX)
HIV 1/O/2 Abs-Index Value: <1 (ref <1.00)
HIV-1/HIV-2 Ab: NONREACTIVE

## 2014-11-06 LAB — GC/CHLAMYDIA PROBE AMP
CT Probe RNA: NEGATIVE
GC Probe RNA: NEGATIVE

## 2014-11-14 ENCOUNTER — Inpatient Hospital Stay (HOSPITAL_COMMUNITY)
Admission: AD | Admit: 2014-11-14 | Discharge: 2014-11-14 | Disposition: A | Discharge status: Home / Self Care | Payer: Medicaid Other | Source: Ambulatory Visit | Attending: Obstetrics & Gynecology | Admitting: Obstetrics & Gynecology

## 2014-11-14 ENCOUNTER — Encounter (HOSPITAL_COMMUNITY): Payer: Self-pay | Admitting: *Deleted

## 2014-11-14 DIAGNOSIS — N909 Noninflammatory disorder of vulva and perineum, unspecified: Secondary | ICD-10-CM | POA: Insufficient documentation

## 2014-11-14 DIAGNOSIS — N9089 Other specified noninflammatory disorders of vulva and perineum: Secondary | ICD-10-CM

## 2014-11-14 LAB — URINALYSIS, ROUTINE W REFLEX MICROSCOPIC
BILIRUBIN URINE: NEGATIVE
GLUCOSE, UA: NEGATIVE mg/dL
Hgb urine dipstick: NEGATIVE
Ketones, ur: NEGATIVE mg/dL
LEUKOCYTES UA: NEGATIVE
NITRITE: NEGATIVE
Protein, ur: NEGATIVE mg/dL
SPECIFIC GRAVITY, URINE: 1.015 (ref 1.005–1.030)
UROBILINOGEN UA: 0.2 mg/dL (ref 0.0–1.0)
pH: 8 (ref 5.0–8.0)

## 2014-11-14 LAB — WET PREP, GENITAL
Clue Cells Wet Prep HPF POC: NONE SEEN
Trich, Wet Prep: NONE SEEN
Yeast Wet Prep HPF POC: NONE SEEN

## 2014-11-14 LAB — POCT PREGNANCY, URINE: PREG TEST UR: NEGATIVE

## 2014-11-14 MED ORDER — FAMOTIDINE 20 MG PO TABS
20.0000 mg | ORAL_TABLET | Freq: Once | ORAL | Status: AC
  Administered 2014-11-14: 20 mg via ORAL
  Filled 2014-11-14: qty 1

## 2014-11-14 MED ORDER — FLUCONAZOLE 150 MG PO TABS: 150.0000 mg | ORAL_TABLET | Freq: Once | ORAL | Status: DC

## 2014-11-14 NOTE — Discharge Instructions (Signed)

## 2014-11-14 NOTE — MAU Note (Signed)
Pt reports vaginal itching for the last week, has used Monsostat and it made it worsen. Also reports nausea for the last four days.

## 2014-11-14 NOTE — MAU Provider Note (Signed)
CSN: [id number]     Arrival date & time 11/14/14  1942 History   None    No chief complaint on file.    (Consider location/radiation/quality/duration/timing/severity/associated sxs/prior Treatment) HPI Kellie Davila is a 29 y.o. G1P1001, LMP 10/22/14. Condoms occ for BC. She presents with c/o itching or 1 wk. She Monistat,it made it worse, she thinks it is from shaving. No change in discharge or odor. No UTI S&S, itching gets worse with voiding. She has had nausea x 1 wk worse in am and at bedtime. Takes Pepto to go to sleep, wakes up in a few hrs with pain again. It is located upper abd. ame partnr x 1 yr, found out hs had been with others recently.  Past Medical History  Diagnosis Date  . No pertinent past medical history   . Abnormal Pap smear   . Anemia   . Anxiety     worse when on meds   Past Surgical History  Procedure Laterality Date  . Cesarean section     Family History  Problem Relation Age of Onset  . Alcohol abuse Neg Hx   . Arthritis Neg Hx   . Asthma Neg Hx   . Birth defects Neg Hx   . Cancer Neg Hx   . COPD Neg Hx   . Depression Neg Hx   . Diabetes Neg Hx   . Drug abuse Neg Hx   . Early death Neg Hx   . Hearing loss Neg Hx   . Heart disease Neg Hx   . Hyperlipidemia Neg Hx   . Hypertension Neg Hx   . Kidney disease Neg Hx   . Learning disabilities Neg Hx   . Mental illness Neg Hx   . Mental retardation Neg Hx   . Miscarriages / Stillbirths Neg Hx   . Stroke Neg Hx   . Vision loss Neg Hx    History  Substance Use Topics  . Smoking status: Never Smoker   . Smokeless tobacco: Never Used  . Alcohol Use: Yes     Comment: occ   OB History    Gravida Para Term Preterm AB TAB SAB Ectopic Multiple Living   1 1 1       1      Review of Systems  Constitutional: Negative for fever and chills.  Gastrointestinal: Positive for nausea. Negative for vomiting, abdominal pain, diarrhea and constipation.  Genitourinary: Negative for dysuria, urgency,  frequency, vaginal bleeding and vaginal discharge.      Allergies  Review of patient's allergies indicates no known allergies.  Home Medications   Prior to Admission medications   Medication Sig Start Date End Date Taking? Authorizing Provider  BIOTIN 5000 PO Take 1 capsule by mouth daily.   Yes Historical Provider, MD  Cyanocobalamin (B-12) 5000 MCG CAPS Take 1 capsule by mouth daily.    Yes Historical Provider, MD  Ferrous Sulfate (IRON) 142 (45 FE) MG TBCR Take 1 tablet by mouth daily.   Yes Historical Provider, MD  ibuprofen (ADVIL,MOTRIN) 200 MG tablet Take 400 mg by mouth every 6 (six) hours as needed for moderate pain.    Yes Historical Provider, MD  ipratropium (ATROVENT) 0.06 % nasal spray Place 2 sprays into both nostrils 4 (four) times daily. Patient taking differently: Place 2 sprays into both nostrils 4 (four) times daily as needed for rhinitis.  08/18/14  Yes Charm RingsErin J Honig, MD  Tioconazole (MONISTAT 1 VA) Place 1 each vaginally daily as needed (for yeast  infection symptoms.).   Yes Historical Provider, MD  cetirizine (ZYRTEC) 10 MG tablet Take 1 tablet (10 mg total) by mouth daily as needed for allergies. 08/18/14   Charm Rings, MD  fluconazole (DIFLUCAN) 150 MG tablet Take 1 tablet (150 mg total) by mouth once. May repeat in 2 days if still having symptoms. 11/14/14   [name], CNM  promethazine (PHENERGAN) 25 MG tablet Take 1 tablet (25 mg total) by mouth every 6 (six) hours as needed for nausea or vomiting. Patient not taking: Reported on 11/14/2014 08/18/14   Charm Rings, MD   BP 107/58 mmHg  Pulse 55  Temp(Src) 97.3 F (36.3 C) (Oral)  Resp 16  Ht  (1.575 m)  Wt 70.761 kg (156 lb)  BMI 28.53 kg/m2  SpO2 100%  LMP 10/22/2014 (Approximate) Physical Exam  Genitourinary:  Pelvic exam-  Ext gen- nl anatomy, skin intact- no erythema Vagina- small amt thick white discharge Cx- closed Uterus- nl size, non tender Adn- no masses palp, non tender     ED Course  Procedures (including critical care time) Labs Review Results for orders placed or performed during the hospital encounter of 11/14/14 (from the past 24 hour(s))  Urinalysis, Routine w reflex microscopic     Status: None   Collection Time: 11/14/14  7:57 PM  Result Value Ref Range   Color, Urine YELLOW YELLOW   APPearance CLEAR CLEAR   Specific Gravity, Urine 1.015 1.005 - 1.030   pH 8.0 5.0 - 8.0   Glucose, UA NEGATIVE NEGATIVE mg/dL   Hgb urine dipstick NEGATIVE NEGATIVE   Bilirubin Urine NEGATIVE NEGATIVE   Ketones, ur NEGATIVE NEGATIVE mg/dL   Protein, ur NEGATIVE NEGATIVE mg/dL   Urobilinogen, UA 0.2 0.0 - 1.0 mg/dL   Nitrite NEGATIVE NEGATIVE   Leukocytes, UA NEGATIVE NEGATIVE  Pregnancy, urine POC     Status: None   Collection Time: 11/14/14  8:04 PM  Result Value Ref Range   Preg Test, Ur NEGATIVE NEGATIVE  Wet prep, genital     Status: Abnormal   Collection Time: 11/14/14  9:00 PM  Result Value Ref Range   Yeast Wet Prep HPF POC NONE SEEN NONE SEEN   Trich, Wet Prep NONE SEEN NONE SEEN   Clue Cells Wet Prep HPF POC NONE SEEN NONE SEEN   WBC, Wet Prep HPF POC FEW (A) NONE SEEN   9:10 pm Labs pending, care to Zorita Pang, CNM Imaging Review No results found.   EKG Interpretation None      MDM   Final diagnoses:  Vulvar irritation   D/C home RX diflucan Return to MAU for emergencies  Follow-up Information    Follow up with [facility] [place].   Why:  If symptoms worsen   Contact information:   [address] [place] [address] [phone]

## 2014-11-15 ENCOUNTER — Emergency Department (HOSPITAL_COMMUNITY)
Admission: EM | Admit: 2014-11-15 | Discharge: 2014-11-15 | Disposition: A | Discharge status: Home / Self Care | Payer: Medicaid Other | Source: Home / Self Care | Attending: Emergency Medicine | Admitting: Emergency Medicine

## 2014-11-15 ENCOUNTER — Encounter (HOSPITAL_COMMUNITY): Payer: Self-pay

## 2014-11-15 DIAGNOSIS — A084 Viral intestinal infection, unspecified: Secondary | ICD-10-CM | POA: Diagnosis not present

## 2014-11-15 MED ORDER — ONDANSETRON 4 MG PO TBDP
4.0000 mg | ORAL_TABLET | Freq: Once | ORAL | Status: AC
  Administered 2014-11-15: 4 mg via ORAL

## 2014-11-15 MED ORDER — PROMETHAZINE HCL 25 MG PO TABS: 25.0000 mg | ORAL_TABLET | Freq: Four times a day (QID) | ORAL | Status: DC | PRN

## 2014-11-15 MED ORDER — ONDANSETRON 4 MG PO TBDP
ORAL_TABLET | ORAL | Status: AC
  Filled 2014-11-15: qty 1

## 2014-11-15 NOTE — ED Notes (Signed)
Reports she felt better after medication. Discussed precautions w new Rx, and advised plenty of fluids to prevent dehydration

## 2014-11-15 NOTE — Discharge Instructions (Signed)
You likely have a stomach bug. Take phenergan every 6-8 hours as needed for nausea and vomiting. You can take Loperimide over the counter for diarrhea. Make sure you are drinking plenty of fluids. You should start to feel better in the next 3-4 days.  If you develop fevers, severe abdominal pain, or see blood in either the vomit or stool, please come back.

## 2014-11-15 NOTE — ED Notes (Signed)
Reportedly has been nauseated since Sunday. No relief from medication given to her at [facility]. Denies changes in her symptoms since yesterday

## 2014-11-15 NOTE — ED Provider Notes (Signed)
CSN: [id number]     Arrival date & time 11/15/14  1408 History   First MD Initiated Contact with Patient 11/15/14 1419     Chief Complaint  Patient presents with  . Nausea   (Consider location/radiation/quality/duration/timing/severity/associated sxs/prior Treatment) HPI She is a 29 year old woman here for evaluation of nausea and vomiting. She states for the last week she has had nausea, vomiting, diarrhea. There is been no blood in the vomit or stool. She denies any fevers. No abdominal pain. She was seen at [facility] hospital last night and her urine was tested. It was negative, but they did not treat her nausea.  Past Medical History  Diagnosis Date  . No pertinent past medical history   . Abnormal Pap smear   . Anemia   . Anxiety     worse when on meds   Past Surgical History  Procedure Laterality Date  . Cesarean section     Family History  Problem Relation Age of Onset  . Alcohol abuse Neg Hx   . Arthritis Neg Hx   . Asthma Neg Hx   . Birth defects Neg Hx   . Cancer Neg Hx   . COPD Neg Hx   . Depression Neg Hx   . Diabetes Neg Hx   . Drug abuse Neg Hx   . Early death Neg Hx   . Hearing loss Neg Hx   . Heart disease Neg Hx   . Hyperlipidemia Neg Hx   . Hypertension Neg Hx   . Kidney disease Neg Hx   . Learning disabilities Neg Hx   . Mental illness Neg Hx   . Mental retardation Neg Hx   . Miscarriages / Stillbirths Neg Hx   . Stroke Neg Hx   . Vision loss Neg Hx    History  Substance Use Topics  . Smoking status: Never Smoker   . Smokeless tobacco: Never Used  . Alcohol Use: Yes     Comment: occ   OB History    Gravida Para Term Preterm AB TAB SAB Ectopic Multiple Living   Review of Systems  Constitutional: Positive for chills. Negative for fever.  Respiratory: Negative for cough and shortness of breath.   Cardiovascular: Negative for chest pain.  Gastrointestinal: Positive for nausea, vomiting and diarrhea. Negative for abdominal  pain and blood in stool.    Allergies  Review of patient's allergies indicates no known allergies.  Home Medications   Prior to Admission medications   Medication Sig Start Date End Date Taking? Authorizing Provider  BIOTIN 5000 PO Take 1 capsule by mouth daily.    Historical Provider, MD  cetirizine (ZYRTEC) 10 MG tablet Take 1 tablet (10 mg total) by mouth daily as needed for allergies. 08/18/14   Charm Rings, MD  Cyanocobalamin (B-12) 5000 MCG CAPS Take 1 capsule by mouth daily.     Historical Provider, MD  Ferrous Sulfate (IRON) 142 (45 FE) MG TBCR Take 1 tablet by mouth daily.    Historical Provider, MD  fluconazole (DIFLUCAN) 150 MG tablet Take 1 tablet (150 mg total) by mouth once. May repeat in 2 days if still having symptoms. 11/14/14   [name], CNM  ibuprofen (ADVIL,MOTRIN) 200 MG tablet Take 400 mg by mouth every 6 (six) hours as needed for moderate pain.     Historical Provider, MD  ipratropium (ATROVENT) 0.06 % nasal spray Place 2 sprays into  both nostrils 4 (four) times daily. Patient taking differently: Place 2 sprays into both nostrils 4 (four) times daily as needed for rhinitis.  08/18/14   Charm RingsErin J Benjamyn Hestand, MD  promethazine (PHENERGAN) 25 MG tablet Take 1 tablet (25 mg total) by mouth every 6 (six) hours as needed for nausea or vomiting. 11/15/14   Charm RingsErin J Santiaga Butzin, MD  Tioconazole (MONISTAT 1 VA) Place 1 each vaginally daily as needed (for yeast infection symptoms.).    Historical Provider, MD   BP 100/68 mmHg  Pulse 55  Temp(Src) 98.4 F (36.9 C) (Oral)  Resp 12  SpO2 99%  LMP 10/22/2014 (Approximate) Physical Exam  Constitutional: She is oriented to person, place, and time. She appears well-developed and well-nourished. No distress.  Neck: Neck supple.  Cardiovascular: Normal rate.   Pulmonary/Chest: Effort normal.  Abdominal: Soft. Bowel sounds are normal. She exhibits no distension. There is no tenderness. There is no rebound and no guarding.   Neurological: She is alert and oriented to person, place, and time.    ED Course  Procedures (including critical care time) Labs Review Labs Reviewed - No data to display  Imaging Review No results found.   MDM   1. Viral gastroenteritis    Zofran 4 mg ODT given.  Symptomatic treatment with Phenergan as needed at home. It is okay to use loperamide for the diarrhea. Reviewed reasons to return as in after visit summary. Follow-up as needed.    Charm RingsErin J Ayansh Feutz, MD 11/15/14 [phone]

## 2014-12-16 ENCOUNTER — Encounter: Payer: Self-pay | Admitting: *Deleted

## 2014-12-27 ENCOUNTER — Encounter: Payer: Self-pay | Admitting: Nurse Practitioner

## 2014-12-27 ENCOUNTER — Other Ambulatory Visit (HOSPITAL_COMMUNITY)
Admission: RE | Admit: 2014-12-27 | Discharge: 2014-12-27 | Disposition: A | Discharge status: Home / Self Care | Payer: Medicaid Other | Source: Ambulatory Visit | Attending: Nurse Practitioner | Admitting: Nurse Practitioner

## 2014-12-27 ENCOUNTER — Ambulatory Visit (INDEPENDENT_AMBULATORY_CARE_PROVIDER_SITE_OTHER): Payer: Medicaid Other | Admitting: Nurse Practitioner

## 2014-12-27 VITALS — BP 109/66 | HR 91 | Ht 62.0 in | Wt 157.0 lb

## 2014-12-27 DIAGNOSIS — R87619 Unspecified abnormal cytological findings in specimens from cervix uteri: Secondary | ICD-10-CM | POA: Diagnosis present

## 2014-12-27 DIAGNOSIS — Z01812 Encounter for preprocedural laboratory examination: Secondary | ICD-10-CM

## 2014-12-27 DIAGNOSIS — N870 Mild cervical dysplasia: Secondary | ICD-10-CM

## 2014-12-27 LAB — POCT PREGNANCY, URINE: PREG TEST UR: NEGATIVE

## 2014-12-27 NOTE — Progress Notes (Signed)
    GYNECOLOGY CLINIC COLPOSCOPY PROCEDURE NOTE  29 y.o. 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  Patient given informed consent, signed copy in the chart, time out was performed.  Placed in lithotomy position. Cervix viewed with speculum and colposcope after application of acetic acid.   Colposcopy adequate? No  visible lesion(s) at 7, 10, 3  o'clock; corresponding biopsies obtained.  ECC specimen obtained. All specimens were labelled and sent to pathology.  Patient was given post procedure instructions.  Will follow up pathology and manage accordingly.  Routine preventative health maintenance measures emphasized.  Pt has had cryotherapy and has abnormal structure of cervix.   Juliane Guest, Rubbie BattiestLinda Miller, NP [facility]

## 2014-12-27 NOTE — Patient Instructions (Signed)
COLPOSCOPY POST-PROCEDURE INSTRUCTIONS  1. You may take Ibuprofen, Aleve or Tylenol for cramping if needed.  2. If Monsel's solution was used, you will have a black discharge.  3. Light bleeding is normal.  If bleeding is heavier than your period, please call.  4. Put nothing in your vagina until the bleeding or discharge stops (usually 2 or3 days).  5. We will call you within one week with biopsy results or discuss the results at your follow-up appointment if needed. 6.  

## 2014-12-29 ENCOUNTER — Emergency Department (HOSPITAL_COMMUNITY)
Admission: EM | Admit: 2014-12-29 | Discharge: 2014-12-29 | Disposition: A | Discharge status: Home / Self Care | Payer: Medicaid Other | Source: Home / Self Care | Attending: Emergency Medicine | Admitting: Emergency Medicine

## 2014-12-29 ENCOUNTER — Encounter (HOSPITAL_COMMUNITY): Payer: Self-pay | Admitting: Emergency Medicine

## 2014-12-29 DIAGNOSIS — A084 Viral intestinal infection, unspecified: Secondary | ICD-10-CM | POA: Diagnosis not present

## 2014-12-29 DIAGNOSIS — R112 Nausea with vomiting, unspecified: Secondary | ICD-10-CM

## 2014-12-29 LAB — POCT URINALYSIS DIP (DEVICE)
BILIRUBIN URINE: NEGATIVE
GLUCOSE, UA: NEGATIVE mg/dL
Hgb urine dipstick: NEGATIVE
KETONES UR: NEGATIVE mg/dL
Leukocytes, UA: NEGATIVE
NITRITE: NEGATIVE
Protein, ur: NEGATIVE mg/dL
Specific Gravity, Urine: 1.02 (ref 1.005–1.030)
Urobilinogen, UA: 0.2 mg/dL (ref 0.0–1.0)
pH: 8.5 — ABNORMAL HIGH (ref 5.0–8.0)

## 2014-12-29 LAB — POCT PREGNANCY, URINE: Preg Test, Ur: NEGATIVE

## 2014-12-29 MED ORDER — PROMETHAZINE HCL 25 MG PO TABS: 25.0000 mg | ORAL_TABLET | Freq: Four times a day (QID) | ORAL | Status: DC | PRN

## 2014-12-29 NOTE — ED Notes (Signed)
C/o vomiting, nauseas, diarrhea onset 3 days Has had x2 episodes of vomiting today and x1 loose stool today Denies fevers Alert, no signs of acute distress.

## 2014-12-29 NOTE — ED Provider Notes (Signed)
CSN: [id number]     Arrival date & time 12/29/14  1400 History   First MD Initiated Contact with Patient 12/29/14 1504     Chief Complaint  Patient presents with  . Emesis   (Consider location/radiation/quality/duration/timing/severity/associated sxs/prior Treatment) HPI Comments: 29 year old female complaining of nausea and vomiting with diarrhea for 2-3 days. She is feeling a little bit better today. She had watery diarrhea 3-4 times yesterday and only twice today. She has seen no blood in the diarrhea or emesis. She has vomited less today but the last time was just prior to coming to the urgent care. Denies having fever. Denies upper respiratory symptoms.   Past Medical History  Diagnosis Date  . No pertinent past medical history   . Abnormal Pap smear   . Anemia   . Anxiety     worse when on meds   Past Surgical History  Procedure Laterality Date  . Cesarean section     Family History  Problem Relation Age of Onset  . Alcohol abuse Neg Hx   . Arthritis Neg Hx   . Asthma Neg Hx   . Birth defects Neg Hx   . Cancer Neg Hx   . COPD Neg Hx   . Depression Neg Hx   . Diabetes Neg Hx   . Drug abuse Neg Hx   . Early death Neg Hx   . Hearing loss Neg Hx   . Heart disease Neg Hx   . Hyperlipidemia Neg Hx   . Hypertension Neg Hx   . Kidney disease Neg Hx   . Learning disabilities Neg Hx   . Mental illness Neg Hx   . Mental retardation Neg Hx   . Miscarriages / Stillbirths Neg Hx   . Stroke Neg Hx   . Vision loss Neg Hx    History  Substance Use Topics  . Smoking status: Never Smoker   . Smokeless tobacco: Never Used  . Alcohol Use: Yes     Comment: occ   OB History    Gravida Para Term Preterm AB TAB SAB Ectopic Multiple Living   1 1 1       1      Review of Systems  Constitutional: Positive for activity change. Negative for fever and fatigue.  HENT: Negative.   Respiratory: Negative.   Cardiovascular: Negative.   Gastrointestinal: Positive for nausea, vomiting  and diarrhea. Negative for abdominal pain and blood in stool.  Genitourinary: Negative.   Skin: Negative.   Neurological: Negative.     Allergies  Review of patient's allergies indicates no known allergies.  Home Medications   Prior to Admission medications   Medication Sig Start Date End Date Taking? Authorizing Provider  BIOTIN 5000 PO Take 1 capsule by mouth daily.    Historical Provider, MD  Cyanocobalamin (B-12) 5000 MCG CAPS Take 1 capsule by mouth daily.     Historical Provider, MD  Ferrous Sulfate (IRON) 142 (45 FE) MG TBCR Take 1 tablet by mouth daily.    Historical Provider, MD  ibuprofen (ADVIL,MOTRIN) 200 MG tablet Take 400 mg by mouth every 6 (six) hours as needed for moderate pain.     Historical Provider, MD  promethazine (PHENERGAN) 25 MG tablet Take 1 tablet (25 mg total) by mouth every 6 (six) hours as needed for nausea. 12/29/14   Hayden Rasmussenavid Gergory Biello, NP  Tioconazole (MONISTAT 1 VA) Place 1 each vaginally daily as needed (for yeast infection symptoms.).    Historical Provider, MD   LMP  12/03/2014 Physical Exam  Constitutional: She is oriented to person, place, and time. She appears well-developed. No distress.  HENT:  Mouth/Throat: Oropharynx is clear and moist. No oropharyngeal exudate.  Eyes: Conjunctivae and EOM are normal.  Neck: Normal range of motion. Neck supple.  Cardiovascular: Normal rate, regular rhythm, normal heart sounds and intact distal pulses.   Pulmonary/Chest: Effort normal and breath sounds normal. No respiratory distress.  Abdominal: Soft. Bowel sounds are normal. She exhibits no distension and no mass. There is no tenderness. There is no rebound and no guarding.  Musculoskeletal: She exhibits no edema.  Lymphadenopathy:    She has no cervical adenopathy.  Neurological: She is alert and oriented to person, place, and time. She exhibits normal muscle tone.  Skin: Skin is warm.  Psychiatric: She has a normal mood and affect.  Nursing note and vitals  reviewed.   ED Course  Procedures (including critical care time) Labs Review Labs Reviewed - No data to display  Imaging Review No results found.   MDM   1. Viral gastroenteritis   2. Non-intractable vomiting with nausea, vomiting of unspecified type    Phenergan 25 mg as directed. Patient states that Zofran does not work well for her. Clear liquids in small frequent amounts and advance your diet as tolerated. Follow-up with PCP as needed.    Hayden Rasmussen, NP 12/29/14 1524

## 2014-12-29 NOTE — Discharge Instructions (Signed)
Nausea and Vomiting °Nausea means you feel sick to your stomach. Throwing up (vomiting) is a reflex where stomach contents come out of your mouth. °HOME CARE  °· Take medicine as told by your doctor. °· Do not force yourself to eat. However, you do need to drink fluids. °· If you feel like eating, eat a normal diet as told by your doctor. °· Eat rice, wheat, potatoes, bread, lean meats, yogurt, fruits, and vegetables. °· Avoid high-fat foods. °· Drink enough fluids to keep your pee (urine) clear or pale yellow. °· Ask your doctor how to replace body fluid losses (rehydrate). Signs of body fluid loss (dehydration) include: °· Feeling very thirsty. °· Dry lips and mouth. °· Feeling dizzy. °· Dark pee. °· Peeing less than normal. °· Feeling confused. °· Fast breathing or heart rate. °GET HELP RIGHT AWAY IF:  °· You have blood in your throw up. °· You have black or bloody poop (stool). °· You have a bad headache or stiff neck. °· You feel confused. °· You have bad belly (abdominal) pain. °· You have chest pain or trouble breathing. °· You do not pee at least once every 8 hours. °· You have cold, clammy skin. °· You keep throwing up after 24 to 48 hours. °· You have a fever. °MAKE SURE YOU:  °· Understand these instructions. °· Will watch your condition. °· Will get help right away if you are not doing well or get worse. °Document Released: 04/02/2008 Document Revised: 01/07/2012 Document Reviewed: 03/16/2011 °ExitCare® Patient Information ©2015 [employer]. This information is not intended to replace advice given to you by your health care provider. Make sure you discuss any questions you have with your health care provider. °Viral Gastroenteritis °Viral gastroenteritis is also known as stomach flu. This condition affects the stomach and intestinal tract. It can cause sudden diarrhea and vomiting. The illness typically lasts 3 to 8 days. Most people develop an immune response that eventually gets rid of the virus.  While this natural response develops, the virus can make you quite ill. °CAUSES  °Many different viruses can cause gastroenteritis, such as rotavirus or noroviruses. You can catch one of these viruses by consuming contaminated food or water. You may also catch a virus by sharing utensils or other personal items with an infected person or by touching a contaminated surface. °SYMPTOMS  °The most common symptoms are diarrhea and vomiting. These problems can cause a severe loss of body fluids (dehydration) and a body salt (electrolyte) imbalance. Other symptoms may include: °· Fever. °· Headache. °· Fatigue. °· Abdominal pain. °DIAGNOSIS  °Your caregiver can usually diagnose viral gastroenteritis based on your symptoms and a physical exam. A stool sample may also be taken to test for the presence of viruses or other infections. °TREATMENT  °This illness typically goes away on its own. Treatments are aimed at rehydration. The most serious cases of viral gastroenteritis involve vomiting so severely that you are not able to keep fluids down. In these cases, fluids must be given through an intravenous line (IV). °HOME CARE INSTRUCTIONS  °· Drink enough fluids to keep your urine clear or pale yellow. Drink small amounts of fluids frequently and increase the amounts as tolerated. °· Ask your caregiver for specific rehydration instructions. °· Avoid: °¨ Foods high in sugar. °¨ Alcohol. °¨ Carbonated drinks. °¨ Tobacco. °¨ Juice. °¨ Caffeine drinks. °¨ Extremely hot or cold fluids. °¨ Fatty, greasy foods. °¨ Too much intake of anything at one time. °¨ Dairy   Dairy products until 24 to 48 hours after diarrhea stops.  You may consume probiotics. Probiotics are active cultures of beneficial bacteria. They may lessen the amount and number of diarrheal stools in adults. Probiotics can be found in yogurt with active cultures and in supplements.  Wash your hands well to avoid spreading the virus.  Only take over-the-counter or  prescription medicines for pain, discomfort, or fever as directed by your caregiver. Do not give aspirin to children. Antidiarrheal medicines are not recommended.  Ask your caregiver if you should continue to take your regular prescribed and over-the-counter medicines.  Keep all follow-up appointments as directed by your caregiver. SEEK IMMEDIATE MEDICAL CARE IF:   You are unable to keep fluids down.  You do not urinate at least once every 6 to 8 hours.  You develop shortness of breath.  You notice blood in your stool or vomit. This may look like coffee grounds.  You have abdominal pain that increases or is concentrated in one small area (localized).  You have persistent vomiting or diarrhea.  You have a fever.  The patient is a child younger than 3 months, and he or she has a fever.  The patient is a child older than 3 months, and he or she has a fever and persistent symptoms.  The patient is a child older than 3 months, and he or she has a fever and symptoms suddenly get worse.  The patient is a baby, and he or she has no tears when crying. MAKE SURE YOU:   Understand these instructions.  Will watch your condition.  Will get help right away if you are not doing well or get worse. Document Released: 10/15/2005 Document Revised: 01/07/2012 Document Reviewed: 08/01/2011 [facility] Patient Information 2015 [place]. This information is not intended to replace advice given to you by your health care provider. Make sure you discuss any questions you have with your health care provider.

## 2014-12-31 ENCOUNTER — Telehealth: Payer: Self-pay

## 2014-12-31 NOTE — Telephone Encounter (Signed)
Attempted to contact patient. No answer. Left message stating we are calling with results, please call back and leave message stating whether it is OK or not to leave detailed message with results over voicemail.

## 2014-12-31 NOTE — Telephone Encounter (Signed)
Patient called and left message stating sorry she missed our call, she's just trying to call back. Patient states if she doesn't answer we can leave results on her voicemail. Called patient, no answer- left message stating we are calling you back and to let you know that your biopsy came back fine but you will need a follow up pap smear in one year. Also stated that if she has any questions or concerns she may call us back at the clinics

## 2014-12-31 NOTE — Telephone Encounter (Signed)
-----   Message from Delbert PhenixLinda M Barefoot, NP sent at 12/30/2014  7:45 PM EST ----- Please advise patient she will need repeat pap in one year, thank you, Bonita QuinLinda

## 2015-01-03 ENCOUNTER — Encounter: Payer: Self-pay | Admitting: *Deleted

## 2015-01-26 ENCOUNTER — Emergency Department (HOSPITAL_COMMUNITY)
Admission: EM | Admit: 2015-01-26 | Discharge: 2015-01-26 | Disposition: A | Discharge status: Home / Self Care | Payer: Medicaid Other | Source: Home / Self Care | Attending: Family Medicine | Admitting: Family Medicine

## 2015-01-26 ENCOUNTER — Encounter (HOSPITAL_COMMUNITY): Payer: Self-pay | Admitting: *Deleted

## 2015-01-26 DIAGNOSIS — R0982 Postnasal drip: Secondary | ICD-10-CM

## 2015-01-26 DIAGNOSIS — R110 Nausea: Secondary | ICD-10-CM

## 2015-01-26 DIAGNOSIS — J301 Allergic rhinitis due to pollen: Secondary | ICD-10-CM

## 2015-01-26 MED ORDER — PROMETHAZINE HCL 25 MG PO TABS: 25.0000 mg | ORAL_TABLET | Freq: Three times a day (TID) | ORAL | Status: DC | PRN

## 2015-01-26 NOTE — ED Notes (Signed)
Pt  Reports  Symptoms  Of  Nasal  Congestion/  stuffyness     With  Sinus  Drainage        Also reports  Symptoms  Of  Nausea  And a  Headache      As  Well      Pt  Sitting upright on  The  Exam table  Appearing in no  Severe    Acute  Distress

## 2015-01-26 NOTE — ED Provider Notes (Signed)
CSN: [id number]     Arrival date & time 01/26/15  1449 History   First MD Initiated Contact with Patient 01/26/15 1522     Chief Complaint  Patient presents with  . URI   (Consider location/radiation/quality/duration/timing/severity/associated sxs/prior Treatment) HPI Comments: 29 year old female complaining of PND, nausea, itchy throat, headache for one week. She denies fever. She obtain some Zyrtec yesterday he has taken one tablet.   Past Medical History  Diagnosis Date  . No pertinent past medical history   . Abnormal Pap smear   . Anemia   . Anxiety     worse when on meds   Past Surgical History  Procedure Laterality Date  . Cesarean section     Family History  Problem Relation Age of Onset  . Alcohol abuse Neg Hx   . Arthritis Neg Hx   . Asthma Neg Hx   . Birth defects Neg Hx   . Cancer Neg Hx   . COPD Neg Hx   . Depression Neg Hx   . Diabetes Neg Hx   . Drug abuse Neg Hx   . Early death Neg Hx   . Hearing loss Neg Hx   . Heart disease Neg Hx   . Hyperlipidemia Neg Hx   . Hypertension Neg Hx   . Kidney disease Neg Hx   . Learning disabilities Neg Hx   . Mental illness Neg Hx   . Mental retardation Neg Hx   . Miscarriages / Stillbirths Neg Hx   . Stroke Neg Hx   . Vision loss Neg Hx    History  Substance Use Topics  . Smoking status: Never Smoker   . Smokeless tobacco: Never Used  . Alcohol Use: Yes     Comment: occ   OB History    Gravida Para Term Preterm AB TAB SAB Ectopic Multiple Living   1 1 1       1      Review of Systems  Constitutional: Positive for activity change and appetite change. Negative for fever.  HENT: Positive for congestion, postnasal drip, rhinorrhea and sore throat. Negative for ear pain.   Respiratory: Negative.   Gastrointestinal: Positive for nausea. Negative for vomiting and abdominal pain.  Genitourinary: Negative.   Neurological: Negative.     Allergies  Review of patient's allergies indicates no known  allergies.  Home Medications   Prior to Admission medications   Medication Sig Start Date End Date Taking? Authorizing Provider  BIOTIN 5000 PO Take 1 capsule by mouth daily.    Historical Provider, MD  Cyanocobalamin (B-12) 5000 MCG CAPS Take 1 capsule by mouth daily.     Historical Provider, MD  Ferrous Sulfate (IRON) 142 (45 FE) MG TBCR Take 1 tablet by mouth daily.    Historical Provider, MD  ibuprofen (ADVIL,MOTRIN) 200 MG tablet Take 400 mg by mouth every 6 (six) hours as needed for moderate pain.     Historical Provider, MD  promethazine (PHENERGAN) 25 MG tablet Take 1 tablet (25 mg total) by mouth every 6 (six) hours as needed for nausea. 12/29/14   Hayden Rasmussenavid Jaclynn Laumann, NP  Tioconazole (MONISTAT 1 VA) Place 1 each vaginally daily as needed (for yeast infection symptoms.).    Historical Provider, MD   BP 103/57 mmHg  Pulse 72  Temp(Src) 98.5 F (36.9 C) (Oral)  Resp 16  SpO2 99%  LMP 01/12/2015 Physical Exam  Constitutional: She is oriented to person, place, and time. She appears well-developed and well-nourished. No distress.  HENT:  Mouth/Throat: No oropharyngeal exudate.  Bilateral TMs are normal Oropharynx with minor erythema, cobblestoning and clear PND  Eyes: Conjunctivae and EOM are normal.  Neck: Normal range of motion. Neck supple.  Cardiovascular: Normal rate, regular rhythm and normal heart sounds.   Pulmonary/Chest: Effort normal and breath sounds normal. No respiratory distress. She has no wheezes. She has no rales.  Lymphadenopathy:    She has no cervical adenopathy.  Neurological: She is alert and oriented to person, place, and time. No cranial nerve deficit. She exhibits normal muscle tone.  Skin: Skin is warm and dry.  Psychiatric: She has a normal mood and affect.  Nursing note and vitals reviewed.   ED Course  Procedures (including critical care time) Labs Review Labs Reviewed - No data to display  Imaging Review No results found.   MDM   1. Allergic  rhinitis due to pollen   2. Nausea   3. PND (post-nasal drip)    Zyrtec, lots of saline nasal spray, Flonase or Rhinocort nasal sprays Sudafed for congestion as needed. Lots of fluids   Phenergan prn nausea   Hayden Rasmussen, NP 01/26/15 1546

## 2015-01-26 NOTE — Discharge Instructions (Signed)
Allergic Rhinitis Zyrtec, lots of saline nasal spray, Flonase or Rhinocort nasal sprays Sudafed for congestion as needed. Lots of fluids Allergic rhinitis is when the mucous membranes in the nose respond to allergens. Allergens are particles in the air that cause your body to have an allergic reaction. This causes you to release allergic antibodies. Through a chain of events, these eventually cause you to release histamine into the blood stream. Although meant to protect the body, it is this release of histamine that causes your discomfort, such as frequent sneezing, congestion, and an itchy, runny nose.  CAUSES  Seasonal allergic rhinitis (hay fever) is caused by pollen allergens that may come from grasses, trees, and weeds. Year-round allergic rhinitis (perennial allergic rhinitis) is caused by allergens such as house dust mites, pet dander, and mold spores.  SYMPTOMS   Nasal stuffiness (congestion).  Itchy, runny nose with sneezing and tearing of the eyes. DIAGNOSIS  Your health care provider can help you determine the allergen or allergens that trigger your symptoms. If you and your health care provider are unable to determine the allergen, skin or blood testing may be used. TREATMENT  Allergic rhinitis does not have a cure, but it can be controlled by:  Medicines and allergy shots (immunotherapy).  Avoiding the allergen. Hay fever may often be treated with antihistamines in pill or nasal spray forms. Antihistamines block the effects of histamine. There are over-the-counter medicines that may help with nasal congestion and swelling around the eyes. Check with your health care provider before taking or giving this medicine.  If avoiding the allergen or the medicine prescribed do not work, there are many new medicines your health care provider can prescribe. Stronger medicine may be used if initial measures are  ineffective. Desensitizing injections can be used if medicine and avoidance does not work. Desensitization is when a patient is given ongoing shots until the body becomes less sensitive to the allergen. Make sure you follow up with your health care provider if problems continue. HOME CARE INSTRUCTIONS It is not possible to completely avoid allergens, but you can reduce your symptoms by taking steps to limit your exposure to them. It helps to know exactly what you are allergic to so that you can avoid your specific triggers. SEEK MEDICAL CARE IF:   You have a fever.  You develop a cough that does not stop easily (persistent).  You have shortness of breath.  You start wheezing.  Symptoms interfere with normal daily activities. Document Released: 07/10/2001 Document Revised: 10/20/2013 Document Reviewed: 06/22/2013 [facility] Patient Information 2015 [employer], [place]. This information is not intended to replace advice given to you by your health care provider. Make sure you discuss any questions you have with your health care provider.

## 2015-01-31 ENCOUNTER — Ambulatory Visit: Payer: Medicaid Other | Admitting: Obstetrics and Gynecology

## 2015-02-16 ENCOUNTER — Emergency Department (HOSPITAL_COMMUNITY)
Admission: EM | Admit: 2015-02-16 | Discharge: 2015-02-16 | Discharge status: Absent without leave | Payer: Medicaid Other

## 2015-02-17 ENCOUNTER — Encounter (HOSPITAL_COMMUNITY): Payer: Self-pay | Admitting: Emergency Medicine

## 2015-02-17 ENCOUNTER — Emergency Department (INDEPENDENT_AMBULATORY_CARE_PROVIDER_SITE_OTHER)
Admission: EM | Admit: 2015-02-17 | Discharge: 2015-02-17 | Disposition: A | Discharge status: Home / Self Care | Payer: Medicaid Other | Source: Home / Self Care | Attending: Family Medicine | Admitting: Family Medicine

## 2015-02-17 DIAGNOSIS — A084 Viral intestinal infection, unspecified: Secondary | ICD-10-CM | POA: Diagnosis not present

## 2015-02-17 MED ORDER — PROMETHAZINE HCL 25 MG PO TABS: 25.0000 mg | ORAL_TABLET | Freq: Three times a day (TID) | ORAL | Status: DC | PRN

## 2015-02-17 NOTE — Discharge Instructions (Signed)
You have a got infection called gastroenteritis. This is caused by a virus. This should get better in another 1-7 days. Please use the Phenergan for nausea. Please use Imodium sparingly. Please stay well hydrated and get plenty of rest. Please make sure to wash her hands frequently.

## 2015-02-17 NOTE — ED Provider Notes (Signed)
CSN: [id number]     Arrival date & time 02/17/15  1104 History   First MD Initiated Contact with Patient 02/17/15 1223     Chief Complaint  Patient presents with  . Emesis  . Diarrhea   (Consider location/radiation/quality/duration/timing/severity/associated sxs/prior Treatment) HPI  Emesis and diarrhea started at 17:00 yesterday. Non-bloody emesis. Water adn gingeraile w/o benefit. Unable to keep down any food or fluids. Emesis x5 since onset. Loose stools yesterday x3. No BM today. Has not taken anything for the symptoms. Co-worker w/ similar symptoms 4 days ago.  LMP 01/28/15. Not sexually active.   Past Medical History  Diagnosis Date  . No pertinent past medical history   . Abnormal Pap smear   . Anemia   . Anxiety     worse when on meds   Past Surgical History  Procedure Laterality Date  . Cesarean section     Family History  Problem Relation Age of Onset  . Alcohol abuse Neg Hx   . Arthritis Neg Hx   . Asthma Neg Hx   . Birth defects Neg Hx   . Cancer Neg Hx   . COPD Neg Hx   . Depression Neg Hx   . Diabetes Neg Hx   . Drug abuse Neg Hx   . Early death Neg Hx   . Hearing loss Neg Hx   . Heart disease Neg Hx   . Hyperlipidemia Neg Hx   . Hypertension Neg Hx   . Kidney disease Neg Hx   . Learning disabilities Neg Hx   . Mental illness Neg Hx   . Mental retardation Neg Hx   . Miscarriages / Stillbirths Neg Hx   . Stroke Neg Hx   . Vision loss Neg Hx    History  Substance Use Topics  . Smoking status: Never Smoker   . Smokeless tobacco: Never Used  . Alcohol Use: Yes     Comment: occ   OB History    Gravida Para Term Preterm AB TAB SAB Ectopic Multiple Living   1 1 1       1      Review of Systems Per HPI with all other pertinent systems negative.   Allergies  Review of patient's allergies indicates no known allergies.  Home Medications   Prior to Admission medications   Medication Sig Start Date End Date Taking? Authorizing Provider   promethazine (PHENERGAN) 25 MG tablet Take 1 tablet (25 mg total) by mouth every 8 (eight) hours as needed for nausea or vomiting. 02/17/15   Ozella Rocksavid J Merrell, MD   BP 107/73 mmHg  Pulse 62  Temp(Src) 98.2 F (36.8 C) (Oral)  Resp 12  SpO2 100%  LMP 01/28/2015 Physical Exam Physical Exam  Constitutional: oriented to person, place, and time. appears well-developed and well-nourished. No distress.  HENT:  Head: Normocephalic and atraumatic.  Eyes: EOMI. PERRL.  Neck: Normal range of motion.  Cardiovascular: RRR, no m/r/g, 2+ distal pulses,  Pulmonary/Chest: Effort normal and breath sounds normal. No respiratory distress.  Abdominal: Soft. Bowel sounds are normal. NonTTP, no distension.  Musculoskeletal: Normal range of motion. Non ttp, no effusion.  Neurological: alert and oriented to person, place, and time.  Skin: Skin is warm. No rash noted. non diaphoretic.  Psychiatric: normal mood and affect. behavior is normal. Judgment and thought content normal.   ED Course  Procedures (including critical care time) Labs Review Labs Reviewed - No data to display  Imaging Review No results found.  MDM   1. Viral gastroenteritis    Phenergan Precautions given and all questions answered  Shelly Flatten, MD Family Medicine 02/17/2015, 1:00 PM     Ozella Rocks, MD 02/17/15 1300

## 2015-02-17 NOTE — ED Notes (Signed)
Patient c/o vomiting and diarrhea onset yesterday evening. Patient reports she has not had any change in diet but has had a decreased appetite. She is lying on the exam table. Patient is in NAD.

## 2015-04-06 ENCOUNTER — Encounter (HOSPITAL_COMMUNITY): Payer: Self-pay | Admitting: Emergency Medicine

## 2015-04-06 ENCOUNTER — Emergency Department (HOSPITAL_COMMUNITY)
Admission: EM | Admit: 2015-04-06 | Discharge: 2015-04-06 | Disposition: A | Discharge status: Home / Self Care | Payer: Medicaid Other | Attending: Emergency Medicine | Admitting: Emergency Medicine

## 2015-04-06 DIAGNOSIS — S8991XA Unspecified injury of right lower leg, initial encounter: Secondary | ICD-10-CM | POA: Diagnosis not present

## 2015-04-06 DIAGNOSIS — S4991XA Unspecified injury of right shoulder and upper arm, initial encounter: Secondary | ICD-10-CM | POA: Insufficient documentation

## 2015-04-06 DIAGNOSIS — Z862 Personal history of diseases of the blood and blood-forming organs and certain disorders involving the immune mechanism: Secondary | ICD-10-CM | POA: Insufficient documentation

## 2015-04-06 DIAGNOSIS — Z8659 Personal history of other mental and behavioral disorders: Secondary | ICD-10-CM | POA: Insufficient documentation

## 2015-04-06 DIAGNOSIS — T148XXA Other injury of unspecified body region, initial encounter: Secondary | ICD-10-CM

## 2015-04-06 DIAGNOSIS — S29001A Unspecified injury of muscle and tendon of front wall of thorax, initial encounter: Secondary | ICD-10-CM | POA: Diagnosis present

## 2015-04-06 DIAGNOSIS — Y9389 Activity, other specified: Secondary | ICD-10-CM | POA: Insufficient documentation

## 2015-04-06 DIAGNOSIS — Y92410 Unspecified street and highway as the place of occurrence of the external cause: Secondary | ICD-10-CM | POA: Diagnosis not present

## 2015-04-06 DIAGNOSIS — Y998 Other external cause status: Secondary | ICD-10-CM | POA: Diagnosis not present

## 2015-04-06 DIAGNOSIS — T148 Other injury of unspecified body region: Secondary | ICD-10-CM | POA: Diagnosis not present

## 2015-04-06 DIAGNOSIS — S3992XA Unspecified injury of lower back, initial encounter: Secondary | ICD-10-CM | POA: Diagnosis not present

## 2015-04-06 DIAGNOSIS — S4992XA Unspecified injury of left shoulder and upper arm, initial encounter: Secondary | ICD-10-CM | POA: Diagnosis not present

## 2015-04-06 MED ORDER — CYCLOBENZAPRINE HCL 10 MG PO TABS: 10.0000 mg | ORAL_TABLET | Freq: Two times a day (BID) | ORAL | Status: DC | PRN

## 2015-04-06 MED ORDER — IBUPROFEN 800 MG PO TABS: 800.0000 mg | ORAL_TABLET | Freq: Three times a day (TID) | ORAL | Status: DC
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
## 2015-05-12 ENCOUNTER — Ambulatory Visit: Payer: Medicaid Other | Admitting: Obstetrics and Gynecology

## 2015-05-24 ENCOUNTER — Encounter (HOSPITAL_COMMUNITY): Payer: Self-pay | Admitting: Emergency Medicine

## 2015-05-24 ENCOUNTER — Emergency Department (HOSPITAL_COMMUNITY)
Admission: EM | Admit: 2015-05-24 | Discharge: 2015-05-24 | Disposition: A | Discharge status: Home / Self Care | Payer: Medicaid Other | Attending: Emergency Medicine | Admitting: Emergency Medicine

## 2015-05-24 DIAGNOSIS — Z79899 Other long term (current) drug therapy: Secondary | ICD-10-CM | POA: Diagnosis not present

## 2015-05-24 DIAGNOSIS — R112 Nausea with vomiting, unspecified: Secondary | ICD-10-CM

## 2015-05-24 DIAGNOSIS — Z3202 Encounter for pregnancy test, result negative: Secondary | ICD-10-CM | POA: Diagnosis not present

## 2015-05-24 DIAGNOSIS — R197 Diarrhea, unspecified: Secondary | ICD-10-CM | POA: Insufficient documentation

## 2015-05-24 DIAGNOSIS — R109 Unspecified abdominal pain: Secondary | ICD-10-CM | POA: Diagnosis not present

## 2015-05-24 LAB — COMPREHENSIVE METABOLIC PANEL
ALT: 22 U/L (ref 14–54)
AST: 27 U/L (ref 15–41)
Albumin: 3.9 g/dL (ref 3.5–5.0)
Alkaline Phosphatase: 36 U/L — ABNORMAL LOW (ref 38–126)
Anion gap: 7 (ref 5–15)
BUN: 10 mg/dL (ref 6–20)
CALCIUM: 9.4 mg/dL (ref 8.9–10.3)
CO2: 27 mmol/L (ref 22–32)
Chloride: 103 mmol/L (ref 101–111)
Creatinine, Ser: 0.82 mg/dL (ref 0.44–1.00)
GFR calc non Af Amer: >60 mL/min (ref >60)
Glucose, Bld: 90 mg/dL (ref 65–99)
Potassium: 3.5 mmol/L (ref 3.5–5.1)
Sodium: 137 mmol/L (ref 135–145)
Total Bilirubin: 0.4 mg/dL (ref 0.3–1.2)
Total Protein: 7.7 g/dL (ref 6.5–8.1)

## 2015-05-24 LAB — CBC WITH DIFFERENTIAL/PLATELET
BASOS ABS: 0 10*3/uL (ref 0.0–0.1)
BASOS PCT: 0 % (ref 0–1)
EOS ABS: 0.2 10*3/uL (ref 0.0–0.7)
EOS PCT: 3 % (ref 0–5)
HEMATOCRIT: 34.6 % — AB (ref 36.0–46.0)
Hemoglobin: 11.4 g/dL — ABNORMAL LOW (ref 12.0–15.0)
LYMPHS PCT: 35 % (ref 12–46)
Lymphs Abs: 2 10*3/uL (ref 0.7–4.0)
MCH: 27.7 pg (ref 26.0–34.0)
MCHC: 32.9 g/dL (ref 30.0–36.0)
MCV: 84 fL (ref 78.0–100.0)
MONOS PCT: 7 % (ref 3–12)
Monocytes Absolute: 0.4 10*3/uL (ref 0.1–1.0)
NEUTROS PCT: 55 % (ref 43–77)
Neutro Abs: 3.2 10*3/uL (ref 1.7–7.7)
PLATELETS: 311 10*3/uL (ref 150–400)
RBC: 4.12 MIL/uL (ref 3.87–5.11)
RDW: 13.5 % (ref 11.5–15.5)
WBC: 5.9 10*3/uL (ref 4.0–10.5)

## 2015-05-24 LAB — POC URINE PREG, ED: Preg Test, Ur: NEGATIVE

## 2015-05-24 LAB — LIPASE, BLOOD: Lipase: 17 U/L — ABNORMAL LOW (ref 22–51)

## 2015-05-24 MED ORDER — PROMETHAZINE HCL 25 MG/ML IJ SOLN
25.0000 mg | Freq: Once | INTRAMUSCULAR | Status: DC
  Filled 2015-05-24: qty 1

## 2015-05-24 MED ORDER — MORPHINE SULFATE 4 MG/ML IJ SOLN: 4.0000 mg | Freq: Once | INTRAMUSCULAR | Status: DC

## 2015-05-24 MED ORDER — SODIUM CHLORIDE 0.9 % IV BOLUS (SEPSIS)
1000.0000 mL | Freq: Once | INTRAVENOUS | Status: AC
  Administered 2015-05-24: 1000 mL via INTRAVENOUS

## 2015-05-24 MED ORDER — ONDANSETRON 8 MG PO TBDP
8.0000 mg | ORAL_TABLET | Freq: Once | ORAL | Status: AC
  Administered 2015-05-24: 8 mg via ORAL
  Filled 2015-05-24: qty 1

## 2015-05-24 MED ORDER — ONDANSETRON HCL 4 MG/2ML IJ SOLN: 4.0000 mg | Freq: Once | INTRAMUSCULAR | Status: DC

## 2015-05-24 MED ORDER — PROMETHAZINE HCL 25 MG PO TABS: 25.0000 mg | ORAL_TABLET | Freq: Three times a day (TID) | ORAL | Status: DC | PRN

## 2015-05-24 NOTE — Discharge Instructions (Signed)

## 2015-05-24 NOTE — ED Provider Notes (Signed)
CSN: [id number]     Arrival date & time 05/24/15  1056 History   First MD Initiated Contact with Patient 05/24/15 1103     Chief Complaint  Patient presents with  . Emesis  . Diarrhea  . Facial Pain     (Consider location/radiation/quality/duration/timing/severity/associated sxs/prior Treatment) HPI Comments: Patient presents to the emergency department with chief complaint of nausea, vomiting, diarrhea. Patient states symptoms started last night around 1 AM. She states that she has vomited approximately 5 times today. She states that she has had some mild abdominal pain, which she attributes to vomiting. She denies any hematemesis, melena, or hematochezia. She states that she has been around several coworkers, who have also had nausea, vomiting, diarrhea. She states that her last oral intake was yesterday, and that she ate [place] food. She has been able to keep down a small amount of ginger ale today. She is here requesting nausea medication. She does not want anything for pain because she has to drive home. She denies any prior abdominal surgeries with the exception of cesarean section.  The history is provided by the patient. No language interpreter was used.    Past Medical History  Diagnosis Date  . No pertinent past medical history   . Abnormal Pap smear   . Anemia   . Anxiety     worse when on meds   Past Surgical History  Procedure Laterality Date  . Cesarean section     Family History  Problem Relation Age of Onset  . Alcohol abuse Neg Hx   . Arthritis Neg Hx   . Asthma Neg Hx   . Birth defects Neg Hx   . Cancer Neg Hx   . COPD Neg Hx   . Depression Neg Hx   . Diabetes Neg Hx   . Drug abuse Neg Hx   . Early death Neg Hx   . Hearing loss Neg Hx   . Heart disease Neg Hx   . Hyperlipidemia Neg Hx   . Hypertension Neg Hx   . Kidney disease Neg Hx   . Learning disabilities Neg Hx   . Mental illness Neg Hx   . Mental retardation Neg Hx   . Miscarriages /  Stillbirths Neg Hx   . Stroke Neg Hx   . Vision loss Neg Hx    History  Substance Use Topics  . Smoking status: Never Smoker   . Smokeless tobacco: Never Used  . Alcohol Use: Yes     Comment: occ   OB History    Gravida Para Term Preterm AB TAB SAB Ectopic Multiple Living   Review of Systems  Constitutional: Negative for fever and chills.  Respiratory: Negative for shortness of breath.   Cardiovascular: Negative for chest pain.  Gastrointestinal: Positive for nausea, vomiting and diarrhea. Negative for constipation.  Genitourinary: Negative for dysuria.  All other systems reviewed and are negative.     Allergies  Review of patient's allergies indicates no known allergies.  Home Medications   Prior to Admission medications   Medication Sig Start Date End Date Taking? Authorizing Provider  cyclobenzaprine (FLEXERIL) 10 MG tablet Take 1 tablet (10 mg total) by mouth 2 (two) times daily as needed for muscle spasms. Patient not taking: Reported on 05/24/2015 04/06/15   Elpidio Anis, PA-C  ibuprofen (ADVIL,MOTRIN) 800 MG tablet Take 1 tablet (800 mg total) by mouth 3 (three) times daily. Patient  not taking: Reported on 05/24/2015 04/06/15   Elpidio Anis, PA-C  promethazine (PHENERGAN) 25 MG tablet Take 1 tablet (25 mg total) by mouth every 8 (eight) hours as needed for nausea or vomiting. Patient not taking: Reported on 05/24/2015 02/17/15   Ozella Rocks, MD   BP 101/77 mmHg  Pulse 68  Temp(Src) 97.8 F (36.6 C) (Oral)  Resp 16  SpO2 99% Physical Exam  Constitutional: She is oriented to person, place, and time. She appears well-developed and well-nourished.  HENT:  Head: Normocephalic and atraumatic.  Eyes: Conjunctivae and EOM are normal. Pupils are equal, round, and reactive to light.  Neck: Normal range of motion. Neck supple.  Cardiovascular: Normal rate and regular rhythm.  Exam reveals no gallop and no friction rub.   No murmur  heard. Pulmonary/Chest: Effort normal and breath sounds normal. No respiratory distress. She has no wheezes. She has no rales. She exhibits no tenderness.  Abdominal: Soft. Bowel sounds are normal. She exhibits no distension and no mass. There is no tenderness. There is no rebound and no guarding.  No focal abdominal tenderness, no RLQ tenderness or pain at McBurney's point, no RUQ tenderness or Murphy's sign, no left-sided abdominal tenderness, no fluid wave, or signs of peritonitis   Musculoskeletal: Normal range of motion. She exhibits no edema or tenderness.  Neurological: She is alert and oriented to person, place, and time.  Skin: Skin is warm and dry.  Psychiatric: She has a normal mood and affect. Her behavior is normal. Judgment and thought content normal.  Nursing note and vitals reviewed.   ED Course  Procedures (including critical care time)  Imaging Review No results found.   EKG Interpretation None      MDM   Final diagnoses:  Nausea vomiting and diarrhea    Patient with nausea, vomiting, diarrhea. She has been around sick coworkers. Labs are reassuring. She is unable to wait for urinalysis, but denies any urinary symptoms. Will treat with Phenergan. Recommend continuing fluids and rest at home. Return precautions discussed. Patient's abdomen is soft and nontender. Patient is stable and ready for discharge to home.    Roxy Horseman, PA-C 05/24/15 1244  Doug Sou, MD 05/24/15 [phone]

## 2015-05-24 NOTE — ED Notes (Signed)
Pt reports n/v/d since last night. Has thrown up 5x today. Also reports generalized facial pressure for past 5 days.

## 2015-07-14 ENCOUNTER — Encounter (HOSPITAL_COMMUNITY): Payer: Self-pay | Admitting: Emergency Medicine

## 2015-07-14 ENCOUNTER — Emergency Department (HOSPITAL_COMMUNITY)
Admission: EM | Admit: 2015-07-14 | Discharge: 2015-07-14 | Disposition: A | Discharge status: Home / Self Care | Payer: Medicaid Other | Source: Home / Self Care | Attending: Family Medicine | Admitting: Family Medicine

## 2015-07-14 DIAGNOSIS — R112 Nausea with vomiting, unspecified: Secondary | ICD-10-CM | POA: Diagnosis not present

## 2015-07-14 MED ORDER — PROMETHAZINE HCL 25 MG PO TABS
25.0000 mg | ORAL_TABLET | Freq: Four times a day (QID) | ORAL | Status: DC | PRN
Start: 2015-07-14 — End: 2015-08-05

## 2015-07-14 NOTE — ED Provider Notes (Signed)
CSN: [id number]     Arrival date & time 07/14/15  1431 History   First MD Initiated Contact with Patient 07/14/15 1652     Chief Complaint  Patient presents with  . Nausea  . Emesis   (Consider location/radiation/quality/duration/timing/severity/associated sxs/prior Treatment) HPI Comments: 29 year old female with nausea and vomiting for one day. She states it is yellow, usual stomach contents. No diarrhea or constipation. Denies fever. She does have mild epigastric discomfort. Reviewing the patient's record she has been seen at the urgent care and ED and PCP several times for more than 2 years for the same complaints.  The history is provided by the patient.    Past Medical History  Diagnosis Date  . No pertinent past medical history   . Abnormal Pap smear   . Anemia   . Anxiety     worse when on meds   Past Surgical History  Procedure Laterality Date  . Cesarean section     Family History  Problem Relation Age of Onset  . Alcohol abuse Neg Hx   . Arthritis Neg Hx   . Asthma Neg Hx   . Birth defects Neg Hx   . Cancer Neg Hx   . COPD Neg Hx   . Depression Neg Hx   . Diabetes Neg Hx   . Drug abuse Neg Hx   . Early death Neg Hx   . Hearing loss Neg Hx   . Heart disease Neg Hx   . Hyperlipidemia Neg Hx   . Hypertension Neg Hx   . Kidney disease Neg Hx   . Learning disabilities Neg Hx   . Mental illness Neg Hx   . Mental retardation Neg Hx   . Miscarriages / Stillbirths Neg Hx   . Stroke Neg Hx   . Vision loss Neg Hx    Social History  Substance Use Topics  . Smoking status: Never Smoker   . Smokeless tobacco: Never Used  . Alcohol Use: Yes     Comment: occ   OB History    Gravida Para Term Preterm AB TAB SAB Ectopic Multiple Living   Review of Systems  Constitutional: Positive for activity change. Negative for fever and fatigue.  Respiratory: Negative.   Gastrointestinal: Positive for nausea, vomiting and abdominal pain. Negative for  constipation and abdominal distention.  Genitourinary: Negative.   Neurological: Negative.   All other systems reviewed and are negative.   Allergies  Review of patient's allergies indicates no known allergies.  Home Medications   Prior to Admission medications   Medication Sig Start Date End Date Taking? Authorizing Provider  promethazine (PHENERGAN) 25 MG tablet Take 1 tablet (25 mg total) by mouth every 6 (six) hours as needed for nausea. 07/14/15   Hayden Rasmussen, NP   Meds Ordered and Administered this Visit  Medications - No data to display  BP 110/73 mmHg  Pulse 71  Temp(Src) 98.4 F (36.9 C) (Oral)  Resp 18  SpO2 98%  LMP 07/01/2015 No data found.   Physical Exam  Constitutional: She is oriented to person, place, and time. She appears well-developed and well-nourished. No distress.  Neck: Normal range of motion. Neck supple.  Cardiovascular: Normal rate.   Pulmonary/Chest: Effort normal. No respiratory distress.  Abdominal: Soft. Bowel sounds are normal. She exhibits no distension and no mass. There is no rebound and no guarding.  Mild tenderness in the epigastrium. The upper  left quadrant, left lower quadrant and right lower quadrant percusses flat to dull. The epigastrium percusses as tympanic. No distention.  Musculoskeletal: Normal range of motion. She exhibits no edema.  Neurological: She is alert and oriented to person, place, and time.  Skin: Skin is warm and dry.  Psychiatric: She has a normal mood and affect.  Nursing note and vitals reviewed.   ED Course  Procedures (including critical care time)  Labs Review Labs Reviewed - No data to display  Imaging Review No results found.   Visual Acuity Review  Right Eye Distance:   Left Eye Distance:   Bilateral Distance:    Right Eye Near:   Left Eye Near:    Bilateral Near:         MDM   1. Non-intractable vomiting with nausea, vomiting of unspecified type    Patient with recurrent bouts of  nausea and vomiting over 2 years. She has seen her PCP and wants her gastroenterologist. The patient states has been no specific diagnosis. It is recommended that she call her PCP for an appointment to obtain another referral to a gastroenterologist since she continues to have same symptoms. Etiology of her nausea and vomiting uncertain.  Abdominal exam suggest patient is constipated. It is also suggested that she take MiraLAX as directed for relief of constipation. Start after her nausea and vomiting has abated.   Hayden Rasmussen, NP 07/14/15 1728

## 2015-07-14 NOTE — ED Notes (Signed)
Patient complains of nausea and vomiting since 07/13/15.  Reports 3 episodes of vomiting today.  No pain, predominantly nauseated.  No one else is sick at home.  lmp is not late.

## 2015-07-14 NOTE — Discharge Instructions (Signed)

## 2015-08-05 ENCOUNTER — Encounter (HOSPITAL_COMMUNITY): Payer: Self-pay | Admitting: *Deleted

## 2015-08-05 ENCOUNTER — Inpatient Hospital Stay (HOSPITAL_COMMUNITY)
Admission: AD | Admit: 2015-08-05 | Discharge: 2015-08-05 | Disposition: A | Discharge status: Home / Self Care | Payer: Medicaid Other | Source: Ambulatory Visit | Attending: Obstetrics & Gynecology | Admitting: Obstetrics & Gynecology

## 2015-08-05 DIAGNOSIS — N3090 Cystitis, unspecified without hematuria: Secondary | ICD-10-CM | POA: Diagnosis not present

## 2015-08-05 DIAGNOSIS — R300 Dysuria: Secondary | ICD-10-CM | POA: Diagnosis present

## 2015-08-05 DIAGNOSIS — R110 Nausea: Secondary | ICD-10-CM | POA: Diagnosis present

## 2015-08-05 DIAGNOSIS — B379 Candidiasis, unspecified: Secondary | ICD-10-CM | POA: Diagnosis not present

## 2015-08-05 DIAGNOSIS — Z113 Encounter for screening for infections with a predominantly sexual mode of transmission: Secondary | ICD-10-CM

## 2015-08-05 DIAGNOSIS — F419 Anxiety disorder, unspecified: Secondary | ICD-10-CM | POA: Diagnosis not present

## 2015-08-05 HISTORY — DX: Unspecified abnormal cytological findings in specimens from vagina: R87.629

## 2015-08-05 LAB — CBC
HEMATOCRIT: 33.1 % — AB (ref 36.0–46.0)
HEMOGLOBIN: 11.3 g/dL — AB (ref 12.0–15.0)
MCH: 28.8 pg (ref 26.0–34.0)
MCHC: 34.1 g/dL (ref 30.0–36.0)
MCV: 84.4 fL (ref 78.0–100.0)
Platelets: 358 10*3/uL (ref 150–400)
RBC: 3.92 MIL/uL (ref 3.87–5.11)
RDW: 13.9 % (ref 11.5–15.5)
WBC: 5.9 10*3/uL (ref 4.0–10.5)

## 2015-08-05 LAB — URINALYSIS, ROUTINE W REFLEX MICROSCOPIC
Bilirubin Urine: NEGATIVE
Glucose, UA: NEGATIVE mg/dL
Ketones, ur: NEGATIVE mg/dL
NITRITE: NEGATIVE
Protein, ur: NEGATIVE mg/dL
Specific Gravity, Urine: 1.01 (ref 1.005–1.030)
UROBILINOGEN UA: 0.2 mg/dL (ref 0.0–1.0)
pH: 7 (ref 5.0–8.0)

## 2015-08-05 LAB — URINE MICROSCOPIC-ADD ON

## 2015-08-05 LAB — WET PREP, GENITAL
Clue Cells Wet Prep HPF POC: NONE SEEN
Trich, Wet Prep: NONE SEEN
Yeast Wet Prep HPF POC: NONE SEEN

## 2015-08-05 LAB — POCT PREGNANCY, URINE: Preg Test, Ur: NEGATIVE

## 2015-08-05 MED ORDER — PROMETHAZINE HCL 25 MG PO TABS: 25.0000 mg | ORAL_TABLET | Freq: Four times a day (QID) | ORAL | Status: DC | PRN

## 2015-08-05 MED ORDER — SULFAMETHOXAZOLE-TRIMETHOPRIM 800-160 MG PO TABS: 1.0000 | ORAL_TABLET | Freq: Two times a day (BID) | ORAL | Status: AC

## 2015-08-05 MED ORDER — FLUCONAZOLE 150 MG PO TABS: 150.0000 mg | ORAL_TABLET | Freq: Once | ORAL | Status: DC

## 2015-08-05 MED ORDER — PHENAZOPYRIDINE HCL 200 MG PO TABS: 200.0000 mg | ORAL_TABLET | Freq: Three times a day (TID) | ORAL | Status: DC

## 2015-08-05 MED ORDER — ONDANSETRON 8 MG PO TBDP
8.0000 mg | ORAL_TABLET | Freq: Once | ORAL | Status: AC
  Administered 2015-08-05: 8 mg via ORAL
  Filled 2015-08-05: qty 2

## 2015-08-05 NOTE — MAU Note (Signed)
Frequency and urgency with urination, painful pressure at the end of urination; started early in the week.  Has been nauseated

## 2015-08-05 NOTE — Discharge Instructions (Signed)
Monilial Vaginitis Vaginitis in a soreness, swelling and redness (inflammation) of the vagina and vulva. Monilial vaginitis is not a sexually transmitted infection. CAUSES  Yeast vaginitis is caused by yeast (candida) that is normally found in your vagina. With a yeast infection, the candida has overgrown in number to a point that upsets the chemical balance. SYMPTOMS   White, thick vaginal discharge.  Swelling, itching, redness and irritation of the vagina and possibly the lips of the vagina (vulva).  Burning or painful urination.  Painful intercourse. DIAGNOSIS  Things that may contribute to monilial vaginitis are:  Postmenopausal and virginal states.  Pregnancy.  Infections.  Being tired, sick or stressed, especially if you had monilial vaginitis in the past.  Diabetes. Good control will help lower the chance.  Birth control pills.  Tight fitting garments.  Using bubble bath, feminine sprays, douches or deodorant tampons.  Taking certain medications that kill germs (antibiotics).  Sporadic recurrence can occur if you become ill. TREATMENT  Your caregiver will give you medication.  There are several kinds of anti monilial vaginal creams and suppositories specific for monilial vaginitis. For recurrent yeast infections, use a suppository or cream in the vagina 2 times a week, or as directed.  Anti-monilial or steroid cream for the itching or irritation of the vulva may also be used. Get your caregiver's permission.  Painting the vagina with methylene blue solution may help if the monilial cream does not work.  Eating yogurt may help prevent monilial vaginitis. HOME CARE INSTRUCTIONS   Finish all medication as prescribed.  Do not have sex until treatment is completed or after your caregiver tells you it is okay.  Take warm sitz baths.  Do not douche.  Do not use tampons, especially scented ones.  Wear cotton underwear.  Avoid tight pants and panty  hose.  Tell your sexual partner that you have a yeast infection. They should go to their caregiver if they have symptoms such as mild rash or itching.  Your sexual partner should be treated as well if your infection is difficult to eliminate.  Practice safer sex. Use condoms.  Some vaginal medications cause latex condoms to fail. Vaginal medications that harm condoms are:  Cleocin cream.  Butoconazole (Femstat).  Terconazole (Terazol) vaginal suppository.  Miconazole (Monistat) (may be purchased over the counter). SEEK MEDICAL CARE IF:   You have a temperature by mouth above 102 F (38.9 C).  The infection is getting worse after 2 days of treatment.  The infection is not getting better after 3 days of treatment.  You develop blisters in or around your vagina.  You develop vaginal bleeding, and it is not your menstrual period.  You have pain when you urinate.  You develop intestinal problems.  You have pain with sexual intercourse.   This information is not intended to replace advice given to you by your health care provider. Make sure you discuss any questions you have with your health care provider.   Document Released: 07/25/2005 Document Revised: 01/07/2012 Document Reviewed: 04/18/2015 Elsevier Interactive Patient Education 2016 Else[employer] Inc[employer] Urinary Tract Infection Urinary tract infections (UTIs) can develop anywhere along your urinary tract. Your urinary tract is your body's drainage system for removing wastes and extra water. Your urinary tract includes two kidneys, two ureters, a bladder, and a urethra. Your kidneys are a pair of bean-shaped organs. Each kidney is about the size of your fist. They are located below your ribs, one on each side of your spine. CAUSES Infections are   caused by microbes, which are microscopic organisms, including fungi, viruses, and bacteria. These organisms are so small that they can only be seen through a microscope. Bacteria are  the microbes that most commonly cause UTIs. SYMPTOMS  Symptoms of UTIs may vary by age and gender of the patient and by the location of the infection. Symptoms in young women typically include a frequent and intense urge to urinate and a painful, burning feeling in the bladder or urethra during urination. Older women and men are more likely to be tired, shaky, and weak and have muscle aches and abdominal pain. A fever may mean the infection is in your kidneys. Other symptoms of a kidney infection include pain in your back or sides below the ribs, nausea, and vomiting. DIAGNOSIS To diagnose a UTI, your caregiver will ask you about your symptoms. Your caregiver will also ask you to provide a urine sample. The urine sample will be tested for bacteria and white blood cells. White blood cells are made by your body to help fight infection. TREATMENT  Typically, UTIs can be treated with medication. Because most UTIs are caused by a bacterial infection, they usually can be treated with the use of antibiotics. The choice of antibiotic and length of treatment depend on your symptoms and the type of bacteria causing your infection. HOME CARE INSTRUCTIONS  If you were prescribed antibiotics, take them exactly as your caregiver instructs you. Finish the medication even if you feel better after you have only taken some of the medication.  Drink enough water and fluids to keep your urine clear or pale yellow.  Avoid caffeine, tea, and carbonated beverages. They tend to irritate your bladder.  Empty your bladder often. Avoid holding urine for long periods of time.  Empty your bladder before and after sexual intercourse.  After a bowel movement, women should cleanse from front to back. Use each tissue only once. SEEK MEDICAL CARE IF:   You have back pain.  You develop a fever.  Your symptoms do not begin to resolve within 3 days. SEEK IMMEDIATE MEDICAL CARE IF:   You have severe back pain or lower  abdominal pain.  You develop chills.  You have nausea or vomiting.  You have continued burning or discomfort with urination. MAKE SURE YOU:   Understand these instructions.  Will watch your condition.  Will get help right away if you are not doing well or get worse.   This information is not intended to replace advice given to you by your health care provider. Make sure you discuss any questions you have with your health care provider.   Document Released: 07/25/2005 Document Revised: 07/06/2015 Document Reviewed: 11/23/2011 Elsevier Interactive Patient Education 2016 Elsevier Inc.  

## 2015-08-05 NOTE — MAU Provider Note (Signed)
History     CSN: [id number]  Arrival date and time: 08/05/15 1326   First Provider Initiated Contact with Patient 08/05/15 1432         Chief Complaint  Patient presents with  . Dysuria  . Nausea   HPI  Kellie Davila is a 29 y.o. female who presents for dysuria, nausea, & STD screening.  Complains of dysuria & increased urinary frequency since Monday. Describes as burning at the end of her stream & lower abdominal pressure/pain while voiding.  Denies hematuria, back pain, or fever.  Nausea since last night, no vomiting.  Denies diarrhea or constipation.  Vaginal itching & discharge since Monday. Doesn't know what discharge looks like, no odor.  LMP 10/2; states stopped bleeding 2 days ago.  Sexually active, uses condoms. Wants STD testing.    OB History    Gravida Para Term Preterm AB TAB SAB Ectopic Multiple Living   Past Medical History  Diagnosis Date  . No pertinent past medical history   . Abnormal Pap smear   . Anemia   . Anxiety     worse when on meds  . Vaginal Pap smear, abnormal     did bx, ok since    Past Surgical History  Procedure Laterality Date  . Cesarean section      Family History  Problem Relation Age of Onset  . Alcohol abuse Neg Hx   . Arthritis Neg Hx   . Asthma Neg Hx   . Birth defects Neg Hx   . Cancer Neg Hx   . COPD Neg Hx   . Depression Neg Hx   . Diabetes Neg Hx   . Drug abuse Neg Hx   . Early death Neg Hx   . Hearing loss Neg Hx   . Heart disease Neg Hx   . Hyperlipidemia Neg Hx   . Hypertension Neg Hx   . Kidney disease Neg Hx   . Learning disabilities Neg Hx   . Mental illness Neg Hx   . Mental retardation Neg Hx   . Miscarriages / Stillbirths Neg Hx   . Stroke Neg Hx   . Vision loss Neg Hx     Social History  Substance Use Topics  . Smoking status: Never Smoker   . Smokeless tobacco: Never Used  . Alcohol Use: No    Allergies: No Known Allergies  Prescriptions prior to admission   Medication Sig Dispense Refill Last Dose  . ibuprofen (ADVIL,MOTRIN) 200 MG tablet Take 400 mg by mouth every 6 (six) hours as needed for moderate pain.   08/05/2015 at Unknown time  . promethazine (PHENERGAN) 25 MG tablet Take 1 tablet (25 mg total) by mouth every 6 (six) hours as needed for nausea. (Patient not taking: Reported on 08/05/2015) 15 tablet 0 Not Taking at Unknown time    Review of Systems  Constitutional: Negative.  Negative for fever and chills.  Gastrointestinal: Positive for nausea. Negative for heartburn, vomiting, abdominal pain, diarrhea and constipation.  Genitourinary: Positive for dysuria and frequency. Negative for hematuria and flank pain.   Physical Exam   Blood pressure 107/67, pulse 83, temperature 98.1 F (36.7 C), temperature source Oral, resp. rate 16, weight 165 lb 6.4 oz (75.025 kg), last menstrual period 07/31/2015.  Physical Exam  Nursing note and vitals reviewed. Constitutional: She is oriented to person, place, and time. She appears well-developed and well-nourished. No  distress.  HENT:  Head: Normocephalic and atraumatic.  Eyes: Conjunctivae are normal. Right eye exhibits no discharge. Left eye exhibits no discharge. No scleral icterus.  Neck: Normal range of motion.  Cardiovascular: Normal rate, regular rhythm and normal heart sounds.   No murmur heard. Respiratory: Effort normal and breath sounds normal. No respiratory distress. She has no wheezes.  GI: Soft. Bowel sounds are normal. She exhibits no distension. There is no tenderness. There is no CVA tenderness.  Genitourinary: Uterus normal. Cervix exhibits discharge. Cervix exhibits no motion tenderness and no friability. There is erythema (mild erythema bilateral labia majora) in the vagina.  Small amount of [place] discharge - appropriate for end of menses.  Minimal amount of white discharge adherent to vaginal walls  Neurological: She is alert and oriented to person, place, and time.  Skin:  Skin is warm and dry. She is not diaphoretic.  Psychiatric: She has a normal mood and affect. Her behavior is normal. Judgment and thought content normal.    MAU Course  Procedures Results for orders placed or performed during the hospital encounter of 08/05/15 (from the past 24 hour(s))  Urinalysis, Routine w reflex microscopic (not at [facility])     Status: Abnormal   Collection Time: 08/05/15  1:59 PM  Result Value Ref Range   Color, Urine YELLOW YELLOW   APPearance CLOUDY (A) CLEAR   Specific Gravity, Urine 1.010 1.005 - 1.030   pH 7.0 5.0 - 8.0   Glucose, UA NEGATIVE NEGATIVE mg/dL   Hgb urine dipstick TRACE (A) NEGATIVE   Bilirubin Urine NEGATIVE NEGATIVE   Ketones, ur NEGATIVE NEGATIVE mg/dL   Protein, ur NEGATIVE NEGATIVE mg/dL   Urobilinogen, UA 0.2 0.0 - 1.0 mg/dL   Nitrite NEGATIVE NEGATIVE   Leukocytes, UA MODERATE (A) NEGATIVE  Urine microscopic-add on     Status: Abnormal   Collection Time: 08/05/15  1:59 PM  Result Value Ref Range   Squamous Epithelial / LPF FEW (A) RARE   WBC, UA 11-20 <3 WBC/hpf   RBC / HPF 0-2 <3 RBC/hpf   Bacteria, UA FEW (A) RARE  Pregnancy, urine POC     Status: None   Collection Time: 08/05/15  2:00 PM  Result Value Ref Range   Preg Test, Ur NEGATIVE NEGATIVE  CBC     Status: Abnormal   Collection Time: 08/05/15  3:23 PM  Result Value Ref Range   WBC 5.9 4.0 - 10.5 K/uL   RBC 3.92 3.87 - 5.11 MIL/uL   Hemoglobin 11.3 (L) 12.0 - 15.0 g/dL   HCT 16.1 (L) 09.6 - 04.5 %   MCV 84.4 78.0 - 100.0 fL   MCH 28.8 26.0 - 34.0 pg   MCHC 34.1 30.0 - 36.0 g/dL   RDW 40.9 81.1 - 91.4 %   Platelets 358 150 - 400 K/uL  Wet prep, genital     Status: Abnormal   Collection Time: 08/05/15  3:35 PM  Result Value Ref Range   Yeast Wet Prep HPF POC NONE SEEN NONE SEEN   Trich, Wet Prep NONE SEEN NONE SEEN   Clue Cells Wet Prep HPF POC NONE SEEN NONE SEEN   WBC, Wet Prep HPF POC MODERATE (A) NONE SEEN    MDM UPT negative Zofran ODT Pt did not vomit  while in MAU Wet prep did not show yeast - based on patient complaints, hx of yeast infections, and assessment, will treat for yeast infection.  Assessment and Plan  A: 1. Cystitis  2. Yeast infection   3. Screen for STD (sexually transmitted disease)      P: Discharge home Rx phenergan, septra, pyridium, & diflucan Discussed change in urine color with pyridium Discussed reasons to go to urgent care or Ed for worsening symptoms.  GC/CT, HIV, RPR, & urine culture pending   Judeth Horn, NP  08/05/2015, 2:32 PM

## 2015-08-06 LAB — RPR: RPR Ser Ql: NONREACTIVE

## 2015-08-06 LAB — HIV ANTIBODY (ROUTINE TESTING W REFLEX): HIV Screen 4th Generation wRfx: NONREACTIVE

## 2015-08-07 LAB — URINE CULTURE: Culture: >=100000

## 2015-08-08 LAB — GC/CHLAMYDIA PROBE AMP (~~LOC~~) NOT AT ARMC
Chlamydia: NEGATIVE
Neisseria Gonorrhea: NEGATIVE

## 2015-08-22 ENCOUNTER — Inpatient Hospital Stay (HOSPITAL_COMMUNITY)
Admission: AD | Admit: 2015-08-22 | Discharge: 2015-08-22 | Disposition: A | Discharge status: Home / Self Care | Payer: Medicaid Other | Source: Ambulatory Visit | Attending: Family Medicine | Admitting: Family Medicine

## 2015-08-22 DIAGNOSIS — Z3202 Encounter for pregnancy test, result negative: Secondary | ICD-10-CM | POA: Diagnosis not present

## 2015-08-22 DIAGNOSIS — Z3200 Encounter for pregnancy test, result unknown: Secondary | ICD-10-CM | POA: Diagnosis present

## 2015-08-22 LAB — HCG, SERUM, QUALITATIVE: PREG SERUM: NEGATIVE

## 2015-08-22 LAB — POCT PREGNANCY, URINE: Preg Test, Ur: NEGATIVE

## 2015-08-22 NOTE — MAU Note (Signed)
Patient presents for a pregnancy test. Denies pain, bleeding or discharge.

## 2015-08-22 NOTE — MAU Provider Note (Signed)
S:  Kellie Davila is a 29 y.o. G1P1001 at Unknown wks here for confirmation of pregnancy.  Patient's Patient's last menstrual period was 07/27/2015.Marland Kitchen.  Denies any vaginal bleeding or abdominal pain.  States had positive UPT at home.   O:  Past Medical History  Diagnosis Date  . No pertinent past medical history   . Abnormal Pap smear   . Anemia   . Anxiety     worse when on meds  . Vaginal Pap smear, abnormal     did bx, ok since    Family History  Problem Relation Age of Onset  . Alcohol abuse Neg Hx   . Arthritis Neg Hx   . Asthma Neg Hx   . Birth defects Neg Hx   . Cancer Neg Hx   . COPD Neg Hx   . Depression Neg Hx   . Diabetes Neg Hx   . Drug abuse Neg Hx   . Early death Neg Hx   . Hearing loss Neg Hx   . Heart disease Neg Hx   . Hyperlipidemia Neg Hx   . Hypertension Neg Hx   . Kidney disease Neg Hx   . Learning disabilities Neg Hx   . Mental illness Neg Hx   . Mental retardation Neg Hx   . Miscarriages / Stillbirths Neg Hx   . Stroke Neg Hx   . Vision loss Neg Hx      Filed Vitals:   08/22/15 1105  BP: 110/56  Pulse: 76  Temp: 98.6 F (37 C)  Resp: 16   General:  A&OX3 with no signs of acute distress. She appears well-developed and well-nourished. No distress.  Neck: Normal range of motion.  Pulmonary/Chest: Effort normal. No respiratory distress.  Musculoskeletal: Normal range of motion.  Neurological: She is alert and oriented to person, place, and time.  Skin: Skin is warm and dry.   Results for orders placed or performed during the hospital encounter of 08/22/15 (from the past 24 hour(s))  Pregnancy, urine POC     Status: None   Collection Time: 08/22/15 10:58 AM  Result Value Ref Range   Preg Test, Ur NEGATIVE NEGATIVE  hCG, serum, qualitative     Status: None   Collection Time: 08/22/15 11:39 AM  Result Value Ref Range   Preg, Serum NEGATIVE NEGATIVE     A: 1. Encounter for pregnancy test with result negative        P: Discharge home If period doesn't start in 1 week take another home pregnancy test  Judeth HornErin Ayoub Arey, NP

## 2015-09-15 ENCOUNTER — Emergency Department (HOSPITAL_COMMUNITY)
Admission: EM | Admit: 2015-09-15 | Discharge: 2015-09-15 | Disposition: A | Discharge status: Home / Self Care | Payer: Medicaid Other | Attending: Emergency Medicine | Admitting: Emergency Medicine

## 2015-09-15 ENCOUNTER — Emergency Department (HOSPITAL_COMMUNITY): Payer: Medicaid Other

## 2015-09-15 ENCOUNTER — Encounter (HOSPITAL_COMMUNITY): Payer: Self-pay

## 2015-09-15 DIAGNOSIS — J029 Acute pharyngitis, unspecified: Secondary | ICD-10-CM | POA: Diagnosis present

## 2015-09-15 DIAGNOSIS — J069 Acute upper respiratory infection, unspecified: Secondary | ICD-10-CM | POA: Diagnosis not present

## 2015-09-15 DIAGNOSIS — H9209 Otalgia, unspecified ear: Secondary | ICD-10-CM | POA: Insufficient documentation

## 2015-09-15 DIAGNOSIS — Z862 Personal history of diseases of the blood and blood-forming organs and certain disorders involving the immune mechanism: Secondary | ICD-10-CM | POA: Diagnosis not present

## 2015-09-15 DIAGNOSIS — F419 Anxiety disorder, unspecified: Secondary | ICD-10-CM | POA: Diagnosis not present

## 2015-09-15 MED ORDER — LIDOCAINE VISCOUS 2 % MT SOLN: 20.0000 mL | OROMUCOSAL | Status: DC | PRN

## 2015-09-15 MED ORDER — LIDOCAINE VISCOUS 2 % MT SOLN
15.0000 mL | Freq: Once | OROMUCOSAL | Status: AC
  Administered 2015-09-15: 15 mL via OROMUCOSAL
  Filled 2015-09-15: qty 15

## 2015-09-15 NOTE — Discharge Instructions (Signed)
Use nasal saline (you can try Arm and Hammer Simply Saline) at least 4 times a day, use saline 5-10 minutes before using the fluticasone (flonase) nasal spray ° °Do not use Afrin (Oxymetazoline) ° °Rest, wash hands frequently  and drink plenty of water. ° °You may try counter medication such as Mucinex or Sudafed decongestant. ° °Do not hesitate to return to the emergency room for any new, worsening or concerning symptoms. ° °Please obtain primary care using resource guide below. Let them know that you were seen in the emergency room and that they will need to obtain records for further outpatient management. ° ° ° °Upper Respiratory Infection, Adult °Most upper respiratory infections (URIs) are a viral infection of the air passages leading to the lungs. A URI affects the nose, throat, and upper air passages. The most common type of URI is nasopharyngitis and is typically referred to as "the common cold." °URIs run their course and usually go away on their own. Most of the time, a URI does not require medical attention, but sometimes a bacterial infection in the upper airways can follow a viral infection. This is called a secondary infection. Sinus and middle ear infections are common types of secondary upper respiratory infections. °Bacterial pneumonia can also complicate a URI. A URI can worsen asthma and chronic obstructive pulmonary disease (COPD). Sometimes, these complications can require emergency medical care and may be life threatening.  °CAUSES °Almost all URIs are caused by viruses. A virus is a type of germ and can spread from one person to another.  °RISKS FACTORS °You may be at risk for a URI if:  °· You smoke.   °· You have chronic heart or lung disease. °· You have a weakened defense (immune) system.   °· You are very young or very old.   °· You have nasal allergies or asthma. °· You work in crowded or poorly ventilated areas. °· You work in health care facilities or schools. °SIGNS AND SYMPTOMS    °Symptoms typically develop 2-3 days after you come in contact with a cold virus. Most viral URIs last 7-10 days. However, viral URIs from the influenza virus (flu virus) can last 14-18 days and are typically more severe. Symptoms may include:  °· Runny or stuffy (congested) nose.   °· Sneezing.   °· Cough.   °· Sore throat.   °· Headache.   °· Fatigue.   °· Fever.   °· Loss of appetite.   °· Pain in your forehead, behind your eyes, and over your cheekbones (sinus pain). °· Muscle aches.   °DIAGNOSIS  °Your health care provider may diagnose a URI by: °· Physical exam. °· Tests to check that your symptoms are not due to another condition such as: °¨ Strep throat. °¨ Sinusitis. °¨ Pneumonia. °¨ Asthma. °TREATMENT  °A URI goes away on its own with time. It cannot be cured with medicines, but medicines may be prescribed or recommended to relieve symptoms. Medicines may help: °· Reduce your fever. °· Reduce your cough. °· Relieve nasal congestion. °HOME CARE INSTRUCTIONS  °· Take medicines only as directed by your health care provider.   °· Gargle warm saltwater or take cough drops to comfort your throat as directed by your health care provider. °· Use a warm mist humidifier or inhale steam from a shower to increase air moisture. This may make it easier to breathe. °· Drink enough fluid to keep your urine clear or pale yellow.   °· Eat soups and other clear broths and maintain good nutrition.   °· Rest as needed.   °·   Return to work when your temperature has returned to normal or as your health care provider advises. You may need to stay home longer to avoid infecting others. You can also use a face mask and careful hand washing to prevent spread of the virus. °· Increase the usage of your inhaler if you have asthma.   °· Do not use any tobacco products, including cigarettes, chewing tobacco, or electronic cigarettes. If you need help quitting, ask your health care provider. °PREVENTION  °The best way to protect  yourself from getting a cold is to practice good hygiene.  °· Avoid oral or hand contact with people with cold symptoms.   °· Wash your hands often if contact occurs.   °There is no clear evidence that vitamin C, vitamin E, echinacea, or exercise reduces the chance of developing a cold. However, it is always recommended to get plenty of rest, exercise, and practice good nutrition.  °SEEK MEDICAL CARE IF:  °· You are getting worse rather than better.   °· Your symptoms are not controlled by medicine.   °· You have chills. °· You have worsening shortness of breath. °· You have [place] or red mucus. °· You have yellow or [place]o nasal discharge. °· You have pain in your face, especially when you bend forward. °· You have a fever. °· You have swollen neck glands. °· You have pain while swallowing. °· You have white areas in the back of your throat. °SEEK IMMEDIATE MEDICAL CARE IF:  °· You have severe or persistent: °¨ Headache. °¨ Ear pain. °¨ Sinus pain. °¨ Chest pain. °· You have chronic lung disease and any of the following: °¨ Wheezing. °¨ Prolonged cough. °¨ Coughing up blood. °¨ A change in your usual mucus. °· You have a stiff neck. °· You have changes in your: °¨ Vision. °¨ Hearing. °¨ Thinking. °¨ Mood. °MAKE SURE YOU:  °· Understand these instructions. °· Will watch your condition. °· Will get help right away if you are not doing well or get worse. °  °This information is not intended to replace advice given to you by your health care provider. Make sure you discuss any questions you have with your health care provider. °  °Document Released: 04/10/2001 Document Revised: 03/01/2015 Document Reviewed: 01/20/2014 °Elsevier Interactive Patient Education ©2016 Elsevier Inc. ° °Emergency Department Resource Guide °1) Find a Doctor and Pay Out of Pocket °Although you won't have to find out who is covered by your insurance plan, it is a good idea to ask around and get recommendations. You will then need to call the  office and see if the doctor you have chosen will accept you as a new patient and what types of options they offer for patients who are self-pay. Some doctors offer discounts or will set up payment plans for their patients who do not have insurance, but you will need to ask so you aren't surprised when you get to your appointment. ° °2) Contact Your Local Health Department °Not all health departments have doctors that can see patients for sick visits, but many do, so it is worth a call to see if yours does. If you don't know where your local health department is, you can check in your phone book. The CDC also has a tool to help you locate your state's health department, and many state websites also have listings of all of their local health departments. ° °3) Find a Walk-in Clinic °If your illness is not likely to be very severe or complicated, you   may want to try a walk in clinic. These are popping up all over the country in pharmacies, drugstores, and shopping centers. They're usually staffed by nurse practitioners or physician assistants that have been trained to treat common illnesses and complaints. They're usually fairly quick and inexpensive. However, if you have serious medical issues or chronic medical problems, these are probably not your best option. ° °No Primary Care Doctor: °- Call [facility] at  832-8000 - they can help you locate a primary care doctor that  accepts your insurance, provides certain services, etc. °- Physician Referral Service- 1-800-533-3463 ° °Chronic Pain Problems: °Organization         Address  Phone   Notes  °[facility]  (336) 297-2271 Patients need to be referred by their primary care doctor.  ° °Medication Assistance: °Organization         Address  Phone   Notes  °[place] [facility] [address] °[place], [place] [address] (336) 641-8030 --Must be a resident of [place] °-- Must have NO insurance coverage  whatsoever (no Medicaid/ Medicare, etc.) °-- The pt. MUST have a primary care doctor that directs their care regularly and follows them in the community °  °[facility]  (866) 331-1348   °[employer]  (888) 892-1162   ° °Agencies that provide inexpensive medical care: °Organization         Address  Phone   Notes  °[facility]  (336) 832-8035   °[facility]    (336) 832-7272   °[facility] [address] °[place], [place] [address] (336) 832-4777   °[facility] [address], °[place] (336) 271-4999   °[facility]    (336) 373-0678   °[facility]    (336) 272-1050   °[facility] ° [address], [place] Phone:  (336) 832-4444, Fax:  (336) 832-4440 Hours of Operation:  9 am - 6 pm, M-F.  Also accepts Medicaid/Medicare and self-pay.  °[facility] ° [address], [place] Phone: (336) [phone], Fax: (336) [phone]. Hours of Operation:  8:30 am - 5:30 pm, M-F.  Also accepts Medicaid and self-pay.  °[facility] [place] [address], [place] Phone: (336) 878-6027   °[facility] [address], [place], [place] (336)723-1848, Ext. 123 Mondays & Thursdays: 7-9 AM.  First 15 patients are seen on a first come, first serve basis. °  ° °Medicaid-accepting [place] Providers: ° °Organization         Address  Phone   Notes  °[facility] [address], [place] (336) 641-2100 Also accepts self-pay patients.  °[facility] [address], [place] ° (336) 856-9996   °[facility] [address], [place] (336) 288-8857   °[facility] [address] [place] [address], [place] (336) 299-7000   °Veita Bland [address], [place]  ° (336) [phone] Only accepts [place] Access Medicaid patients after they have their name applied  to their card.  ° °Self-Pay (no insurance) in [place]: ° °Organization         Address  Phone   Notes  °Sickle Cell Patients, [facility] [address], [place] (336) 832-1970   °[facility] [address], [place] (  336) 832-4400   °[facility] ° [address], [place] (336) 992-4800   °[facility]/Dr. Osei-Bonsu ° [address] [place] [address], [place] or [address], [place] (336) [phone] Phone number for both [place] and [place] locations is the same.  °[facility] [address], [place] (336) 299-0000   °[facility] [address] [place] [address], [place] or [address] (336) 852-7530 °(336) 878-2260   °[facility] [address], [place] (336) 350-1642, phone; (336) 294-5005, fax Sees patients 1st and 3rd Saturday of every month.  Must not qualify for public or private insurance (i.e. Medicaid, Medicare, [place] Health Choice, Veterans' Benefits) • Household income should be no more than 200% of the poverty level •The clinic cannot treat you if you are pregnant or think you are pregnant • Sexually transmitted diseases are not treated at the clinic.  ° ° °Dental Care: °Organization         Address  Phone  Notes  °[facility] [facility] [address], [place] (336) 641-6152 Accepts children up to age 21 who are enrolled in Medicaid or [place] Health Choice; pregnant women with a Medicaid card; and children who have applied for Medicaid or [place] Health Choice, but were declined, whose parents can pay a reduced fee at time of service.  °[facility] [place]  [address], [place] (336) 641-7733 Accepts children up to age 21 who are enrolled in Medicaid or [place] Health Choice; pregnant women with a Medicaid card; and children who have applied for Medicaid or [place]  Health Choice, but were declined, whose parents can pay a reduced fee at time of service.  °[facility] ° [address], [place] (336) 641-4533 Patients are seen by appointment only. Walk-ins are not accepted. [facility] will see patients 18 years of age and older. °Monday - Tuesday (8am-5pm) °Most Wednesdays (8:30-5pm) °$30 per visit, cash only  °[facility] ° [address], [place] (336) [phone] Patients are seen by appointment only. Walk-ins are not accepted. [facility] will see patients 18 years of age and older. °One Wednesday Evening (Monthly: Volunteer Based).  $30 per visit, cash only  °[facility]  (919) 537-3737 for adults; Children under age 4, call Graduate Pediatric Dentistry at ([phone]) [phone]. Children aged 4-14, please call ([phone]) [phone] to request a pediatric application. ° Dental services are provided in all areas of dental care including fillings, crowns and bridges, complete and partial dentures, implants, gum treatment, root canals, and extractions. Preventive care is also provided. Treatment is provided to both adults and children. °Patients are selected via a lottery and there is often a waiting list. °  °[facility] [address], °[place] ° (336) 763-8833 [email or web address] °  °[facility] [address], [place], [place] (336)723-1848, Ext. 123 Second and Fourth Thursday of each month, opens at 6:30 AM; Clinic ends at 9 AM.  Patients are seen on a first-come first-served basis, and a limited number are seen during each clinic.  ° °[facility] ° [address], [place],  (336) [phone]   Eligibility Requirements °You must have lived in [place], [place], or [place] counties for at least the last three months. °  You cannot be eligible for state   or federal sponsored healthcare insurance, including Veterans Administration, Medicaid, or Medicare. °   You generally cannot be eligible for healthcare insurance through your employer.  °  How to apply: °Eligibility screenings are held every Tuesday and Wednesday afternoon from 1:00 pm until 4:00 pm. You do not need an appointment for the interview!  °[facility] [address], [place], [place] 336-631-2330   °[facility]  336-342-8273   °[facility]  336-703-3100   °[facility]  336-570-6415   ° °Behavioral Health Resources in the Community: °Intensive Outpatient Programs °Organization         Address  Phone  Notes  °[facility] [address], [place], [place] 336-878-6098   °[facility] [address], [place], [place] 336-832-9800   °[facility] [address], [place], [place] ° 336-882-2125   °[facility] [address],  °[place], [place] 1-800-853-5163 or 336-641-4981   °Substance Abuse Resources °Organization         Address  Phone  Notes  °[facility]  336-882-2125   °[facility]  336-784-9470   °[facility]  336-285-9073   °[facility]  336-845-3988   °Residential & Outpatient Substance Abuse Program  1-800-659-3381   °Psychological Services °Organization         Address  Phone  Notes  °[facility]  336- 832-9600   °[facility]  336- 378-7881   °[facility] [address], [place] 1-800-853-5163 or 336-641-4981   ° °Mobile Crisis Teams °Organization         Address  Phone  Notes  °[facility], Mobile Crisis Care Unit  1-877-626-1772   °Assertive °[facility] ° [address] [place], [place] 336-834-9664   °Sharon DeEsch [address] °[place] 336-554-5454   ° °Self-Help/Support Groups °Organization         Address  Phone             Notes  °[facility] - variety of support groups  336- 373-1402 Call  for more information  °Narcotics Anonymous (NA), [facility] [address], °[place]  2 meetings at this location  ° °Residential Treatment Programs °Organization         Address  Phone  Notes  °[facility] [address],    °[place]  1-866-801-8205   °[facility] ° [address], [place], [place] 704-293-8524   °[facility] [address], [place] 336-845-3988 Admissions: 8am-3pm M-F  °[facility] [address]    °[place], [place] 336-841-1104   °[facility] [address], [place], [place] 336-379-7146   °[facility] [address]  °[place], [place] 336-285-9073   °[facility] - Intensive Outpatient [address], , [place] 336-852-3033   °[facility] [address]  °[place], [place] 1-877-615-2722 or 336-784-9470   °[facility] [address] [place], [place] 336-227-7417 Accepts Medicaid  °[facility] [address]  ° [place] 1-800-659-3381 Substance Abuse/Addiction Treatment  ° °[place] Behavioral Health Resources °Organization         Address  Phone  Notes  °[facility]  (888) 581-9988   °Julie Brannon, PhD [address] [place], [place]   (  336) 349-5553 or (336) 951-0000   °[facility]   [address] °[place], [place] (336) 349-4454   °[facility] [address], [place], [place] (336) 342-8316 Insurance/Medicaid/sponsorship through [facility]  °[facility] [address]                                    [place], [place] (336) 342-8316 Therapy/tele-psych/case  °[facility] [address]  ° [place], [place] (336) 349-2233    °Dr. Arfeen  (336) 349-4544   °[facility] [place]  [employer] [place] [facility] 1) [address],  °2) [address], [place] °3)  [address], [place]  (336) 349-3220 °(336) 342-7768 ° °(336) 342-8140   °[place] [facility] (336) 342-1394 or (336) 342-3537 (After Hours)    ° ° ° °

## 2015-09-15 NOTE — ED Notes (Signed)
Pt complaining of sore throat, chest tightness, productive cough (green phlegm), nasal congestion, and body aches starting Tuesday night. Pt has tried ibuprofen and alkaseltzer at home. No distress noted.

## 2015-09-15 NOTE — ED Provider Notes (Signed)
CSN: [id number]     Arrival date & time 09/15/15  1839 History  By signing my name below, I, Doreatha Martin, attest that this documentation has been prepared under the direction and in the presence of [employer], PA-C.  Electronically Signed: Doreatha Martin, ED Scribe. 09/15/2015. 7:15 PM.    Chief Complaint  Patient presents with  . URI   The history is provided by the patient. No language interpreter was used.    HPI Comments: Kellie Davila is a 29 y.o. female who presents to the Emergency Department complaining of moderate sore throat onset 2 days ago. Pt states associated otalgia, generalized myalgias, productive cough with green phlegm, nasal congestion, mild nausea, chest tightness secondary to cough, HA. Pt states she has tried Catering manager and ibuprofen with little to no relief of symptoms. She has also tried Oxymetazone nasal spray for a week with temporary relief. She notes difficulty eating and drinking secondary to sore throat and congestion. Last dose of Ibuprofen was at 8AM. She denies fever, emesis, rash.    Past Medical History  Diagnosis Date  . No pertinent past medical history   . Abnormal Pap smear   . Anemia   . Anxiety     worse when on meds  . Vaginal Pap smear, abnormal     did bx, ok since   Past Surgical History  Procedure Laterality Date  . Cesarean section     Family History  Problem Relation Age of Onset  . Alcohol abuse Neg Hx   . Arthritis Neg Hx   . Asthma Neg Hx   . Birth defects Neg Hx   . Cancer Neg Hx   . COPD Neg Hx   . Depression Neg Hx   . Diabetes Neg Hx   . Drug abuse Neg Hx   . Early death Neg Hx   . Hearing loss Neg Hx   . Heart disease Neg Hx   . Hyperlipidemia Neg Hx   . Hypertension Neg Hx   . Kidney disease Neg Hx   . Learning disabilities Neg Hx   . Mental illness Neg Hx   . Mental retardation Neg Hx   . Miscarriages / Stillbirths Neg Hx   . Stroke Neg Hx   . Vision loss Neg Hx    Social History  Substance Use  Topics  . Smoking status: Never Smoker   . Smokeless tobacco: Never Used  . Alcohol Use: No   OB History    Gravida Para Term Preterm AB TAB SAB Ectopic Multiple Living   Review of Systems A complete 10 system review of systems was obtained and all systems are negative except as noted in the HPI and PMH.    Allergies  Review of patient's allergies indicates no known allergies.  Home Medications   Prior to Admission medications   Medication Sig Start Date End Date Taking? Authorizing Provider  fluconazole (DIFLUCAN) 150 MG tablet Take 1 tablet (150 mg total) by mouth once. 08/05/15   Judeth Horn, NP  ibuprofen (ADVIL,MOTRIN) 200 MG tablet Take 400 mg by mouth every 6 (six) hours as needed for moderate pain.    Historical Provider, MD  promethazine (PHENERGAN) 25 MG tablet Take 1 tablet (25 mg total) by mouth every 6 (six) hours as needed for nausea or vomiting. 08/05/15   Judeth Horn, NP   BP 112/77 mmHg  Pulse 96  Temp(Src)  98.2 F (36.8 C) (Oral)  Resp 18  SpO2 100%  LMP 08/26/2015 Physical Exam  Constitutional: She is oriented to person, place, and time. She appears well-developed and well-nourished. No distress.  HENT:  Head: Normocephalic and atraumatic.  Mouth/Throat: Oropharynx is clear and moist.  No drooling or stridor. Posterior pharynx mildly erythematous no significant tonsillar hypertrophy. No exudate. Soft palate rises symmetrically. No TTP or induration under tongue.   No tenderness to palpation of frontal or bilateral maxillary sinuses.  No mucosal edema in the nares.  Bilateral tympanic membranes with normal architecture and good light reflex.    Eyes: Conjunctivae and EOM are normal. Pupils are equal, round, and reactive to light.  Neck: Normal range of motion.  Cardiovascular: Normal rate, regular rhythm and intact distal pulses.   Pulmonary/Chest: Effort normal and breath sounds normal. No stridor. No respiratory distress. She has  no wheezes. She has no rales. She exhibits no tenderness.  Abdominal: Soft. Bowel sounds are normal. She exhibits no distension and no mass. There is no tenderness. There is no rebound and no guarding.  Musculoskeletal: Normal range of motion. She exhibits no edema.  Lymphadenopathy:    She has no cervical adenopathy.  Neurological: She is alert and oriented to person, place, and time.  Psychiatric: She has a normal mood and affect.  Nursing note and vitals reviewed.   ED Course  Procedures (including critical care time) DIAGNOSTIC STUDIES: Oxygen Saturation is 100% on RA, normal by my interpretation.    COORDINATION OF CARE: 7:13 PM Discussed treatment plan with pt at bedside and pt agreed to plan.   Imaging Review No results found. I have personally reviewed and evaluated these images as part of my medical decision-making.  MDM   Final diagnoses:  URI (upper respiratory infection)    Filed Vitals:   09/15/15 1849  BP: 112/77  Pulse: 96  Temp: 98.2 F (36.8 C)  TempSrc: Oral  Resp: 18  SpO2: 100%    Medications  lidocaine (XYLOCAINE) 2 % viscous mouth solution 15 mL (15 mLs Mouth/Throat Given 09/15/15 1926)    Kellie Davila is 29 y.o. female presenting with runny nose, sore throat, cough. Patient is afebrile, vital signs without abnormality. Lung sounds are clear to auscultation. Patient is saturating well on room air. Chest x-ray without infiltrate. Physical exam is not consistent with a streptococcal pharyngitis. Encouraged patient to push fluids, continue Motrin, viscous lidocaine given for comfort. Advised her to DC the oxymetazoline and use Flonase instead.  Evaluation does not show pathology that would require ongoing emergent intervention or inpatient treatment. Pt is hemodynamically stable and mentating appropriately. Discussed findings and plan with patient/guardian, who agrees with care plan. All questions answered. Return precautions discussed and  outpatient follow up given.   New Prescriptions   LIDOCAINE (XYLOCAINE) 2 % SOLUTION    Use as directed 20 mLs in the mouth or throat as needed for mouth pain.     I personally performed the services described in this documentation, which was scribed in my presence. The recorded information has been reviewed and is accurate.    Wynetta Emeryicole Leina Babe, PA-C 09/15/15 1930  Gwyneth SproutWhitney Plunkett, MD 09/15/15 [phone]

## 2015-10-15 ENCOUNTER — Emergency Department (HOSPITAL_COMMUNITY)
Admission: EM | Admit: 2015-10-15 | Discharge: 2015-10-16 | Disposition: A | Discharge status: Home / Self Care | Payer: Medicaid Other | Attending: Emergency Medicine | Admitting: Emergency Medicine

## 2015-10-15 DIAGNOSIS — Z792 Long term (current) use of antibiotics: Secondary | ICD-10-CM | POA: Diagnosis not present

## 2015-10-15 DIAGNOSIS — G43909 Migraine, unspecified, not intractable, without status migrainosus: Secondary | ICD-10-CM | POA: Insufficient documentation

## 2015-10-15 DIAGNOSIS — J019 Acute sinusitis, unspecified: Secondary | ICD-10-CM | POA: Insufficient documentation

## 2015-10-15 DIAGNOSIS — R0981 Nasal congestion: Secondary | ICD-10-CM | POA: Diagnosis present

## 2015-10-15 DIAGNOSIS — Z8659 Personal history of other mental and behavioral disorders: Secondary | ICD-10-CM | POA: Insufficient documentation

## 2015-10-15 DIAGNOSIS — R519 Headache, unspecified: Secondary | ICD-10-CM

## 2015-10-15 DIAGNOSIS — R51 Headache: Secondary | ICD-10-CM

## 2015-10-15 MED ORDER — ONDANSETRON 4 MG PO TBDP
4.0000 mg | ORAL_TABLET | Freq: Once | ORAL | Status: AC
  Administered 2015-10-15: 4 mg via ORAL
  Filled 2015-10-15: qty 1

## 2015-10-15 MED ORDER — KETOROLAC TROMETHAMINE 30 MG/ML IJ SOLN: 30.0000 mg | Freq: Once | INTRAMUSCULAR | Status: DC

## 2015-10-15 MED ORDER — SODIUM CHLORIDE 0.9 % IV BOLUS (SEPSIS): 1000.0000 mL | Freq: Once | INTRAVENOUS | Status: DC

## 2015-10-15 MED ORDER — IBUPROFEN 800 MG PO TABS: 800.0000 mg | ORAL_TABLET | Freq: Three times a day (TID) | ORAL | Status: DC

## 2015-10-15 MED ORDER — DM-GUAIFENESIN ER 30-600 MG PO TB12: 1.0000 | ORAL_TABLET | Freq: Two times a day (BID) | ORAL | Status: DC

## 2015-10-15 MED ORDER — DIPHENHYDRAMINE HCL 50 MG/ML IJ SOLN: 25.0000 mg | Freq: Once | INTRAMUSCULAR | Status: DC

## 2015-10-15 MED ORDER — IBUPROFEN 800 MG PO TABS
800.0000 mg | ORAL_TABLET | Freq: Once | ORAL | Status: AC
  Administered 2015-10-15: 800 mg via ORAL
  Filled 2015-10-15: qty 1

## 2015-10-15 MED ORDER — DM-GUAIFENESIN ER 30-600 MG PO TB12
1.0000 | ORAL_TABLET | Freq: Two times a day (BID) | ORAL | Status: DC
  Administered 2015-10-15: 1 via ORAL
  Filled 2015-10-15: qty 1

## 2015-10-15 MED ORDER — AMOXICILLIN-POT CLAVULANATE 875-125 MG PO TABS
1.0000 | ORAL_TABLET | Freq: Once | ORAL | Status: AC
  Administered 2015-10-16: 1 via ORAL
  Filled 2015-10-15: qty 1

## 2015-10-15 MED ORDER — PROCHLORPERAZINE EDISYLATE 5 MG/ML IJ SOLN: 10.0000 mg | Freq: Four times a day (QID) | INTRAMUSCULAR | Status: DC | PRN

## 2015-10-15 MED ORDER — PROMETHAZINE HCL 25 MG PO TABS: 25.0000 mg | ORAL_TABLET | Freq: Four times a day (QID) | ORAL | Status: DC | PRN

## 2015-10-15 MED ORDER — AMOXICILLIN-POT CLAVULANATE 875-125 MG PO TABS: 1.0000 | ORAL_TABLET | Freq: Two times a day (BID) | ORAL | Status: DC

## 2015-10-15 MED ORDER — ONDANSETRON HCL 4 MG/2ML IJ SOLN: 4.0000 mg | Freq: Once | INTRAMUSCULAR | Status: DC

## 2015-10-15 NOTE — ED Notes (Addendum)
Writer provided pt with a cup of H2O to encourage fluids.

## 2015-10-15 NOTE — ED Notes (Signed)
Pt c/o headache nausea, eye pain for 1 week, with no relief from otc pain medications.

## 2015-10-15 NOTE — ED Provider Notes (Signed)
CSN: [id number]     Arrival date & time 10/15/15  2115 History   First MD Initiated Contact with Patient 10/15/15 2206     Chief Complaint  Patient presents with  . Headache     (Consider location/radiation/quality/duration/timing/severity/associated sxs/prior Treatment) HPI   Kellie Davila is a 29 y.o. female, with a history of migraines, presenting to the ED with a headache for the past 10 days. Pt also complains of nausea and some sinus congestion. Pt states the pain is in her forehead, around her eyes, and in her cheeks. Pain is dull, 6/10, non-radiating. Pt has tried ibuprofen, excedrin, and tylenol, as well as sudafed for congestion. Pt states she has had headaches like this before, but this headache is a little worse and she was worried because it didn't go away. Pt denies vomiting, visual disturbances, neck pain, fever/chills, recent illness, neuro deficits, or any other pain or complaints.     Past Medical History  Diagnosis Date  . No pertinent past medical history   . Abnormal Pap smear   . Anemia   . Anxiety     worse when on meds  . Vaginal Pap smear, abnormal     did bx, ok since   Past Surgical History  Procedure Laterality Date  . Cesarean section     Family History  Problem Relation Age of Onset  . Alcohol abuse Neg Hx   . Arthritis Neg Hx   . Asthma Neg Hx   . Birth defects Neg Hx   . Cancer Neg Hx   . COPD Neg Hx   . Depression Neg Hx   . Diabetes Neg Hx   . Drug abuse Neg Hx   . Early death Neg Hx   . Hearing loss Neg Hx   . Heart disease Neg Hx   . Hyperlipidemia Neg Hx   . Hypertension Neg Hx   . Kidney disease Neg Hx   . Learning disabilities Neg Hx   . Mental illness Neg Hx   . Mental retardation Neg Hx   . Miscarriages / Stillbirths Neg Hx   . Stroke Neg Hx   . Vision loss Neg Hx    Social History  Substance Use Topics  . Smoking status: Never Smoker   . Smokeless tobacco: Never Used  . Alcohol Use: No   OB History    Gravida  Para Term Preterm AB TAB SAB Ectopic Multiple Living   Review of Systems  Constitutional: Negative for fever, chills and diaphoresis.  Respiratory: Negative for shortness of breath.   Gastrointestinal: Positive for nausea. Negative for vomiting.  Musculoskeletal: Negative for back pain and neck pain.  Neurological: Positive for headaches. Negative for dizziness, tremors, syncope, facial asymmetry, speech difficulty, weakness, light-headedness and numbness.  All other systems reviewed and are negative.     Allergies  Review of patient's allergies indicates no known allergies.  Home Medications   Prior to Admission medications   Medication Sig Start Date End Date Taking? Authorizing Provider  acetaminophen (TYLENOL) 500 MG tablet Take 500 mg by mouth every 6 (six) hours as needed for headache.   Yes Historical Provider, MD  aspirin-acetaminophen-caffeine (EXCEDRIN MIGRAINE) [phone] MG tablet Take 1 tablet by mouth every 6 (six) hours as needed for headache.   Yes Historical Provider, MD  ibuprofen (ADVIL,MOTRIN) 200 MG tablet Take 400 mg by mouth every 6 (six) hours as needed for  moderate pain.   Yes Historical Provider, MD  amoxicillin-clavulanate (AUGMENTIN) 875-125 MG tablet Take 1 tablet by mouth every 12 (twelve) hours. 10/15/15   Marlin Brys C Vin Yonke, PA-C  azithromycin (ZITHROMAX) 500 MG tablet Take 1 tablet by mouth daily. Reported on 10/15/2015 09/19/15   Historical Provider, MD  dextromethorphan-guaiFENesin (MUCINEX DM) 30-600 MG 12hr tablet Take 1 tablet by mouth 2 (two) times daily. 10/15/15   Taqwa Deem C Siniya Lichty, PA-C  ibuprofen (ADVIL,MOTRIN) 800 MG tablet Take 1 tablet (800 mg total) by mouth 3 (three) times daily. 10/15/15   Sebastion Jun C Misha Antonini, PA-C  promethazine (PHENERGAN) 25 MG tablet Take 1 tablet (25 mg total) by mouth every 6 (six) hours as needed for nausea or vomiting. 10/15/15   Idonna Heeren C Anicia Leuthold, PA-C   BP 119/70 mmHg  Pulse 77  Temp(Src) 98.3 F (36.8 C) (Oral)   Resp 18  SpO2 99%  LMP 10/15/2015 (Exact Date) Physical Exam  Constitutional: She appears well-developed and well-nourished. No distress.  HENT:  Head: Normocephalic and atraumatic.  Right Ear: Tympanic membrane, external ear and ear canal normal.  Left Ear: Tympanic membrane, external ear and ear canal normal.  Nose: Nose normal.  Mouth/Throat: Uvula is midline, oropharynx is clear and moist and mucous membranes are normal.  Eyes: Conjunctivae and EOM are normal. Pupils are equal, round, and reactive to light.  Neck: Normal range of motion. Neck supple.  Cardiovascular: Normal rate, regular rhythm, normal heart sounds and intact distal pulses.   Pulmonary/Chest: Effort normal and breath sounds normal. No respiratory distress.  Abdominal: Soft. Bowel sounds are normal.  Musculoskeletal: She exhibits no edema or tenderness.  Full ROM in all extremities and spine. No paraspinal tenderness.    Lymphadenopathy:    She has no cervical adenopathy.  Neurological: She is alert.  No sensory deficits. Strength 5/5 in all extremities. No gait disturbance. Cranial nerves III-XII grossly intact. No facial droop.  Skin: Skin is warm and dry. She is not diaphoretic.  Nursing note and vitals reviewed.   ED Course  Procedures (including critical care time) Labs Review Labs Reviewed - No data to display  Imaging Review No results found. I have personally reviewed and evaluated these images and lab results as part of my medical decision-making.   EKG Interpretation None      MDM   Final diagnoses:  Acute nonintractable headache, unspecified headache type  Acute sinusitis, recurrence not specified, unspecified location    Kellie Davila presents with a headache, nausea, and sinus congestion for the past 10 days.   Patient's symptoms are suggestive of a sinus headache in location, quality, and duration. Patient is nontoxic looking, has no neuro deficits, afebrile, not tachycardic,  normotensive, and has no red flag symptoms. Patient was offered Toradol but stated that she would prefer ibuprofen 800 mg instead. Patient also requested that we give as many things by mouth as possible rather than give an IV. Oral intake was encouraged. 11:57 PM patient states that her pain has subsided and so has her nausea. Patient also states that her congestion has cleared. Patient states that she is ready to be discharged. Patient given instructions for supportive care and return precautions. Patient voiced understanding of these instructions and agreed to the plan.  Filed Vitals:   10/15/15 2127 10/15/15 2342  BP: 118/73 119/70  Pulse: 70 77  Temp: 98.3 F (36.8 C)   TempSrc: Oral   Resp: 18 18  SpO2: 100% 99%     Melinda Pottinger C Zyaira Vejar,  PA-C 10/16/15 0001  Mancel Bale, MD 10/16/15 1109

## 2015-10-15 NOTE — Discharge Instructions (Signed)
You have been seen today for headache. Follow up with PCP as needed for chronic management of headaches. Return to ED should symptoms worsen. You may use salt water rinses to clear congestion. Please take all of your antibiotics until finished!   You may develop abdominal discomfort or diarrhea from the antibiotic.  You may help offset this with probiotics which you can buy or get in yogurt. Do not eat or take the probiotics until 2 hours after your antibiotic.    Emergency Department Resource Guide 1) Find a Doctor and Pay Out of Pocket Although you won't have to find out who is covered by your insurance plan, it is a good idea to ask around and get recommendations. You will then need to call the office and see if the doctor you have chosen will accept you as a new patient and what types of options they offer for patients who are self-pay. Some doctors offer discounts or will set up payment plans for their patients who do not have insurance, but you will need to ask so you aren't surprised when you get to your appointment.  2) Contact Your Local Health Department Not all health departments have doctors that can see patients for sick visits, but many do, so it is worth a call to see if yours does. If you don't know where your local health department is, you can check in your phone book. The CDC also has a tool to help you locate your state's health department, and many state websites also have listings of all of their local health departments.  3) Find a Walk-in Clinic If your illness is not likely to be very severe or complicated, you may want to try a walk in clinic. These are popping up all over the country in pharmacies, drugstores, and shopping centers. They're usually staffed by nurse practitioners or physician assistants that have been trained to treat common illnesses and complaints. They're usually fairly quick and inexpensive. However, if you have serious medical issues or chronic medical  problems, these are probably not your best option.  No Primary Care Doctor: - Call Health Connect at  [phone] - they can help you locate a primary care doctor that  accepts your insurance, provides certain services, etc. - Physician Referral Service- [phone]  Chronic Pain Problems: Organization         Address  Phone   Notes  [facility]  [phone]) [phone] Patients need to be referred by their primary care doctor.   Medication Assistance: Organization         Address  Phone   Notes  [facility] [address] [place], [place] [address] [phone]) [phone] --Must be a resident of [place] -- Must have NO insurance coverage whatsoever (no Medicaid/ Medicare, etc.) -- The pt. MUST have a primary care doctor that directs their care regularly and follows them in the community   MedAssist  [phone]   [employer]  [phone]    Agencies that provide inexpensive medical care: Organization         Address  Phone   Notes  [facility]  [phone]) [phone]   [facility]    [phone]) [phone]   [facility] [address] [place], [place] [address] [phone]) [phone]   [facility] [address], [place] [phone]   Planned Parenthood    [phone]) [phone]   [facility]    [phone]   [facility]  [address], [place] Phone:  [phone], Fax:  [phone] Hours of Operation:  9 am - 6 pm, M-F.  Also accepts Medicaid/Medicare and self-pay.  [facility] for [place] [address], [address], [place] Phone: [phone], Fax: [phone]. Hours of Operation:  8:30 am - 5:30 pm, M-F.  Also accepts Medicaid and self-pay.  [facility] [place] [address] [place] Phone: [phone]   [place], [place], [place] [phone], Ext. [phone]  Mondays & Thursdays: 7-9 AM.  First 15 patients are seen on a first come, first serve basis.    [place] Providers:  Organization         Address  Phone   Notes  [facility] [address], [place] [phone] Also accepts self-pay patients.  [facility] [address], [place]  [phone]   [place], [address], [place] [phone]   [facility] [address] [place] [phone]   Lucianne Lei [address], [place]   [phone] Only accepts [place] Access [place] patients after they have their name applied to their card.   Self-Pay (no insurance) in [facility]:  Organization         Address  Phone   Notes  Sickle Cell Patients, [facility] [place] [phone]   [facility] [place] [phone]   [facility] [place]  [place], [address], [place] [phone]   [facility]/Dr. Osei-Bonsu  [address], [place] or [address], [place] [phone] Phone number for both [place] and [place] locations is the same.  [facility] [address] [place] [phone]   [facility] [address], [place] or [address] [phone]   [facility] [address], [place] [phone], phone; [phone], fax Sees patients 1st and 3rd Saturday of every month.  Must not qualify for public or private insurance (i.e. Medicaid, Medicare, [place] Health Choice, Veterans' Benefits)  Household income should be no more than 200% of the poverty level The clinic cannot treat you if you are pregnant or think you are pregnant  Sexually transmitted diseases are not treated at  the clinic.    Dental Care: Organization         Address  Phone  Notes  [facility] [place] [phone] Accepts children up to age 83 who are enrolled in [place] or [place]; pregnant women with a Medicaid card; and children who have applied for Medicaid or [place] Health Choice, but were declined, whose parents can pay a reduced fee at time of service.  [facility] of [facility]  [address], [place] [phone] Accepts children up to age 32 who are enrolled in [place] or [place]; pregnant women with a Medicaid card; and children who have applied for Medicaid or [place] Health Choice, but were declined, whose parents can pay a reduced fee at time of  service.  [place] Access PROGRAM  [place] [phone] Patients are seen by appointment only. Walk-ins are not accepted. [place] will see patients 95 years of age and older. Monday - Tuesday (8am-5pm) Most Wednesdays (8:30-5pm) $30 per visit, cash only  [facility] Adult [facility] Access PRO[facility]  [address], [facility] [phone] Patients are seen by appointment only. Walk-ins are not accepted. [place] will see patients 28 years of age and older. One Wednesday Evening (Monthly: Volunteer Based).  $30 per visit, cash only  [place]  [phone] for adults; Children under age 31, call Graduate Pediatric Dentistry at [phone]. Children aged 55-14, please call [phone] to request a pediatric application.  Dental services are provided in all areas of dental care including fillings, crowns and bridges, complete and partial dentures, implants, gum treatment, root canals, and extractions. Preventive care is also provided. Treatment is provided to both adults and children. Patients are selected via a lottery and there is often a  waiting list.   [facility] [address], [place]  [phone] [email or web address]   [facility] [address] [place], [place] [phone], Ext. [phone] Second and Fourth Thursday of each month, opens at 6:30 AM; Clinic ends at 9 AM.  Patients are seen on a first-come first-served basis, and a limited number are seen during each clinic.   [facility]  [address] Hillard Danker [place], [place] [phone]   Eligibility Requirements You must have lived in [place], [place], or [place] counties for at least the last three months.   You cannot be eligible for state or federal sponsored [employer], including [employer], [place], or [employer].   You generally cannot be eligible for healthcare insurance through your employer.    How to apply: Eligibility screenings are held every Tuesday and Wednesday afternoon from 1:00 pm until 4:00 pm. You do not need an appointment for the interview!  [facility] [address], [place], [place]   [place]  [place] Department  [place]  [phone]    Behavioral Health Resources in the Community: Intensive Outpatient Programs Organization         Address  Phone  Notes  [employer] [address], [place], [place] [phone]   [facility] [address] [place], [place]   ADS: [facility] Drug [facility] [address] [place], [place]   [place] [address],  [place], [place] or [phone]   Substance Abuse Resources Organization         Address  Phone  Notes  Alcohol and Drug Services  [phone]   [place]  [phone]   The [place]   Chinita Pester  [phone]   Residential & Outpatient Substance Abuse  Program  [phone]   Psychological Services Organization         Address  Phone  Notes  [facility]  [place]  [phone]   [place] [address], [place] [phone] or [phone]    Mobile Crisis Teams Organization         Address  Phone  Notes  [facility], Mobile Crisis Care Unit  [phone]   Assertive Psychotherapeutic Services  [place], [place]   [facility] [address] [place] [phone]    Self-Help/Support Groups Organization         Address  Phone             Notes  [place]. of [place] - variety of support groups  [place] Call for more information  Narcotics Anonymous (NA), [facility] [address], [employer] [place]  2 meetings at this location   Special educational needs teacher         Address  Phone  Notes  [facility] [place],    [place]  [phone]   [facility]  [address], [place] [address]37628, [place], [place]   [place], [place] [phone] Admissions: 8am-3pm M-F  Incentives Substance [place] [address],    [place], [place] [phone]   [facility] [address] [place], [place], [place]   [facility] [address],  [place], [place]   [facility] Programs - Intensive Outpatient [address], [place], [place]   [facility] ([place].,  [place], [place] [phone] or [phone]   [facility] (RTS) [address] [place], [place] Accepts Medicaid  [facility] [place] [address]  [place] [phone] Substance Abuse/Addiction Treatment   [facility] Organization          Address  Phone  Notes  [facility]  [phone]   Domenic Schwab, PhD [address] [place]lis [place], [place]   [phone] or [phone]   [facility] [place], [place] [phone]   [facility] [address] [place], [place] [phone] Insurance/Medicaid/sponsorship through [facility] and Families [address]                                    [place], [place] [phone] [place] [address], [place] [phone]    Dr. Adele Schilder  [phone]   [facility] Dept. 1) [address], [place] 2) [place] 3)  [place] [address], [place] [phone]  [phone]   [place] [phone] or [phone] (After Hours)

## 2015-10-16 MED ORDER — PROMETHAZINE HCL 25 MG PO TABS
25.0000 mg | ORAL_TABLET | Freq: Once | ORAL | Status: AC
  Administered 2015-10-16: 25 mg via ORAL
  Filled 2015-10-16: qty 1

## 2015-11-04 ENCOUNTER — Emergency Department (HOSPITAL_COMMUNITY)
Admission: EM | Admit: 2015-11-04 | Discharge: 2015-11-04 | Disposition: A | Discharge status: Home / Self Care | Payer: Medicaid Other | Attending: Emergency Medicine | Admitting: Emergency Medicine

## 2015-11-04 ENCOUNTER — Emergency Department (HOSPITAL_COMMUNITY): Payer: Medicaid Other

## 2015-11-04 ENCOUNTER — Encounter (HOSPITAL_COMMUNITY): Payer: Self-pay

## 2015-11-04 DIAGNOSIS — G930 Cerebral cysts: Secondary | ICD-10-CM | POA: Insufficient documentation

## 2015-11-04 DIAGNOSIS — R51 Headache: Secondary | ICD-10-CM | POA: Diagnosis present

## 2015-11-04 DIAGNOSIS — Z8679 Personal history of other diseases of the circulatory system: Secondary | ICD-10-CM | POA: Insufficient documentation

## 2015-11-04 DIAGNOSIS — R519 Headache, unspecified: Secondary | ICD-10-CM

## 2015-11-04 DIAGNOSIS — Z862 Personal history of diseases of the blood and blood-forming organs and certain disorders involving the immune mechanism: Secondary | ICD-10-CM | POA: Insufficient documentation

## 2015-11-04 DIAGNOSIS — Z8659 Personal history of other mental and behavioral disorders: Secondary | ICD-10-CM | POA: Diagnosis not present

## 2015-11-04 LAB — PREGNANCY, URINE: PREG TEST UR: NEGATIVE

## 2015-11-04 MED ORDER — DIPHENHYDRAMINE HCL 50 MG/ML IJ SOLN
25.0000 mg | Freq: Once | INTRAMUSCULAR | Status: AC
  Administered 2015-11-04: 25 mg via INTRAVENOUS
  Filled 2015-11-04: qty 1

## 2015-11-04 MED ORDER — KETOROLAC TROMETHAMINE 30 MG/ML IJ SOLN
30.0000 mg | Freq: Once | INTRAMUSCULAR | Status: AC
  Administered 2015-11-04: 30 mg via INTRAVENOUS
  Filled 2015-11-04: qty 1

## 2015-11-04 MED ORDER — PROCHLORPERAZINE EDISYLATE 5 MG/ML IJ SOLN
10.0000 mg | Freq: Once | INTRAMUSCULAR | Status: AC
  Administered 2015-11-04: 10 mg via INTRAVENOUS
  Filled 2015-11-04: qty 2

## 2015-11-04 MED ORDER — SODIUM CHLORIDE 0.9 % IV BOLUS (SEPSIS)
1000.0000 mL | Freq: Once | INTRAVENOUS | Status: AC
  Administered 2015-11-04: 1000 mL via INTRAVENOUS

## 2015-11-04 NOTE — ED Notes (Signed)
Pt in bathroom for urine sample

## 2015-11-04 NOTE — ED Notes (Signed)
MD at bedside. 

## 2015-11-04 NOTE — ED Provider Notes (Signed)
CSN: [id number]     Arrival date & time 11/04/15  0935 History   First MD Initiated Contact with Patient 11/04/15 1119     Chief Complaint  Patient presents with  . Headache     (Consider location/radiation/quality/duration/timing/severity/associated sxs/prior Treatment) HPI  30 year old female who presents with headache. Although on chart review she does have history of migraine headaches, she states that she has not had any significant prior history of headaches. States that she has had nearly daily headaches over the course of the past 3 weeks. I was seen in the emergency department on December 17 of her headaches and symptoms of sinus congestion. Was treated with oral pain medications, with some relief of symptoms, and she has also finished a course of antibiotics, but states that 1 week ago had recurrence of her headaches. Headache initially mild, gradually worsening over the course of the past week. Currently 7 out of 10 in severity. Headache is left-sided, retro-orbital, and associated with photophobia. Has nausea but no vomiting. No double vision, speech changes, vomiting, numbness or weakness, or difficulty walking. No recent fevers or chills. No cough, congestion, sore throat, or other respiratory symptoms.  Past Medical History  Diagnosis Date  . No pertinent past medical history   . Abnormal Pap smear   . Anemia   . Anxiety     worse when on meds  . Vaginal Pap smear, abnormal     did bx, ok since   Past Surgical History  Procedure Laterality Date  . Cesarean section     Family History  Problem Relation Age of Onset  . Alcohol abuse Neg Hx   . Arthritis Neg Hx   . Asthma Neg Hx   . Birth defects Neg Hx   . Cancer Neg Hx   . COPD Neg Hx   . Depression Neg Hx   . Diabetes Neg Hx   . Drug abuse Neg Hx   . Early death Neg Hx   . Hearing loss Neg Hx   . Heart disease Neg Hx   . Hyperlipidemia Neg Hx   . Hypertension Neg Hx   . Kidney disease Neg Hx   . Learning  disabilities Neg Hx   . Mental illness Neg Hx   . Mental retardation Neg Hx   . Miscarriages / Stillbirths Neg Hx   . Stroke Neg Hx   . Vision loss Neg Hx    Social History  Substance Use Topics  . Smoking status: Never Smoker   . Smokeless tobacco: Never Used  . Alcohol Use: No   OB History    Gravida Para Term Preterm AB TAB SAB Ectopic Multiple Living   1 1 1       1      Review of Systems  10/14 systems reviewed and are negative other than those stated in the HPI   Allergies  Compazine and Reglan  Home Medications   Prior to Admission medications   Medication Sig Start Date End Date Taking? Authorizing Provider  acetaminophen (TYLENOL) 500 MG tablet Take 500 mg by mouth every 6 (six) hours as needed for headache.   Yes Historical Provider, MD  ibuprofen (ADVIL,MOTRIN) 200 MG tablet Take 400 mg by mouth every 6 (six) hours as needed for moderate pain.   Yes Historical Provider, MD  ibuprofen (ADVIL,MOTRIN) 800 MG tablet Take 1 tablet (800 mg total) by mouth 3 (three) times daily. Patient taking differently: Take 800 mg by mouth every 6 (six) hours as  needed for fever or moderate pain.  10/15/15  Yes Shawn C Joy, PA-C  promethazine (PHENERGAN) 25 MG tablet Take 1 tablet (25 mg total) by mouth every 6 (six) hours as needed for nausea or vomiting. 10/15/15  Yes Shawn C Joy, PA-C  amoxicillin-clavulanate (AUGMENTIN) 875-125 MG tablet Take 1 tablet by mouth every 12 (twelve) hours. Patient not taking: Reported on 11/04/2015 10/15/15   Hillard DankerShawn C Joy, PA-C  dextromethorphan-guaiFENesin (MUCINEX DM) 30-600 MG 12hr tablet Take 1 tablet by mouth 2 (two) times daily. Patient not taking: Reported on 11/04/2015 10/15/15   Shawn C Joy, PA-C   BP 111/78 mmHg  Pulse 68  Temp(Src) 98.1 F (36.7 C) (Oral)  Resp 18  SpO2 100%  LMP 10/15/2015 (Exact Date) Physical Exam Physical Exam  Nursing note and vitals reviewed. Constitutional: Well developed, well nourished, non-toxic, and in no acute  distress Head: Normocephalic and atraumatic.  Mouth/Throat: Oropharynx is clear and moist.  Neck: Normal range of motion. Neck supple. No meningismus Cardiovascular: Normal rate and regular rhythm.   Pulmonary/Chest: Effort normal and breath sounds normal.  Abdominal: Soft. There is no tenderness. There is no rebound and no guarding.  Musculoskeletal: Normal range of motion.  Neurological:  Alert, oriented to person, place, time, and situation. Memory grossly in tact. Fluent speech. No dysarthria or aphasia.  Cranial nerves: VF are full.  Pupils are symmetric, and reactive to light. EOMI without nystagmus. No gaze deviation. Facial muscles symmetric with activation. Sensation to light touch over face in tact bilaterally. Hearing grossly in tact. Palate elevates symmetrically. Head turn and shoulder shrug are intact. Tongue midline.  Reflexes defered.  Muscle bulk and tone normal. No pronator drift. Moves all extremities symmetrically. Sensation to light touch is in tact throughout in bilateral upper and lower extremities. Coordination reveals no dysmetria with finger to nose. Gait is narrow-based and steady. Non-ataxic.  Skin: Skin is warm and dry.  Psychiatric: Cooperative  ED Course  Procedures (including critical care time) Labs Review Labs Reviewed  PREGNANCY, URINE    Imaging Review Ct Head Wo Contrast  11/04/2015  CLINICAL DATA:  Left-sided headache and left-sided neck pain, 3 weeks duration. EXAM: CT HEAD WITHOUT CONTRAST TECHNIQUE: Contiguous axial images were obtained from the base of the skull through the vertex without intravenous contrast. COMPARISON:  11/12/2012 FINDINGS: There is a low-density extra-axial abnormality in the left side of the posterior fossa which measures 13 mm today as opposed to 7 mm previously. This is most consistent with an epidermoid. It may be incidental, but appears to be enlarging and probably should be evaluated with MRI with and without contrast.  The cerebral hemispheres are normal. No evidence of intra-axial mass lesion, old or acute infarction, hemorrhage, hydrocephalus or subdural collection. The calvarium is unremarkable. Sinuses, middle ears and mastoids are clear. IMPRESSION: No acute finding. Enlarging low-density extra-axial abnormality in the left side of the posterior fossa indenting the cerebellum which probably represents an epidermoid. MRI with and without contrast recommended for more accurate characterization. Electronically Signed   By: Paulina FusiMark  Shogry M.D.   On: 11/04/2015 12:06   I have personally reviewed and evaluated these images and lab results as part of my medical decision-making.   EKG Interpretation None      MDM   Final diagnoses:  Acute nonintractable headache, unspecified headache type  Epidermoid cyst of brain    30 year old female who presents with an atypical headache for the past 3 weeks. She is well-appearing and in no acute  distress. Vital signs within normal limits. She is neurologically intact. Presentation consistent with likely migraine headache, but patient states this is very atypical for her to have this recurrent for this long. CT head performed showing epidermoid cyst which she has had before, with enlargement compared to last CT. No other acute findings. Will give referral to Neurology for follow-up and MRI imaging as needed. Her symptoms resolve after toradol, compazine, and benadryl with IVF. States that she is ready to be discharged. Strict return and follow-up instructions reviewed. She expressed understanding of all discharge instructions and felt comfortable with the plan of care.    Lavera Guise, MD 11/04/15 [phone]

## 2015-11-04 NOTE — Discharge Instructions (Signed)
Return for worsening symptoms, including confusion, worsening pain, vomiting and unable to keep down food/fluids, or any other symptoms concerning to you.  You are given referral to neurology for follow-up of your recurrent migraine headaches and your cyst.   Migraine Headache A migraine headache is an intense, throbbing pain on one or both sides of your head. A migraine can last for 30 minutes to several hours. CAUSES  The exact cause of a migraine headache is not always known. However, a migraine may be caused when nerves in the brain become irritated and release chemicals that cause inflammation. This causes pain. Certain things may also trigger migraines, such as:  Alcohol.  Smoking.  Stress.  Menstruation.  Aged cheeses.  Foods or drinks that contain nitrates, glutamate, aspartame, or tyramine.  Lack of sleep.  Chocolate.  Caffeine.  Hunger.  Physical exertion.  Fatigue.  Medicines used to treat chest pain (nitroglycerine), birth control pills, estrogen, and some blood pressure medicines. SIGNS AND SYMPTOMS  Pain on one or both sides of your head.  Pulsating or throbbing pain.  Severe pain that prevents daily activities.  Pain that is aggravated by any physical activity.  Nausea, vomiting, or both.  Dizziness.  Pain with exposure to bright lights, loud noises, or activity.  General sensitivity to bright lights, loud noises, or smells. Before you get a migraine, you may get warning signs that a migraine is coming (aura). An aura may include:  Seeing flashing lights.  Seeing bright spots, halos, or zigzag lines.  Having tunnel vision or blurred vision.  Having feelings of numbness or tingling.  Having trouble talking.  Having muscle weakness. DIAGNOSIS  A migraine headache is often diagnosed based on:  Symptoms.  Physical exam.  A CT scan or MRI of your head. These imaging tests cannot diagnose migraines, but they can help rule out other  causes of headaches. TREATMENT Medicines may be given for pain and nausea. Medicines can also be given to help prevent recurrent migraines.  HOME CARE INSTRUCTIONS  Only take over-the-counter or prescription medicines for pain or discomfort as directed by your health care provider. The use of long-term narcotics is not recommended.  Lie down in a dark, quiet room when you have a migraine.  Keep a journal to find out what may trigger your migraine headaches. For example, write down:  What you eat and drink.  How much sleep you get.  Any change to your diet or medicines.  Limit alcohol consumption.  Quit smoking if you smoke.  Get 7-9 hours of sleep, or as recommended by your health care provider.  Limit stress.  Keep lights dim if bright lights bother you and make your migraines worse. SEEK IMMEDIATE MEDICAL CARE IF:   Your migraine becomes severe.  You have a fever.  You have a stiff neck.  You have vision loss.  You have muscular weakness or loss of muscle control.  You start losing your balance or have trouble walking.  You feel faint or pass out.  You have severe symptoms that are different from your first symptoms. MAKE SURE YOU:   Understand these instructions.  Will watch your condition.  Will get help right away if you are not doing well or get worse.   This information is not intended to replace advice given to you by your health care provider. Make sure you discuss any questions you have with your health care provider.   Document Released: 10/15/2005 Document Revised: 11/05/2014 Document Reviewed: 06/22/2013 Elsevier  Interactive Patient Education [employer].

## 2015-11-04 NOTE — ED Notes (Signed)
Pt came to nurse station, inquiring about discharge.

## 2015-11-04 NOTE — ED Notes (Signed)
Boyfriend at bedside to take patient home.

## 2015-11-04 NOTE — ED Notes (Signed)
Pt c/o L sided headache and L side neck pain x 3 weeks.  Pain score 7/10.  Pt reports taking tylenol w/ little relief.  [facility] and diagnosed w/ a sinus infection.  Pt reports symptoms resolved for a short amount of time after antibiotic therapy.  + Lightsensitivity  Denies blurred vision.

## 2015-11-04 NOTE — ED Notes (Signed)
Pt cannot use restroom at this time, aware urine specimen is needed.  

## 2015-11-22 ENCOUNTER — Encounter (HOSPITAL_COMMUNITY): Payer: Self-pay | Admitting: *Deleted

## 2015-11-22 ENCOUNTER — Inpatient Hospital Stay (HOSPITAL_COMMUNITY)
Admission: AD | Admit: 2015-11-22 | Discharge: 2015-11-22 | Disposition: A | Discharge status: Home / Self Care | Payer: Medicaid Other | Source: Ambulatory Visit | Attending: Obstetrics and Gynecology | Admitting: Obstetrics and Gynecology

## 2015-11-22 DIAGNOSIS — N898 Other specified noninflammatory disorders of vagina: Secondary | ICD-10-CM | POA: Diagnosis present

## 2015-11-22 DIAGNOSIS — Z3202 Encounter for pregnancy test, result negative: Secondary | ICD-10-CM | POA: Diagnosis not present

## 2015-11-22 DIAGNOSIS — B373 Candidiasis of vulva and vagina: Secondary | ICD-10-CM | POA: Diagnosis not present

## 2015-11-22 DIAGNOSIS — B3731 Acute candidiasis of vulva and vagina: Secondary | ICD-10-CM

## 2015-11-22 LAB — URINALYSIS, ROUTINE W REFLEX MICROSCOPIC
BILIRUBIN URINE: NEGATIVE
Glucose, UA: NEGATIVE mg/dL
KETONES UR: NEGATIVE mg/dL
NITRITE: NEGATIVE
PROTEIN: NEGATIVE mg/dL
SPECIFIC GRAVITY, URINE: 1.02 (ref 1.005–1.030)
pH: 5.5 (ref 5.0–8.0)

## 2015-11-22 LAB — URINE MICROSCOPIC-ADD ON

## 2015-11-22 LAB — WET PREP, GENITAL
Clue Cells Wet Prep HPF POC: NONE SEEN
Sperm: NONE SEEN
Trich, Wet Prep: NONE SEEN

## 2015-11-22 LAB — POCT PREGNANCY, URINE: PREG TEST UR: NEGATIVE

## 2015-11-22 MED ORDER — FLUCONAZOLE 150 MG PO TABS: 150.0000 mg | ORAL_TABLET | Freq: Every day | ORAL | Status: DC

## 2015-11-22 NOTE — Discharge Instructions (Signed)
Take 1 diflucan pill today. If symptoms don't improve in 3 days, have refilled & take another diflucan pill.    Monilial Vaginitis Vaginitis in a soreness, swelling and redness (inflammation) of the vagina and vulva. Monilial vaginitis is not a sexually transmitted infection. CAUSES  Yeast vaginitis is caused by yeast (candida) that is normally found in your vagina. With a yeast infection, the candida has overgrown in number to a point that upsets the chemical balance. SYMPTOMS   White, thick vaginal discharge.  Swelling, itching, redness and irritation of the vagina and possibly the lips of the vagina (vulva).  Burning or painful urination.  Painful intercourse. DIAGNOSIS  Things that may contribute to monilial vaginitis are:  Postmenopausal and virginal states.  Pregnancy.  Infections.  Being tired, sick or stressed, especially if you had monilial vaginitis in the past.  Diabetes. Good control will help lower the chance.  Birth control pills.  Tight fitting garments.  Using bubble bath, feminine sprays, douches or deodorant tampons.  Taking certain medications that kill germs (antibiotics).  Sporadic recurrence can occur if you become ill. TREATMENT  Your caregiver will give you medication.  There are several kinds of anti monilial vaginal creams and suppositories specific for monilial vaginitis. For recurrent yeast infections, use a suppository or cream in the vagina 2 times a week, or as directed.  Anti-monilial or steroid cream for the itching or irritation of the vulva may also be used. Get your caregiver's permission.  Painting the vagina with methylene blue solution may help if the monilial cream does not work.  Eating yogurt may help prevent monilial vaginitis. HOME CARE INSTRUCTIONS   Finish all medication as prescribed.  Do not have sex until treatment is completed or after your caregiver tells you it is okay.  Take warm sitz baths.  Do not  douche.  Do not use tampons, especially scented ones.  Wear cotton underwear.  Avoid tight pants and panty hose.  Tell your sexual partner that you have a yeast infection. They should go to their caregiver if they have symptoms such as mild rash or itching.  Your sexual partner should be treated as well if your infection is difficult to eliminate.  Practice safer sex. Use condoms.  Some vaginal medications cause latex condoms to fail. Vaginal medications that harm condoms are:  Cleocin cream.  Butoconazole (Femstat).  Terconazole (Terazol) vaginal suppository.  Miconazole (Monistat) (may be purchased over the counter). SEEK MEDICAL CARE IF:   You have a temperature by mouth above 102 F (38.9 C).  The infection is getting worse after 2 days of treatment.  The infection is not getting better after 3 days of treatment.  You develop blisters in or around your vagina.  You develop vaginal bleeding, and it is not your menstrual period.  You have pain when you urinate.  You develop intestinal problems.  You have pain with sexual intercourse.   This information is not intended to replace advice given to you by your health care provider. Make sure you discuss any questions you have with your health care provider.   Document Released: 07/25/2005 Document Revised: 01/07/2012 Document Reviewed: 04/18/2015 Elsevier Interactive Patient Education [employer]

## 2015-11-22 NOTE — MAU Provider Note (Signed)
History     CSN: [id number]  Arrival date and time: 11/22/15 1307   First Provider Initiated Contact with Patient 11/22/15 1409       Chief Complaint  Patient presents with  . Vaginal Itching   Vaginal Itching The patient's primary symptoms include genital itching and vaginal discharge. The patient's pertinent negatives include no genital odor, missed menses or vaginal bleeding. Episode onset: x 1 week. The problem occurs constantly. The problem has been unchanged. She is not pregnant. Pertinent negatives include no abdominal pain, constipation, diarrhea, dysuria, fever, nausea, painful intercourse or vomiting. The vaginal discharge was thick and white. There has been no bleeding. The symptoms are aggravated by urinating. She has tried nothing for the symptoms. She is sexually active. No, her partner does not have an STD. She uses condoms for contraception. Her menstrual history has been regular.    OB History    Gravida Para Term Preterm AB TAB SAB Ectopic Multiple Living   Past Medical History  Diagnosis Date  . Anemia   . Anxiety     worse when on meds  . Vaginal Pap smear, abnormal     did bx, ok since    Past Surgical History  Procedure Laterality Date  . Cesarean section      Family History  Problem Relation Age of Onset  . Alcohol abuse Neg Hx   . Arthritis Neg Hx   . Asthma Neg Hx   . Birth defects Neg Hx   . Cancer Neg Hx   . COPD Neg Hx   . Depression Neg Hx   . Diabetes Neg Hx   . Drug abuse Neg Hx   . Early death Neg Hx   . Hearing loss Neg Hx   . Heart disease Neg Hx   . Hyperlipidemia Neg Hx   . Hypertension Neg Hx   . Kidney disease Neg Hx   . Learning disabilities Neg Hx   . Mental illness Neg Hx   . Mental retardation Neg Hx   . Miscarriages / Stillbirths Neg Hx   . Stroke Neg Hx   . Vision loss Neg Hx     Social History  Substance Use Topics  . Smoking status: Never Smoker   . Smokeless tobacco: Never Used  .  Alcohol Use: No    Allergies:  Allergies  Allergen Reactions  . Compazine [Prochlorperazine Edisylate] Anxiety  . Reglan [Metoclopramide] Anxiety    Prescriptions prior to admission  Medication Sig Dispense Refill Last Dose  . ALPRAZolam (XANAX) 0.5 MG tablet Take 0.5 mg by mouth as needed for anxiety.   11/21/2015 at Unknown time  . traZODone (DESYREL) 50 MG tablet Take 50 mg by mouth at bedtime.   11/21/2015 at Unknown time    Review of Systems  Constitutional: Negative for fever.  Gastrointestinal: Negative for nausea, vomiting, abdominal pain, diarrhea and constipation.  Genitourinary: Positive for vaginal discharge. Negative for dysuria and missed menses.   Physical Exam   Blood pressure 105/61, pulse 65, temperature 97.5 F (36.4 C), resp. rate 18, height  (1.575 m), weight 167 lb (75.751 kg), last menstrual period 11/07/2015.  Physical Exam  Nursing note and vitals reviewed. Constitutional: She is oriented to person, place, and time. She appears well-developed and well-nourished. No distress.  HENT:  Head: Normocephalic and atraumatic.  Eyes: Conjunctivae are normal. Right eye exhibits no discharge. Left eye exhibits  no discharge. No scleral icterus.  Neck: Normal range of motion.  Cardiovascular: Normal rate, regular rhythm and normal heart sounds.   No murmur heard. Respiratory: Effort normal and breath sounds normal. No respiratory distress. She has no wheezes.  GI: Soft.  Genitourinary: Cervix exhibits discharge (moderate amount of thick white discharge adherent to vaginal wall & cervix). Cervix exhibits no motion tenderness and no friability.  Neurological: She is alert and oriented to person, place, and time.  Skin: Skin is warm and dry. She is not diaphoretic.  Psychiatric: She has a normal mood and affect. Her behavior is normal. Judgment and thought content normal.    MAU Course  Procedures Results for orders placed or performed during the hospital  encounter of 11/22/15 (from the past 24 hour(s))  Urinalysis, Routine w reflex microscopic (not at [facility])     Status: Abnormal   Collection Time: 11/22/15  1:37 PM  Result Value Ref Range   Color, Urine YELLOW YELLOW   APPearance CLEAR CLEAR   Specific Gravity, Urine 1.020 1.005 - 1.030   pH 5.5 5.0 - 8.0   Glucose, UA NEGATIVE NEGATIVE mg/dL   Hgb urine dipstick TRACE (A) NEGATIVE   Bilirubin Urine NEGATIVE NEGATIVE   Ketones, ur NEGATIVE NEGATIVE mg/dL   Protein, ur NEGATIVE NEGATIVE mg/dL   Nitrite NEGATIVE NEGATIVE   Leukocytes, UA TRACE (A) NEGATIVE  Urine microscopic-add on     Status: Abnormal   Collection Time: 11/22/15  1:37 PM  Result Value Ref Range   Squamous Epithelial / LPF 0-5 (A) NONE SEEN   WBC, UA 0-5 0 - 5 WBC/hpf   RBC / HPF 0-5 0 - 5 RBC/hpf   Bacteria, UA MANY (A) NONE SEEN   Urine-Other MUCOUS PRESENT   Pregnancy, urine POC     Status: None   Collection Time: 11/22/15  2:11 PM  Result Value Ref Range   Preg Test, Ur NEGATIVE NEGATIVE  Wet prep, genital     Status: Abnormal   Collection Time: 11/22/15  2:20 PM  Result Value Ref Range   Yeast Wet Prep HPF POC PRESENT (A) NONE SEEN   Trich, Wet Prep NONE SEEN NONE SEEN   Clue Cells Wet Prep HPF POC NONE SEEN NONE SEEN   WBC, Wet Prep HPF POC MANY (A) NONE SEEN   Sperm NONE SEEN     MDM UPT negative Will treat for yeast based on presentation & wet prep Assessment and Plan  A: 1. Vaginal yeast infection    P: Discharge home Rx diflucan x 1 refill GC/CT pending  Judeth Horn, NP  11/22/2015, 2:09 PM

## 2015-11-22 NOTE — MAU Note (Signed)
Pt states has been having vaginal itching with thick white discharge that started 1 week ago.

## 2015-11-23 LAB — GC/CHLAMYDIA PROBE AMP (~~LOC~~) NOT AT ARMC
Chlamydia: NEGATIVE
Neisseria Gonorrhea: NEGATIVE

## 2015-12-01 ENCOUNTER — Ambulatory Visit (INDEPENDENT_AMBULATORY_CARE_PROVIDER_SITE_OTHER): Payer: Medicaid Other | Admitting: Neurology

## 2015-12-01 ENCOUNTER — Encounter: Payer: Self-pay | Admitting: Neurology

## 2015-12-01 VITALS — BP 106/70 | Ht 62.0 in | Wt 166.2 lb

## 2015-12-01 DIAGNOSIS — G930 Cerebral cysts: Secondary | ICD-10-CM | POA: Diagnosis not present

## 2015-12-01 NOTE — Progress Notes (Signed)
NEUROLOGY CONSULTATION NOTE  Kellie Davila MRN: [id number] DOB: [birth year]  Referring provider: ED referral Primary care provider: Lerry Liner  Reason for consult:  Cerebellar cyst  HISTORY OF PRESENT ILLNESS: Kellie Davila is a 30 year old right-handed female who presents for cerebellar cyst.  History obtained by patient and ED notes.  Imaging of head CT from 2014 and 2017 reviewed.  Sh presented to the ED on 11/04/15 for migraine headache.  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  It was larger, about 13 mm, compared to prior CT of head from 11/12/12, in which the cyst appeared to be 7 mm.  She is concerned about the growth in size.  Headaches have resolved.  She denies facial numbness, tinnitus, hearing loss and vertigo.  She does not have facial weakness.  PAST MEDICAL HISTORY: Past Medical History  Diagnosis Date  . Anemia   . Anxiety     worse when on meds  . Vaginal Pap smear, abnormal     did bx, ok since    PAST SURGICAL HISTORY: Past Surgical History  Procedure Laterality Date  . Cesarean section      MEDICATIONS: Current Outpatient Prescriptions on File Prior to Visit  Medication Sig Dispense Refill  . ALPRAZolam (XANAX) 0.5 MG tablet Take 0.5 mg by mouth as needed for anxiety.    . traZODone (DESYREL) 50 MG tablet Take 50 mg by mouth at bedtime.    . [DISCONTINUED] cetirizine (ZYRTEC) 10 MG tablet Take 1 tablet (10 mg total) by mouth daily as needed for allergies. (Patient not taking: Reported on 12/27/2014) 30 tablet 0   No current facility-administered medications on file prior to visit.    ALLERGIES: Allergies  Allergen Reactions  . Compazine [Prochlorperazine Edisylate] Anxiety  . Reglan [Metoclopramide] Anxiety    FAMILY HISTORY: Family History  Problem Relation Age of Onset  . Alcohol abuse Neg Hx   . Arthritis Neg Hx     . Asthma Neg Hx   . Birth defects Neg Hx   . Cancer Neg Hx   . COPD Neg Hx   . Depression Neg Hx   . Diabetes Neg Hx   . Drug abuse Neg Hx   . Early death Neg Hx   . Hearing loss Neg Hx   . Heart disease Neg Hx   . Hyperlipidemia Neg Hx   . Hypertension Neg Hx   . Kidney disease Neg Hx   . Learning disabilities Neg Hx   . Mental illness Neg Hx   . Mental retardation Neg Hx   . Miscarriages / Stillbirths Neg Hx   . Stroke Neg Hx   . Vision loss Neg Hx     SOCIAL HISTORY: Social History   Social History  . Marital Status: Single    Spouse Name: N/A  . Number of Children: N/A  . Years of Education: N/A   Occupational History  . Not on file.   Social History Main Topics  . Smoking status: Never Smoker   . Smokeless tobacco: Never Used  . Alcohol Use: No  . Drug Use: No  . Sexual Activity: Yes    Birth Control/ Protection: Condom   Other Topics Concern  . Not on file   Social History Narrative    REVIEW OF SYSTEMS: Constitutional: No fevers, chills, or sweats, no generalized fatigue, change in appetite Eyes: No  visual changes, double vision, eye pain Ear, nose and throat: No hearing loss, ear pain, nasal congestion, sore throat Cardiovascular: No chest pain, palpitations Respiratory:  No shortness of breath at rest or with exertion, wheezes GastrointestinaI: No nausea, vomiting, diarrhea, abdominal pain, fecal incontinence Genitourinary:  No dysuria, urinary retention or frequency Musculoskeletal:  No neck pain, back pain Integumentary: No rash, pruritus, skin lesions Neurological: as above Psychiatric: No depression, insomnia, anxiety Endocrine: No palpitations, fatigue, diaphoresis, mood swings, change in appetite, change in weight, increased thirst Hematologic/Lymphatic:  No anemia, purpura, petechiae. Allergic/Immunologic: no itchy/runny eyes, nasal congestion, recent allergic reactions, rashes  PHYSICAL EXAM: Filed Vitals:   12/01/15 0951  BP:  106/70   General: No acute distress.  Patient appears well-groomed.  Head:  Normocephalic/atraumatic Eyes:  fundi unremarkable, without vessel changes, exudates, hemorrhages or papilledema. Neck: supple, no paraspinal tenderness, full range of motion Back: No paraspinal tenderness Heart: regular rate and rhythm Lungs: Clear to auscultation bilaterally. Vascular: No carotid bruits. Neurological Exam: Mental status: alert and oriented to person, place, and time, recent and remote memory intact, fund of knowledge intact, attention and concentration intact, speech fluent and not dysarthric, language intact. Cranial nerves: CN I: not tested CN II: pupils equal, round and reactive to light, visual fields intact, fundi unremarkable, without vessel changes, exudates, hemorrhages or papilledema. CN III, IV, VI:  full range of motion, no nystagmus, no ptosis CN V: facial sensation intact CN VII: upper and lower face symmetric CN VIII: hearing intact CN IX, X: gag intact, uvula midline CN XI: sternocleidomastoid and trapezius muscles intact CN XII: tongue midline Bulk & Tone: normal, no fasciculations. Motor:  5/5 throughout  Sensation: temperature and vibration sensation intact. Deep Tendon Reflexes:  2+ throughout, toes downgoing.  Finger to nose testing:  Without dysmetria.  Heel to shin:  Without dysmetria.  Gait:  Normal station and stride.  Able to turn and tandem walk. Romberg negative.  IMPRESSION: Posterior fossa cyst.  She exhibits no cranial nerve signs.  PLAN: 1.  Will get MRI of brain with and without contrast to better identify lesion 2.  Likely, will just monitor, repeating MRI in one year.  Follow up at that time.  Thank you for allowing me to take part in the care of this patient.  Shon Millet, DO  CC:  Lerry Liner

## 2015-12-01 NOTE — Patient Instructions (Signed)
To get a better look, we will get an MRI of the brain with and without contrast I will want to repeat the MRI of the brain with and without contrast to see if it continues to grow. Usually this is benign

## 2015-12-01 NOTE — Progress Notes (Signed)
Chart forwarded.  

## 2015-12-02 ENCOUNTER — Ambulatory Visit: Payer: Medicaid Other | Admitting: Obstetrics and Gynecology

## 2015-12-10 ENCOUNTER — Inpatient Hospital Stay: Admission: RE | Admit: 2015-12-10 | Payer: Medicaid Other | Source: Ambulatory Visit

## 2016-01-12 IMAGING — CR DG CHEST 2V
2 series · 2 of 2 positions shown · non-contrast
Comparison: Chest radiograph performed 11/16/2007

CLINICAL DATA: Acute onset of sore throat, chest tightness and
productive cough. Nasal congestion and body aches. Initial
encounter.

EXAM:
CHEST  2 VIEW

[w chest pa]
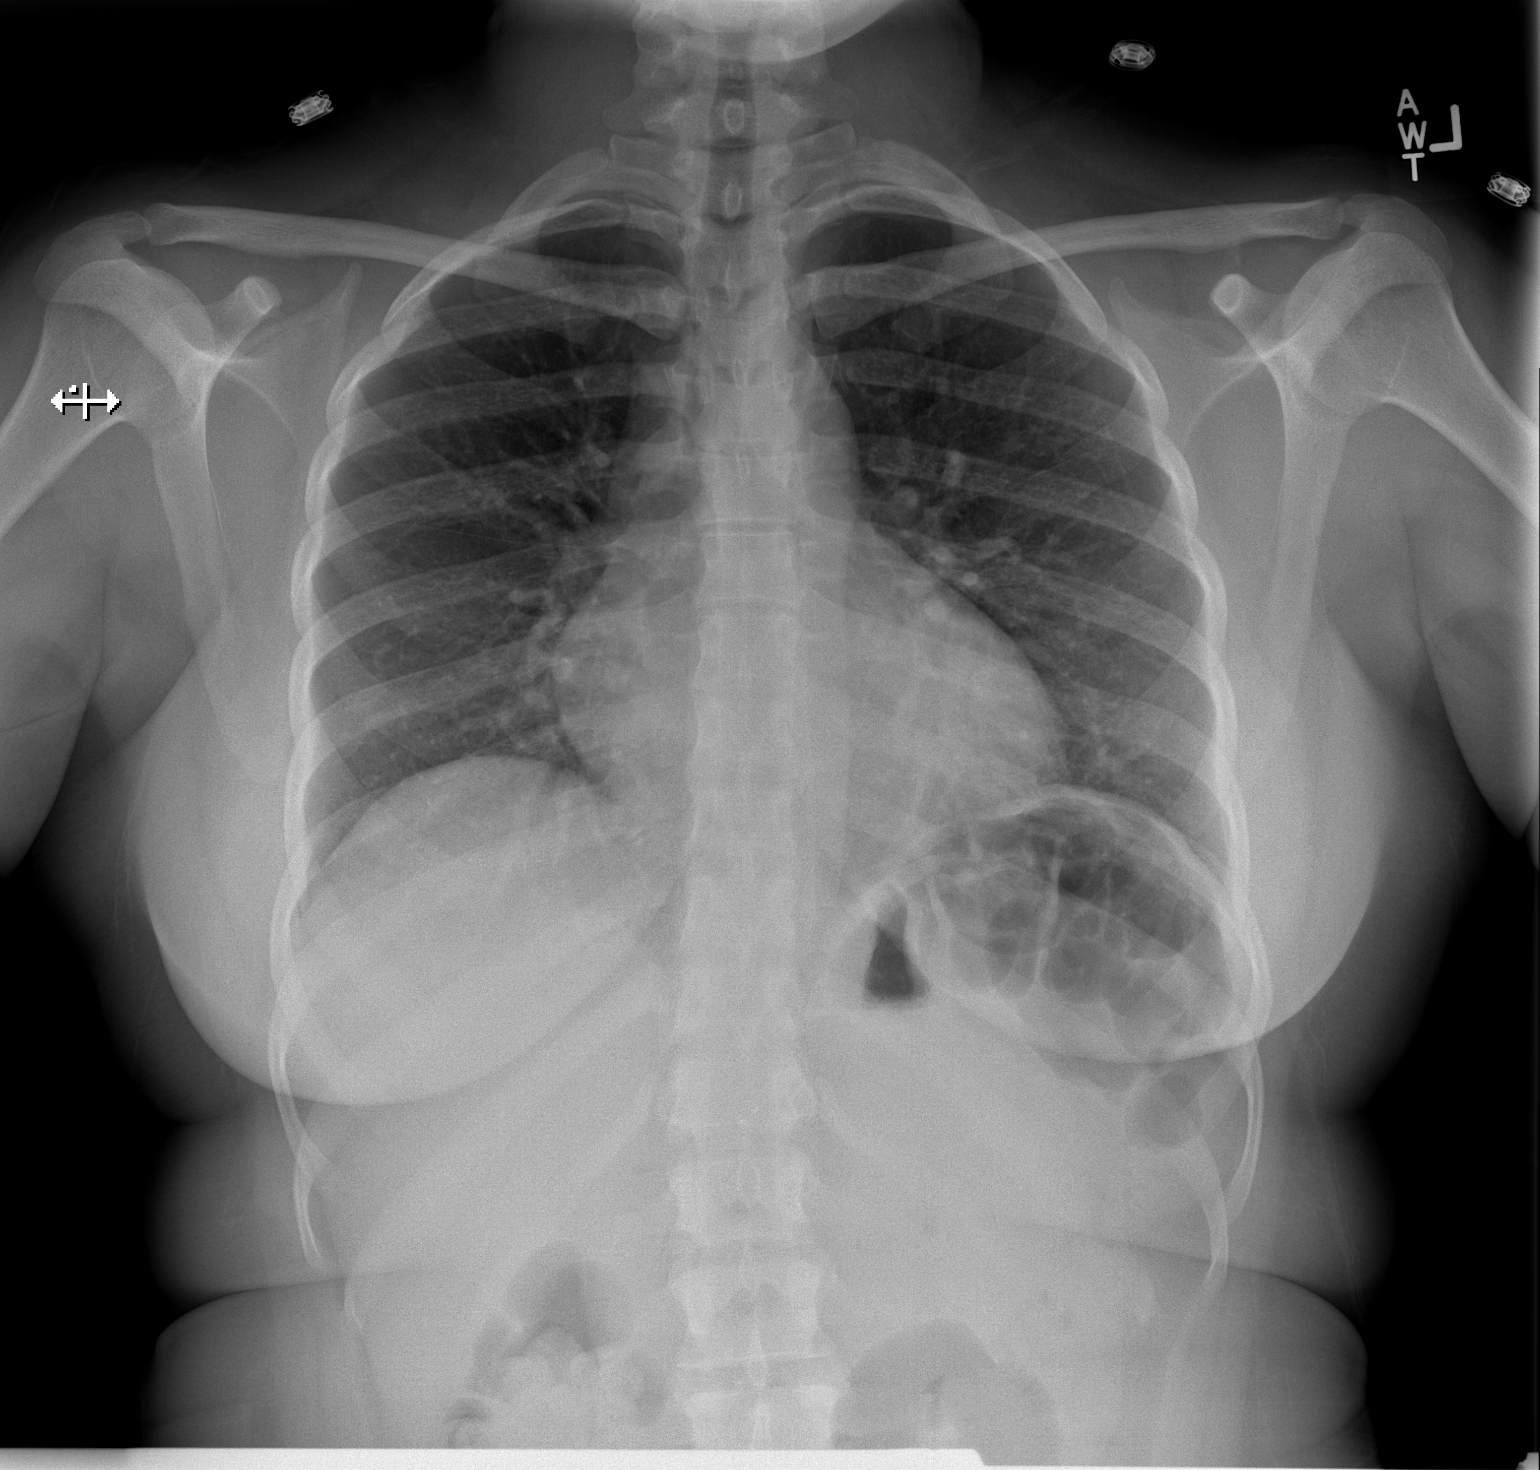

[w chest lat]
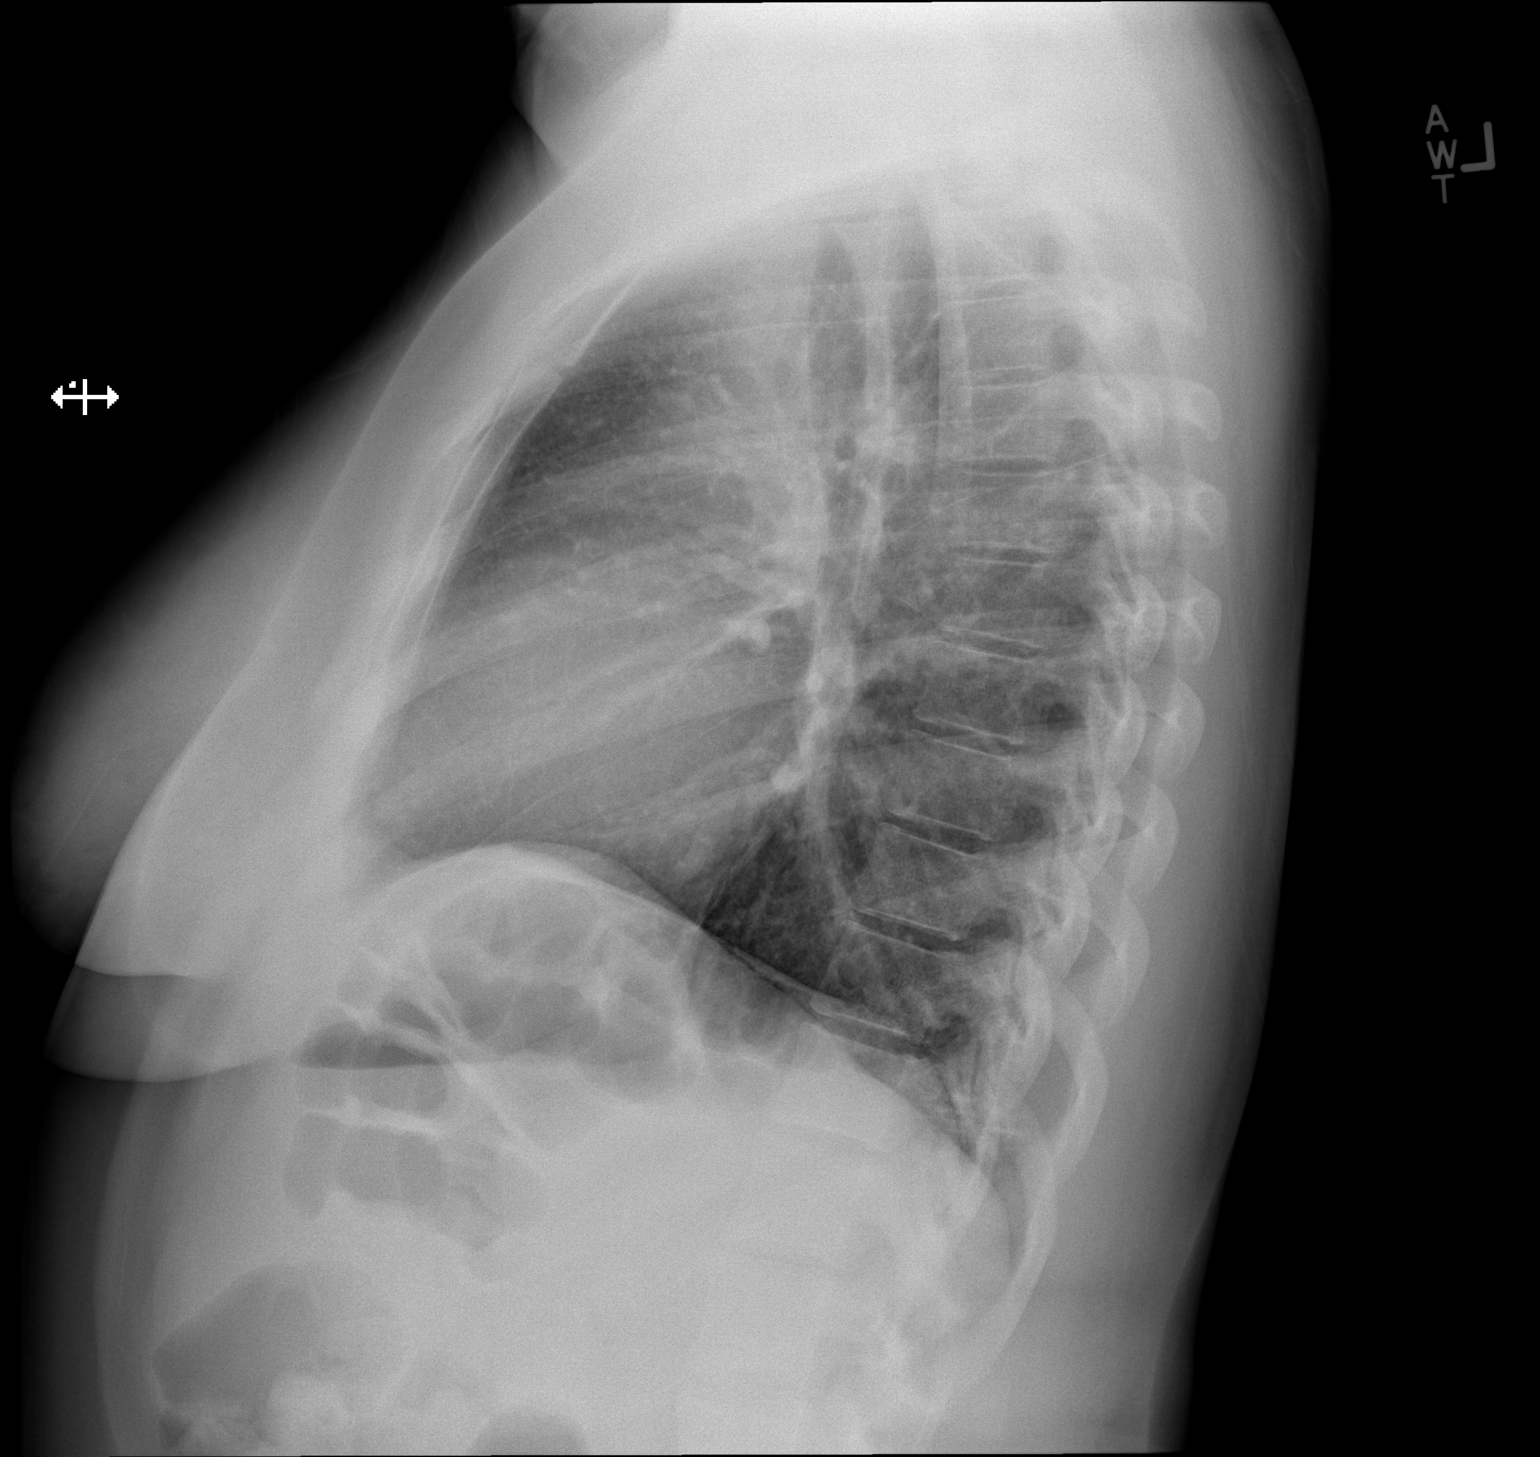

[2 of 2 positions shown; findings below may reference images not displayed]

FINDINGS: The lungs are well-aerated and clear. There is no evidence of focal
opacification, pleural effusion or pneumothorax.

The heart is borderline normal in size. No acute osseous
abnormalities are seen.
IMPRESSION: No acute cardiopulmonary process seen.

## 2016-01-23 ENCOUNTER — Inpatient Hospital Stay (EMERGENCY_DEPARTMENT_HOSPITAL)
Admission: AD | Admit: 2016-01-23 | Discharge: 2016-01-23 | Disposition: A | Discharge status: Home / Self Care | Payer: Medicaid Other | Source: Ambulatory Visit | Attending: Family Medicine | Admitting: Family Medicine

## 2016-01-23 ENCOUNTER — Encounter (HOSPITAL_COMMUNITY): Payer: Self-pay

## 2016-01-23 ENCOUNTER — Inpatient Hospital Stay (HOSPITAL_COMMUNITY)
Admission: AD | Admit: 2016-01-23 | Discharge: 2016-01-23 | Disposition: A | Discharge status: Home / Self Care | Payer: Medicaid Other | Source: Ambulatory Visit | Attending: Family Medicine | Admitting: Family Medicine

## 2016-01-23 ENCOUNTER — Encounter (HOSPITAL_COMMUNITY): Payer: Self-pay | Admitting: *Deleted

## 2016-01-23 ENCOUNTER — Inpatient Hospital Stay (HOSPITAL_COMMUNITY): Payer: Medicaid Other

## 2016-01-23 DIAGNOSIS — Z3A01 Less than 8 weeks gestation of pregnancy: Secondary | ICD-10-CM

## 2016-01-23 DIAGNOSIS — F419 Anxiety disorder, unspecified: Secondary | ICD-10-CM | POA: Diagnosis not present

## 2016-01-23 DIAGNOSIS — R109 Unspecified abdominal pain: Secondary | ICD-10-CM | POA: Insufficient documentation

## 2016-01-23 DIAGNOSIS — O9989 Other specified diseases and conditions complicating pregnancy, childbirth and the puerperium: Secondary | ICD-10-CM

## 2016-01-23 DIAGNOSIS — O26891 Other specified pregnancy related conditions, first trimester: Secondary | ICD-10-CM

## 2016-01-23 DIAGNOSIS — O99341 Other mental disorders complicating pregnancy, first trimester: Secondary | ICD-10-CM | POA: Insufficient documentation

## 2016-01-23 DIAGNOSIS — O26899 Other specified pregnancy related conditions, unspecified trimester: Secondary | ICD-10-CM

## 2016-01-23 DIAGNOSIS — O3680X0 Pregnancy with inconclusive fetal viability, not applicable or unspecified: Secondary | ICD-10-CM

## 2016-01-23 LAB — CBC
HEMATOCRIT: 32 % — AB (ref 36.0–46.0)
HEMOGLOBIN: 11.1 g/dL — AB (ref 12.0–15.0)
MCH: 28.6 pg (ref 26.0–34.0)
MCHC: 34.7 g/dL (ref 30.0–36.0)
MCV: 82.5 fL (ref 78.0–100.0)
Platelets: 334 10*3/uL (ref 150–400)
RBC: 3.88 MIL/uL (ref 3.87–5.11)
RDW: 14.3 % (ref 11.5–15.5)
WBC: 6.1 10*3/uL (ref 4.0–10.5)

## 2016-01-23 LAB — URINALYSIS, ROUTINE W REFLEX MICROSCOPIC
BILIRUBIN URINE: NEGATIVE
Glucose, UA: NEGATIVE mg/dL
KETONES UR: NEGATIVE mg/dL
LEUKOCYTES UA: NEGATIVE
NITRITE: NEGATIVE
PH: 6 (ref 5.0–8.0)
PROTEIN: NEGATIVE mg/dL
Specific Gravity, Urine: 1.01 (ref 1.005–1.030)

## 2016-01-23 LAB — WET PREP, GENITAL
CLUE CELLS WET PREP: NONE SEEN
SPERM: NONE SEEN
Trich, Wet Prep: NONE SEEN
Yeast Wet Prep HPF POC: NONE SEEN

## 2016-01-23 LAB — URINE MICROSCOPIC-ADD ON
RBC / HPF: NONE SEEN RBC/hpf (ref 0–5)
WBC UA: NONE SEEN WBC/hpf (ref 0–5)

## 2016-01-23 LAB — HCG, QUANTITATIVE, PREGNANCY: HCG, BETA CHAIN, QUANT, S: 1965 m[IU]/mL — AB (ref <5)

## 2016-01-23 LAB — POCT PREGNANCY, URINE: PREG TEST UR: POSITIVE — AB

## 2016-01-23 LAB — ABO/RH: ABO/RH(D): B POS

## 2016-01-23 MED ORDER — ACETAMINOPHEN 500 MG PO TABS
1000.0000 mg | ORAL_TABLET | Freq: Once | ORAL | Status: AC
  Administered 2016-01-23: 1000 mg via ORAL
  Filled 2016-01-23: qty 2

## 2016-01-23 NOTE — MAU Provider Note (Signed)
History     CSN: [id number]  Arrival date and time: 01/23/16 4098   First Provider Initiated Contact with Patient 01/23/16 1038      Chief Complaint  Patient presents with  . Abdominal Pain  . Back Pain  . Possible Pregnancy   HPI   Kellie Davila is a 30 y.o. female G2P1001 at [redacted]w[redacted]d presenting with complaints of abdominal pain and possible pregnancy. The Patient is certain of her LMP. She currently rates her abdominal pain 3/10 " Feels like menstrual cramp". The pain is constant. She took ibuprofen yesterday for the pain which helped some.  She denies vaginal bleeding.  OB History    Gravida Para Term Preterm AB TAB SAB Ectopic Multiple Living   Past Medical History  Diagnosis Date  . Anemia   . Anxiety     worse when on meds  . Vaginal Pap smear, abnormal     did bx, ok since    Past Surgical History  Procedure Laterality Date  . Cesarean section      Family History  Problem Relation Age of Onset  . Alcohol abuse Neg Hx   . Arthritis Neg Hx   . Asthma Neg Hx   . Birth defects Neg Hx   . Cancer Neg Hx   . COPD Neg Hx   . Depression Neg Hx   . Diabetes Neg Hx   . Drug abuse Neg Hx   . Early death Neg Hx   . Hearing loss Neg Hx   . Heart disease Neg Hx   . Hyperlipidemia Neg Hx   . Hypertension Neg Hx   . Kidney disease Neg Hx   . Learning disabilities Neg Hx   . Mental illness Neg Hx   . Mental retardation Neg Hx   . Miscarriages / Stillbirths Neg Hx   . Stroke Neg Hx   . Vision loss Neg Hx     Social History  Substance Use Topics  . Smoking status: Never Smoker   . Smokeless tobacco: Never Used  . Alcohol Use: No    Allergies:  Allergies  Allergen Reactions  . Compazine [Prochlorperazine Edisylate] Anxiety  . Reglan [Metoclopramide] Anxiety    Prescriptions prior to admission  Medication Sig Dispense Refill Last Dose  . Biotin 5000 MCG CAPS Take 1 capsule by mouth daily.   01/22/2016 at Unknown time  .  cetirizine (ZYRTEC) 10 MG tablet Take 10 mg by mouth daily as needed for allergies.   Past Week at Unknown time  . Cyanocobalamin (VITAMIN B-12) 5000 MCG TBDP Take 1 tablet by mouth daily.   01/22/2016 at Unknown time  . fluticasone (FLONASE) 50 MCG/ACT nasal spray Place 1 spray into both nostrils daily as needed for allergies or rhinitis.   01/22/2016 at Unknown time   Results for orders placed or performed during the hospital encounter of 01/23/16 (from the past 48 hour(s))  Urinalysis, Routine w reflex microscopic (not at [facility])     Status: Abnormal   Collection Time: 01/23/16  9:45 AM  Result Value Ref Range   Color, Urine YELLOW YELLOW   APPearance CLEAR CLEAR   Specific Gravity, Urine 1.010 1.005 - 1.030   pH 6.0 5.0 - 8.0   Glucose, UA NEGATIVE NEGATIVE mg/dL   Hgb urine dipstick TRACE (A) NEGATIVE   Bilirubin Urine NEGATIVE NEGATIVE   Ketones, ur NEGATIVE NEGATIVE mg/dL   Protein,  ur NEGATIVE NEGATIVE mg/dL   Nitrite NEGATIVE NEGATIVE   Leukocytes, UA NEGATIVE NEGATIVE  Urine microscopic-add on     Status: Abnormal   Collection Time: 01/23/16  9:45 AM  Result Value Ref Range   Squamous Epithelial / LPF 0-5 (A) NONE SEEN   WBC, UA NONE SEEN 0 - 5 WBC/hpf   RBC / HPF NONE SEEN 0 - 5 RBC/hpf   Bacteria, UA FEW (A) NONE SEEN   Urine-Other MUCOUS PRESENT   Pregnancy, urine POC     Status: Abnormal   Collection Time: 01/23/16  9:54 AM  Result Value Ref Range   Preg Test, Ur POSITIVE (A) NEGATIVE    Comment:        THE SENSITIVITY OF THIS METHODOLOGY IS >24 mIU/mL   Wet prep, genital     Status: Abnormal   Collection Time: 01/23/16 10:50 AM  Result Value Ref Range   Yeast Wet Prep HPF POC NONE SEEN NONE SEEN   Trich, Wet Prep NONE SEEN NONE SEEN   Clue Cells Wet Prep HPF POC NONE SEEN NONE SEEN   WBC, Wet Prep HPF POC MODERATE (A) NONE SEEN    Comment: MANY BACTERIA SEEN   Sperm NONE SEEN   CBC     Status: Abnormal   Collection Time: 01/23/16 11:03 AM  Result Value Ref  Range   WBC 6.1 4.0 - 10.5 K/uL   RBC 3.88 3.87 - 5.11 MIL/uL   Hemoglobin 11.1 (L) 12.0 - 15.0 g/dL   HCT 40.932.0 (L) 81.136.0 - 91.446.0 %   MCV 82.5 78.0 - 100.0 fL   MCH 28.6 26.0 - 34.0 pg   MCHC 34.7 30.0 - 36.0 g/dL   RDW 78.214.3 95.611.5 - 21.315.5 %   Platelets 334 150 - 400 K/uL  ABO/Rh     Status: None   Collection Time: 01/23/16 11:03 AM  Result Value Ref Range   ABO/RH(D) B POS   hCG, quantitative, pregnancy     Status: Abnormal   Collection Time: 01/23/16 11:03 AM  Result Value Ref Range   hCG, Beta Chain, Quant, S 1965 (H) <5 mIU/mL    Comment:          GEST. AGE      CONC.  (mIU/mL)   <=1 WEEK        5 - 50     2 WEEKS       50 - 500     3 WEEKS       100 - 10,000     4 WEEKS     1,000 - 30,000     5 WEEKS     3,500 - 115,000   6-8 WEEKS     12,000 - 270,000    12 WEEKS     15,000 - 220,000        FEMALE AND NON-PREGNANT FEMALE:     LESS THAN 5 mIU/mL    Results for orders placed or performed during the hospital encounter of 01/23/16 (from the past 48 hour(s))  Urinalysis, Routine w reflex microscopic (not at [facility])     Status: Abnormal   Collection Time: 01/23/16  9:45 AM  Result Value Ref Range   Color, Urine YELLOW YELLOW   APPearance CLEAR CLEAR   Specific Gravity, Urine 1.010 1.005 - 1.030   pH 6.0 5.0 - 8.0   Glucose, UA NEGATIVE NEGATIVE mg/dL   Hgb urine dipstick TRACE (A) NEGATIVE   Bilirubin Urine NEGATIVE NEGATIVE   Ketones,  ur NEGATIVE NEGATIVE mg/dL   Protein, ur NEGATIVE NEGATIVE mg/dL   Nitrite NEGATIVE NEGATIVE   Leukocytes, UA NEGATIVE NEGATIVE  Urine microscopic-add on     Status: Abnormal   Collection Time: 01/23/16  9:45 AM  Result Value Ref Range   Squamous Epithelial / LPF 0-5 (A) NONE SEEN   WBC, UA NONE SEEN 0 - 5 WBC/hpf   RBC / HPF NONE SEEN 0 - 5 RBC/hpf   Bacteria, UA FEW (A) NONE SEEN   Urine-Other MUCOUS PRESENT   Pregnancy, urine POC     Status: Abnormal   Collection Time: 01/23/16  9:54 AM  Result Value Ref Range   Preg Test, Ur  POSITIVE (A) NEGATIVE    Comment:        THE SENSITIVITY OF THIS METHODOLOGY IS >24 mIU/mL   Wet prep, genital     Status: Abnormal   Collection Time: 01/23/16 10:50 AM  Result Value Ref Range   Yeast Wet Prep HPF POC NONE SEEN NONE SEEN   Trich, Wet Prep NONE SEEN NONE SEEN   Clue Cells Wet Prep HPF POC NONE SEEN NONE SEEN   WBC, Wet Prep HPF POC MODERATE (A) NONE SEEN    Comment: MANY BACTERIA SEEN   Sperm NONE SEEN   CBC     Status: Abnormal   Collection Time: 01/23/16 11:03 AM  Result Value Ref Range   WBC 6.1 4.0 - 10.5 K/uL   RBC 3.88 3.87 - 5.11 MIL/uL   Hemoglobin 11.1 (L) 12.0 - 15.0 g/dL   HCT 16.1 (L) 09.6 - 04.5 %   MCV 82.5 78.0 - 100.0 fL   MCH 28.6 26.0 - 34.0 pg   MCHC 34.7 30.0 - 36.0 g/dL   RDW 40.9 81.1 - 91.4 %   Platelets 334 150 - 400 K/uL  ABO/Rh     Status: None   Collection Time: 01/23/16 11:03 AM  Result Value Ref Range   ABO/RH(D) B POS   hCG, quantitative, pregnancy     Status: Abnormal   Collection Time: 01/23/16 11:03 AM  Result Value Ref Range   hCG, Beta Chain, Quant, S 1965 (H) <5 mIU/mL    Comment:          GEST. AGE      CONC.  (mIU/mL)   <=1 WEEK        5 - 50     2 WEEKS       50 - 500     3 WEEKS       100 - 10,000     4 WEEKS     1,000 - 30,000     5 WEEKS     3,500 - 115,000   6-8 WEEKS     12,000 - 270,000    12 WEEKS     15,000 - 220,000        FEMALE AND NON-PREGNANT FEMALE:     LESS THAN 5 mIU/mL     US Ob Comp Less 14 Wks  01/23/2016  CLINICAL DATA:  Patient with abdominal pain for 3 days. Positive pregnancy test. EXAM: OBSTETRIC <14 WK [place] AND TRANSVAGINAL OB US TECHNIQUE: Both transabdominal and transvaginal ultrasound examinations were performed for complete evaluation of the gestation as well as the maternal uterus, adnexal regions, and pelvic cul-de-sac. Transvaginal technique was performed to assess early pregnancy. COMPARISON:  None. FINDINGS: Intrauterine gestational sac: Visualized/normal in shape. Yolk sac:  Not  present Embryo:  Not present Cardiac Activity: Not  present MSD: 2.7  mm   5 w   0  d Subchorionic hemorrhage:  None visualized. Maternal uterus/adnexae: Normal right and left ovaries. Trace free fluid in the pelvis. No adnexal mass. IMPRESSION: Probable early intrauterine gestational sac, but no yolk sac, fetal pole, or cardiac activity yet visualized. Recommend follow-up quantitative B-HCG levels and follow-up US in 14 days to confirm and assess viability. This recommendation follows SRU consensus guidelines: Diagnostic Criteria for Nonviable Pregnancy Early in the First Trimester. Malva Limes Med 2013; 469:6295-28. Electronically Signed   By: Annia Belt M.D.   On: 01/23/2016 12:19   US Ob Transvaginal  01/23/2016  CLINICAL DATA:  Patient with abdominal pain for 3 days. Positive pregnancy test. EXAM: OBSTETRIC <14 WK [place] AND TRANSVAGINAL OB US TECHNIQUE: Both transabdominal and transvaginal ultrasound examinations were performed for complete evaluation of the gestation as well as the maternal uterus, adnexal regions, and pelvic cul-de-sac. Transvaginal technique was performed to assess early pregnancy. COMPARISON:  None. FINDINGS: Intrauterine gestational sac: Visualized/normal in shape. Yolk sac:  Not present Embryo:  Not present Cardiac Activity: Not present MSD: 2.7  mm   5 w   0  d Subchorionic hemorrhage:  None visualized. Maternal uterus/adnexae: Normal right and left ovaries. Trace free fluid in the pelvis. No adnexal mass. IMPRESSION: Probable early intrauterine gestational sac, but no yolk sac, fetal pole, or cardiac activity yet visualized. Recommend follow-up quantitative B-HCG levels and follow-up US in 14 days to confirm and assess viability. This recommendation follows SRU consensus guidelines: Diagnostic Criteria for Nonviable Pregnancy Early in the First Trimester. Malva Limes Med 2013; 413:2440-10. Electronically Signed   By: Annia Belt M.D.   On: 01/23/2016 12:19    Review of Systems   Constitutional: Negative for fever and chills.  Gastrointestinal: Positive for abdominal pain. Negative for nausea and vomiting.  Genitourinary:       Denies vaginal bleeding   Musculoskeletal: Positive for back pain.   Physical Exam   Blood pressure 112/69, pulse 71, temperature 98.7 F (37.1 C), temperature source Oral, resp. rate 16, height  (1.575 m), weight 166 lb 6 oz (75.467 kg), last menstrual period 12/27/2015.  Physical Exam  Constitutional: She is oriented to person, place, and time. She appears well-developed and well-nourished. No distress.  HENT:  Head: Normocephalic.  Eyes: Pupils are equal, round, and reactive to light.  Respiratory: Effort normal.  GI: Soft. She exhibits no distension. There is no tenderness. There is no rebound.  Genitourinary:  Speculum exam: Vagina - Small amount of creamy discharge, no odor Cervix - No contact bleeding Bimanual exam: Cervix closed Uterus non tender, normal size Adnexa non tender, no masses bilaterally GC/Chlam, wet prep done Chaperone present for exam.  Musculoskeletal: Normal range of motion.  Neurological: She is alert and oriented to person, place, and time.  Skin: Skin is warm. She is not diaphoretic.  Psychiatric: Her behavior is normal.    MAU Course  Procedures  None  MDM  CBC [place] UA   Assessment and Plan   A:  1. Pregnancy of unknown anatomic location   2. Abdominal pain in pregnancy, antepartum     P:  Discharge home in stable condition Ectopic precautions Return in 2 days to the [facility] for follow up beta hcg level Return to MAU if symptoms worsen     Duane Lope, NP 01/23/2016 12:51 PM

## 2016-01-23 NOTE — Discharge Instructions (Signed)

## 2016-01-23 NOTE — MAU Provider Note (Signed)
History     CSN: [id number]  Arrival date and time: 01/23/16 2140   First Provider Initiated Contact with Patient 01/23/16 2204      Chief Complaint  Patient presents with  . Abdominal Pain  . Back Pain   HPI  Kellie Davila is a 30 y.o. G3P1011 at [redacted]w[redacted]d who presents to MAU today with complaint of suprapubic pain and right sided pain. The patient states that she was here earlier and evaluated for pain. She states that pain got worse around 2100 while at work. She rates pain at 7/10 now. She has not taken anything for pain. She denies bleeding, fever, N/V/D or constipation. She denies change with ambulation, however states that pain got worse when she got upset and work and went for a walk.   OB History    Gravida Para Term Preterm AB TAB SAB Ectopic Multiple Living   0 0 0 1      Past Medical History  Diagnosis Date  . Anemia   . Anxiety     worse when on meds  . Vaginal Pap smear, abnormal     did bx, ok since    Past Surgical History  Procedure Laterality Date  . Cesarean section      Family History  Problem Relation Age of Onset  . Alcohol abuse Neg Hx   . Arthritis Neg Hx   . Asthma Neg Hx   . Birth defects Neg Hx   . Cancer Neg Hx   . COPD Neg Hx   . Depression Neg Hx   . Diabetes Neg Hx   . Drug abuse Neg Hx   . Early death Neg Hx   . Hearing loss Neg Hx   . Heart disease Neg Hx   . Hyperlipidemia Neg Hx   . Hypertension Neg Hx   . Kidney disease Neg Hx   . Learning disabilities Neg Hx   . Mental illness Neg Hx   . Mental retardation Neg Hx   . Miscarriages / Stillbirths Neg Hx   . Stroke Neg Hx   . Vision loss Neg Hx     Social History  Substance Use Topics  . Smoking status: Never Smoker   . Smokeless tobacco: Never Used  . Alcohol Use: No    Allergies:  Allergies  Allergen Reactions  . Compazine [Prochlorperazine Edisylate] Anxiety  . Reglan [Metoclopramide] Anxiety    Prescriptions prior to admission  Medication  Sig Dispense Refill Last Dose  . Biotin 5000 MCG CAPS Take 1 capsule by mouth daily.   01/22/2016 at Unknown time  . cetirizine (ZYRTEC) 10 MG tablet Take 10 mg by mouth daily as needed for allergies.   Past Week at Unknown time  . Cyanocobalamin (VITAMIN B-12) 5000 MCG TBDP Take 1 tablet by mouth daily.   01/22/2016 at Unknown time  . fluticasone (FLONASE) 50 MCG/ACT nasal spray Place 1 spray into both nostrils daily as needed for allergies or rhinitis.   Past Week at Unknown time    Review of Systems  Constitutional: Negative for fever and malaise/fatigue.  Gastrointestinal: Positive for abdominal pain. Negative for nausea, vomiting, diarrhea and constipation.  Genitourinary: Negative for dysuria, urgency and frequency.       Neg - vaginal bleeding, discharge   Physical Exam   Blood pressure 102/62, pulse 90, temperature 98 F (36.7 C), temperature source Oral, resp. rate 18, height  (1.549 m), weight 165 lb 9.6  oz (75.116 kg), last menstrual period 12/27/2015, SpO2 100 %.  Physical Exam  Nursing note and vitals reviewed. Constitutional: She is oriented to person, place, and time. She appears well-developed and well-nourished. No distress.  HENT:  Head: Normocephalic and atraumatic.  Cardiovascular: Normal rate.   Respiratory: Effort normal.  GI: Soft. She exhibits no distension and no mass. There is tenderness (mild tenderness to palpation to the suprapubic region). There is no rebound, no guarding and no CVA tenderness.  Neurological: She is alert and oriented to person, place, and time.  Skin: Skin is warm and dry. No erythema.  Psychiatric: She has a normal mood and affect.   Results for orders placed or performed during the hospital encounter of 01/23/16 (from the past 24 hour(s))  Urinalysis, Routine w reflex microscopic (not at [facility])     Status: Abnormal   Collection Time: 01/23/16  9:45 AM  Result Value Ref Range   Color, Urine YELLOW YELLOW   APPearance CLEAR CLEAR    Specific Gravity, Urine 1.010 1.005 - 1.030   pH 6.0 5.0 - 8.0   Glucose, UA NEGATIVE NEGATIVE mg/dL   Hgb urine dipstick TRACE (A) NEGATIVE   Bilirubin Urine NEGATIVE NEGATIVE   Ketones, ur NEGATIVE NEGATIVE mg/dL   Protein, ur NEGATIVE NEGATIVE mg/dL   Nitrite NEGATIVE NEGATIVE   Leukocytes, UA NEGATIVE NEGATIVE  Urine microscopic-add on     Status: Abnormal   Collection Time: 01/23/16  9:45 AM  Result Value Ref Range   Squamous Epithelial / LPF 0-5 (A) NONE SEEN   WBC, UA NONE SEEN 0 - 5 WBC/hpf   RBC / HPF NONE SEEN 0 - 5 RBC/hpf   Bacteria, UA FEW (A) NONE SEEN   Urine-Other MUCOUS PRESENT   Pregnancy, urine POC     Status: Abnormal   Collection Time: 01/23/16  9:54 AM  Result Value Ref Range   Preg Test, Ur POSITIVE (A) NEGATIVE  Wet prep, genital     Status: Abnormal   Collection Time: 01/23/16 10:50 AM  Result Value Ref Range   Yeast Wet Prep HPF POC NONE SEEN NONE SEEN   Trich, Wet Prep NONE SEEN NONE SEEN   Clue Cells Wet Prep HPF POC NONE SEEN NONE SEEN   WBC, Wet Prep HPF POC MODERATE (A) NONE SEEN   Sperm NONE SEEN   CBC     Status: Abnormal   Collection Time: 01/23/16 11:03 AM  Result Value Ref Range   WBC 6.1 4.0 - 10.5 K/uL   RBC 3.88 3.87 - 5.11 MIL/uL   Hemoglobin 11.1 (L) 12.0 - 15.0 g/dL   HCT 16.1 (L) 09.6 - 04.5 %   MCV 82.5 78.0 - 100.0 fL   MCH 28.6 26.0 - 34.0 pg   MCHC 34.7 30.0 - 36.0 g/dL   RDW 40.9 81.1 - 91.4 %   Platelets 334 150 - 400 K/uL  ABO/Rh     Status: None   Collection Time: 01/23/16 11:03 AM  Result Value Ref Range   ABO/RH(D) B POS   hCG, quantitative, pregnancy     Status: Abnormal   Collection Time: 01/23/16 11:03 AM  Result Value Ref Range   hCG, Beta Chain, Quant, S 1965 (H) <5 mIU/mL    US Ob Comp Less 14 Wks  01/23/2016  CLINICAL DATA:  Patient with abdominal pain for 3 days. Positive pregnancy test. EXAM: OBSTETRIC <14 WK [place] AND TRANSVAGINAL OB US TECHNIQUE: Both transabdominal and transvaginal ultrasound  examinations were performed  for complete evaluation of the gestation as well as the maternal uterus, adnexal regions, and pelvic cul-de-sac. Transvaginal technique was performed to assess early pregnancy. COMPARISON:  None. FINDINGS: Intrauterine gestational sac: Visualized/normal in shape. Yolk sac:  Not present Embryo:  Not present Cardiac Activity: Not present MSD: 2.7  mm   5 w   0  d Subchorionic hemorrhage:  None visualized. Maternal uterus/adnexae: Normal right and left ovaries. Trace free fluid in the pelvis. No adnexal mass. IMPRESSION: Probable early intrauterine gestational sac, but no yolk sac, fetal pole, or cardiac activity yet visualized. Recommend follow-up quantitative B-HCG levels and follow-up US in 14 days to confirm and assess viability. This recommendation follows SRU consensus guidelines: Diagnostic Criteria for Nonviable Pregnancy Early in the First Trimester. Malva Limes Engl J Med 2013; 621:3086-57; 369:1443-51. Electronically Signed   By: Annia Beltrew  Davis M.D.   On: 01/23/2016 12:19   [place] Ob Transvaginal  01/23/2016  CLINICAL DATA:  Patient with abdominal pain for 3 days. Positive pregnancy test. EXAM: OBSTETRIC <14 WK US AND TRANSVAGINAL OB US TECHNIQUE: Both transabdominal and transvaginal ultrasound examinations were performed for complete evaluation of the gestation as well as the maternal uterus, adnexal regions, and pelvic cul-de-sac. Transvaginal technique was performed to assess early pregnancy. COMPARISON:  None. FINDINGS: Intrauterine gestational sac: Visualized/normal in shape. Yolk sac:  Not present Embryo:  Not present Cardiac Activity: Not present MSD: 2.7  mm   5 w   0  d Subchorionic hemorrhage:  None visualized. Maternal uterus/adnexae: Normal right and left ovaries. Trace free fluid in the pelvis. No adnexal mass. IMPRESSION: Probable early intrauterine gestational sac, but no yolk sac, fetal pole, or cardiac activity yet visualized. Recommend follow-up quantitative B-HCG levels and follow-up US  in 14 days to confirm and assess viability. This recommendation follows SRU consensus guidelines: Diagnostic Criteria for Nonviable Pregnancy Early in the First Trimester. Malva Limes Engl J Med 2013; 846:9629-52; 369:1443-51. Electronically Signed   By: Annia Beltrew  Davis M.D.   On: 01/23/2016 12:19    MAU Course  Procedures None  MDM Seen in MAU earlier today, had full work-up. US showed IUGS only.  Discussed patient with Dr. Adrian BlackwaterStinson. He agrees with plan for Tylenol today. No further work-up needed due to minimal change in presentation and full work-up earlier today.  1000 mg Tylenol given. Patient reports some improvement in pain.  Assessment and Plan  A: Pregnancy of unknown location Abdominal pain in pregnancy  P: Discharge home Tylenol PRN for pain advised Ectopic precautions discussed Patient advised to follow-up with [facility] in 2 days as planned for repeat hCG Patient may return to MAU as needed or if her condition were to change or worsen   Kellie LowensteinJulie N Nykira Reddix, PA-C  01/23/2016, 11:04 PM

## 2016-01-23 NOTE — MAU Note (Signed)
Pt states that she was seen earlier today. Started having RLQ pain and side pain around 9pm. Did not take anything for pain. Denies bleeding.

## 2016-01-23 NOTE — Discharge Instructions (Signed)

## 2016-01-23 NOTE — MAU Note (Addendum)
C/o lower abdominal cramping for past 3 days , also c/o lower back pain for past 3 day; wishes to find out how far along she is; tension noted between pt and FOB;

## 2016-01-23 NOTE — MAU Note (Signed)
Patient presents with +HPT taken last night c/o abdominal and back pain X 2-3 days. Denies bleeding or discharge.

## 2016-01-24 LAB — HIV ANTIBODY (ROUTINE TESTING W REFLEX): HIV Screen 4th Generation wRfx: NONREACTIVE

## 2016-01-24 LAB — GC/CHLAMYDIA PROBE AMP (~~LOC~~) NOT AT ARMC
Chlamydia: NEGATIVE
NEISSERIA GONORRHEA: NEGATIVE

## 2016-01-25 ENCOUNTER — Ambulatory Visit (INDEPENDENT_AMBULATORY_CARE_PROVIDER_SITE_OTHER): Payer: Medicaid Other | Admitting: Advanced Practice Midwife

## 2016-01-25 ENCOUNTER — Encounter: Payer: Self-pay | Admitting: Advanced Practice Midwife

## 2016-01-25 DIAGNOSIS — O26899 Other specified pregnancy related conditions, unspecified trimester: Secondary | ICD-10-CM | POA: Insufficient documentation

## 2016-01-25 DIAGNOSIS — O9989 Other specified diseases and conditions complicating pregnancy, childbirth and the puerperium: Secondary | ICD-10-CM

## 2016-01-25 DIAGNOSIS — O34219 Maternal care for unspecified type scar from previous cesarean delivery: Secondary | ICD-10-CM | POA: Diagnosis not present

## 2016-01-25 DIAGNOSIS — R109 Unspecified abdominal pain: Secondary | ICD-10-CM | POA: Diagnosis not present

## 2016-01-25 DIAGNOSIS — O3680X0 Pregnancy with inconclusive fetal viability, not applicable or unspecified: Secondary | ICD-10-CM

## 2016-01-25 LAB — HCG, QUANTITATIVE, PREGNANCY: hCG, Beta Chain, Quant, S: 4502 m[IU]/mL — ABNORMAL HIGH (ref <5)

## 2016-01-25 MED ORDER — PROMETHAZINE HCL 25 MG PO TABS: 25.0000 mg | ORAL_TABLET | Freq: Four times a day (QID) | ORAL | Status: DC | PRN

## 2016-01-25 NOTE — Progress Notes (Signed)
Here for stat bhcg . Denies severe pain- states still having a little back pain/cramping, no bleeding. Will wait in lobby for results.

## 2016-01-25 NOTE — Addendum Note (Signed)
Addended by: Aviva SignsWILLIAMS, Quanesha Klimaszewski L on: 01/25/2016 05:52 PM   Modules accepted: Level of Service

## 2016-01-25 NOTE — Progress Notes (Addendum)
Ms. Lurene ShadowCourtney J Vantassel  is a 30 y.o. G3P1011 at 2629w1d who presents to the [facility] today for follow-up quant hCG after 48 hours. The patient was seen in MAU on 01/23/16 and had quant hCG of 1965 and US showed GS only. She endorses crampy pain, but no vaginal bleeding or fever today.   OB History  Gravida Para Term Preterm AB SAB TAB Ectopic Multiple Living  3 1 1  1  0 1 0 0 1    # Outcome Date GA Lbr Len/2nd Weight Sex Delivery Anes PTL Lv  3 Current           2 Term 10/19/10 2068w0d   M CS-LTranv EPI  Y     Comments: "stuck in the birth canal"  1 TAB               Past Medical History  Diagnosis Date  . Anemia   . Anxiety     worse when on meds  . Vaginal Pap smear, abnormal     did bx, ok since     LMP 12/27/2015  CONSTITUTIONAL: Well-developed, well-nourished female in no acute distress.  ENT: External right and left ear normal.  EYES: EOM intact, conjunctivae normal.  MUSCULOSKELETAL: Normal range of motion.  CARDIOVASCULAR: Regular heart rate RESPIRATORY: Normal effort NEUROLOGICAL: Alert and oriented to person, place, and time.  SKIN: Skin is warm and dry. No rash noted. Not diaphoretic. No erythema. No pallor. PSYCH: Normal mood and affect. Normal behavior. Normal judgment and thought content.  Results for orders placed or performed in visit on 01/25/16 (from the past 24 hour(s))  B-HCG Quant     Status: Abnormal   Collection Time: 01/25/16 11:31 AM  Result Value Ref Range   hCG, Beta Chain, Quant, S 4502 (H) <5 mIU/mL    A: Appropriate rise in quant hCG after 48 hours  P: Discharge home First trimester/ectopic precautions discussed Patient will return for follow-up US next week. Order placed and RN to schedule. [facility]  Patient may return to MAU as needed or if her condition were to change or worsen   Wants some Phenergan for nausea. Rx sent  Very concerned about she and FOB having sickle cell trait.   Briefly discussed.  Recommend they do Genetic Counseling once they establish IUP.  [facility].  15 minutes face to face consultation  Aviva SignsMarie L Jai Bear, CNM 01/25/2016 5:45 PM

## 2016-01-25 NOTE — Patient Instructions (Signed)

## 2016-02-08 ENCOUNTER — Ambulatory Visit (HOSPITAL_COMMUNITY): Payer: Medicaid Other

## 2016-02-14 ENCOUNTER — Ambulatory Visit (HOSPITAL_COMMUNITY): Admission: RE | Admit: 2016-02-14 | Payer: Medicaid Other | Source: Ambulatory Visit

## 2016-03-18 ENCOUNTER — Encounter (HOSPITAL_COMMUNITY): Payer: Self-pay | Admitting: *Deleted

## 2016-03-18 ENCOUNTER — Inpatient Hospital Stay (HOSPITAL_COMMUNITY)
Admission: AD | Admit: 2016-03-18 | Discharge: 2016-03-18 | Disposition: A | Discharge status: Home / Self Care | Payer: Medicaid Other | Source: Ambulatory Visit | Attending: Obstetrics and Gynecology | Admitting: Obstetrics and Gynecology

## 2016-03-18 DIAGNOSIS — N760 Acute vaginitis: Secondary | ICD-10-CM | POA: Diagnosis not present

## 2016-03-18 DIAGNOSIS — B9689 Other specified bacterial agents as the cause of diseases classified elsewhere: Secondary | ICD-10-CM

## 2016-03-18 DIAGNOSIS — O23591 Infection of other part of genital tract in pregnancy, first trimester: Secondary | ICD-10-CM | POA: Diagnosis not present

## 2016-03-18 DIAGNOSIS — Z888 Allergy status to other drugs, medicaments and biological substances status: Secondary | ICD-10-CM | POA: Insufficient documentation

## 2016-03-18 DIAGNOSIS — L292 Pruritus vulvae: Secondary | ICD-10-CM

## 2016-03-18 DIAGNOSIS — R109 Unspecified abdominal pain: Secondary | ICD-10-CM | POA: Diagnosis present

## 2016-03-18 LAB — URINALYSIS, ROUTINE W REFLEX MICROSCOPIC
BILIRUBIN URINE: NEGATIVE
GLUCOSE, UA: NEGATIVE mg/dL
KETONES UR: NEGATIVE mg/dL
Leukocytes, UA: NEGATIVE
Nitrite: NEGATIVE
PROTEIN: NEGATIVE mg/dL
Specific Gravity, Urine: 1.01 (ref 1.005–1.030)
pH: 5.5 (ref 5.0–8.0)

## 2016-03-18 LAB — WET PREP, GENITAL
Sperm: NONE SEEN
Trich, Wet Prep: NONE SEEN
Yeast Wet Prep HPF POC: NONE SEEN

## 2016-03-18 LAB — URINE MICROSCOPIC-ADD ON: WBC UA: NONE SEEN WBC/hpf (ref 0–5)

## 2016-03-18 MED ORDER — NYSTATIN-TRIAMCINOLONE 100000-0.1 UNIT/GM-% EX CREA
TOPICAL_CREAM | CUTANEOUS | Status: DC
Start: 2016-03-18 — End: 2016-05-16

## 2016-03-18 MED ORDER — METRONIDAZOLE 0.75 % VA GEL: 1.0000 | Freq: Every day | VAGINAL | Status: AC

## 2016-03-18 NOTE — Discharge Instructions (Signed)

## 2016-03-18 NOTE — MAU Note (Signed)
Patient had an abortion last month. Had sex last week and noticed abdominal pain, itching and spotting afterwards.

## 2016-03-18 NOTE — MAU Provider Note (Signed)
History     CSN: [id number]  Arrival date and time: 03/18/16 1910   First Provider Initiated Contact with Patient 03/18/16 1945      Chief Complaint  Patient presents with  . Abdominal Pain  . Vaginal Itching  . Vaginal Bleeding   HPI  Kellie Davila is a 30 y.o. G3P1011. She had E ab 4/18 at 7 wks. First IC was last wk with condom. She had Hajjar bleeding during IC, has resolved after. She now has clear to white thin discharge with external itching, no odor. She has urinary frequency, no urgencyo r dysuria, no GI changes. Same partner x 1 yr, has had neg STD screen with him.   OB History    Gravida Para Term Preterm AB TAB SAB Ectopic Multiple Living   3 1 1  1 1  0 0 0 1      Past Medical History  Diagnosis Date  . Anemia   . Anxiety     worse when on meds  . Vaginal Pap smear, abnormal     did bx, ok since    Past Surgical History  Procedure Laterality Date  . Cesarean section      Family History  Problem Relation Age of Onset  . Alcohol abuse Neg Hx   . Arthritis Neg Hx   . Asthma Neg Hx   . Birth defects Neg Hx   . Cancer Neg Hx   . COPD Neg Hx   . Depression Neg Hx   . Diabetes Neg Hx   . Drug abuse Neg Hx   . Early death Neg Hx   . Hearing loss Neg Hx   . Heart disease Neg Hx   . Hyperlipidemia Neg Hx   . Hypertension Neg Hx   . Kidney disease Neg Hx   . Learning disabilities Neg Hx   . Mental illness Neg Hx   . Mental retardation Neg Hx   . Miscarriages / Stillbirths Neg Hx   . Stroke Neg Hx   . Vision loss Neg Hx     Social History  Substance Use Topics  . Smoking status: Never Smoker   . Smokeless tobacco: Never Used  . Alcohol Use: No    Allergies:  Allergies  Allergen Reactions  . Compazine [Prochlorperazine Edisylate] Anxiety  . Reglan [Metoclopramide] Anxiety    Prescriptions prior to admission  Medication Sig Dispense Refill Last Dose  . Biotin 5000 MCG CAPS Take 1 capsule by mouth daily.   01/22/2016 at Unknown time  .  cetirizine (ZYRTEC) 10 MG tablet Take 10 mg by mouth daily as needed for allergies.   Past Week at Unknown time  . Cyanocobalamin (VITAMIN B-12) 5000 MCG TBDP Take 1 tablet by mouth daily.   01/22/2016 at Unknown time  . fluticasone (FLONASE) 50 MCG/ACT nasal spray Place 1 spray into both nostrils daily as needed for allergies or rhinitis.   Past Week at Unknown time  . promethazine (PHENERGAN) 25 MG tablet Take 1 tablet (25 mg total) by mouth every 6 (six) hours as needed for nausea or vomiting. 30 tablet 2     Review of Systems  Constitutional: Negative for fever and chills.  Gastrointestinal: Negative for nausea, vomiting, diarrhea and constipation.  Genitourinary: Positive for frequency. Negative for dysuria and urgency.   Physical Exam   Blood pressure 112/69, pulse 74, temperature 97.6 F (36.4 C), temperature source Oral, resp. rate 16, height 5\' 2"  (1.575 m), weight 163 lb (73.936 kg), last  menstrual period 12/27/2015, unknown if currently breastfeeding.  Physical Exam  Nursing note and vitals reviewed. Constitutional: She is oriented to person, place, and time. She appears well-developed and well-nourished.  GI: Soft. There is no tenderness.  Genitourinary:  Pelvic exam: Ext gen- nl anatomy, skin intact Vagina-mod amt creamy white discharge Cx- closed, no eversion Uterus- nl size, non tender Adn- non tender, no masses palp  Musculoskeletal: Normal range of motion.  Neurological: She is alert and oriented to person, place, and time.  Skin: Skin is warm and dry.  Psychiatric: She has a normal mood and affect. Her behavior is normal.    MAU Course  Procedures  MDM Results for orders placed or performed during the hospital encounter of 03/18/16 (from the past 24 hour(s))  Urinalysis, Routine w reflex microscopic (not at [facility])     Status: Abnormal   Collection Time: 03/18/16  7:24 PM  Result Value Ref Range   Color, Urine YELLOW YELLOW   APPearance CLEAR CLEAR   Specific  Gravity, Urine 1.010 1.005 - 1.030   pH 5.5 5.0 - 8.0   Glucose, UA NEGATIVE NEGATIVE mg/dL   Hgb urine dipstick TRACE (A) NEGATIVE   Bilirubin Urine NEGATIVE NEGATIVE   Ketones, ur NEGATIVE NEGATIVE mg/dL   Protein, ur NEGATIVE NEGATIVE mg/dL   Nitrite NEGATIVE NEGATIVE   Leukocytes, UA NEGATIVE NEGATIVE  Urine microscopic-add on     Status: Abnormal   Collection Time: 03/18/16  7:24 PM  Result Value Ref Range   Squamous Epithelial / LPF 0-5 (A) NONE SEEN   WBC, UA NONE SEEN 0 - 5 WBC/hpf   RBC / HPF 0-5 0 - 5 RBC/hpf   Bacteria, UA RARE (A) NONE SEEN  Wet prep, genital     Status: Abnormal   Collection Time: 03/18/16  7:57 PM  Result Value Ref Range   Yeast Wet Prep HPF POC NONE SEEN NONE SEEN   Trich, Wet Prep NONE SEEN NONE SEEN   Clue Cells Wet Prep HPF POC PRESENT (A) NONE SEEN   WBC, Wet Prep HPF POC FEW (A) NONE SEEN   Sperm NONE SEEN      Assessment and Plan  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 03/18/2016, 7:54 PM

## 2016-03-19 LAB — GC/CHLAMYDIA PROBE AMP (~~LOC~~) NOT AT ARMC
Chlamydia: NEGATIVE
Neisseria Gonorrhea: NEGATIVE

## 2016-03-19 MED ORDER — PROPOFOL 10 MG/ML IV BOLUS
INTRAVENOUS | Status: AC
  Filled 2016-03-19: qty 40

## 2016-03-19 MED ORDER — DEXAMETHASONE SODIUM PHOSPHATE 10 MG/ML IJ SOLN
INTRAMUSCULAR | Status: AC
  Filled 2016-03-19: qty 1

## 2016-03-19 MED ORDER — FENTANYL CITRATE (PF) 250 MCG/5ML IJ SOLN
INTRAMUSCULAR | Status: AC
  Filled 2016-03-19: qty 5

## 2016-03-19 MED ORDER — ONDANSETRON HCL 4 MG/2ML IJ SOLN
INTRAMUSCULAR | Status: AC
  Filled 2016-03-19: qty 2

## 2016-03-19 MED ORDER — MIDAZOLAM HCL 2 MG/2ML IJ SOLN
INTRAMUSCULAR | Status: AC
  Filled 2016-03-19: qty 2

## 2016-04-25 ENCOUNTER — Telehealth: Payer: Self-pay

## 2016-04-25 DIAGNOSIS — G930 Cerebral cysts: Secondary | ICD-10-CM

## 2016-04-25 NOTE — Telephone Encounter (Signed)
Pt was unable to have MRI done in feb. Wants to reschedule. Updated order, gave pt [facility]'s info.  [phone]([address] [place], [place] [address] ph: [phone] fax: [phone])

## 2016-04-27 ENCOUNTER — Emergency Department (HOSPITAL_COMMUNITY)
Admission: EM | Admit: 2016-04-27 | Discharge: 2016-04-27 | Disposition: A | Discharge status: Home / Self Care | Payer: Medicaid Other | Attending: Emergency Medicine | Admitting: Emergency Medicine

## 2016-04-27 ENCOUNTER — Encounter (HOSPITAL_COMMUNITY): Payer: Self-pay | Admitting: Emergency Medicine

## 2016-04-27 DIAGNOSIS — Z791 Long term (current) use of non-steroidal anti-inflammatories (NSAID): Secondary | ICD-10-CM | POA: Diagnosis not present

## 2016-04-27 DIAGNOSIS — Z3490 Encounter for supervision of normal pregnancy, unspecified, unspecified trimester: Secondary | ICD-10-CM

## 2016-04-27 DIAGNOSIS — Z3A08 8 weeks gestation of pregnancy: Secondary | ICD-10-CM | POA: Insufficient documentation

## 2016-04-27 DIAGNOSIS — O219 Vomiting of pregnancy, unspecified: Secondary | ICD-10-CM | POA: Diagnosis not present

## 2016-04-27 DIAGNOSIS — R1111 Vomiting without nausea: Secondary | ICD-10-CM

## 2016-04-27 LAB — COMPREHENSIVE METABOLIC PANEL
ALK PHOS: 38 U/L (ref 38–126)
ALT: 17 U/L (ref 14–54)
ANION GAP: 6 (ref 5–15)
AST: 20 U/L (ref 15–41)
Albumin: 4.4 g/dL (ref 3.5–5.0)
BILIRUBIN TOTAL: 0.7 mg/dL (ref 0.3–1.2)
BUN: 13 mg/dL (ref 6–20)
CALCIUM: 9.2 mg/dL (ref 8.9–10.3)
CO2: 26 mmol/L (ref 22–32)
Chloride: 104 mmol/L (ref 101–111)
Creatinine, Ser: 1.01 mg/dL — ABNORMAL HIGH (ref 0.44–1.00)
GLUCOSE: 98 mg/dL (ref 65–99)
POTASSIUM: 3.5 mmol/L (ref 3.5–5.1)
Sodium: 136 mmol/L (ref 135–145)
TOTAL PROTEIN: 7.7 g/dL (ref 6.5–8.1)

## 2016-04-27 LAB — URINALYSIS, ROUTINE W REFLEX MICROSCOPIC
BILIRUBIN URINE: NEGATIVE
Glucose, UA: NEGATIVE mg/dL
Ketones, ur: NEGATIVE mg/dL
LEUKOCYTES UA: NEGATIVE
NITRITE: NEGATIVE
Protein, ur: NEGATIVE mg/dL
SPECIFIC GRAVITY, URINE: 1.019 (ref 1.005–1.030)
pH: 5.5 (ref 5.0–8.0)

## 2016-04-27 LAB — CBC
HCT: 32.3 % — ABNORMAL LOW (ref 36.0–46.0)
HEMOGLOBIN: 11.6 g/dL — AB (ref 12.0–15.0)
MCH: 29.1 pg (ref 26.0–34.0)
MCHC: 35.9 g/dL (ref 30.0–36.0)
MCV: 81 fL (ref 78.0–100.0)
Platelets: 349 10*3/uL (ref 150–400)
RBC: 3.99 MIL/uL (ref 3.87–5.11)
RDW: 13.5 % (ref 11.5–15.5)
WBC: 7.1 10*3/uL (ref 4.0–10.5)

## 2016-04-27 LAB — URINE MICROSCOPIC-ADD ON

## 2016-04-27 LAB — I-STAT BETA HCG BLOOD, ED (MC, WL, AP ONLY): I-stat hCG, quantitative: 5.2 m[IU]/mL — ABNORMAL HIGH (ref <5)

## 2016-04-27 LAB — LIPASE, BLOOD: Lipase: 23 U/L (ref 11–51)

## 2016-04-27 LAB — HCG, QUANTITATIVE, PREGNANCY: hCG, Beta Chain, Quant, S: 6 m[IU]/mL — ABNORMAL HIGH (ref <5)

## 2016-04-27 MED ORDER — PROMETHAZINE HCL 12.5 MG PO TABS: 12.5000 mg | ORAL_TABLET | Freq: Three times a day (TID) | ORAL | Status: DC | PRN

## 2016-04-27 MED ORDER — FAMOTIDINE 20 MG PO TABS
20.0000 mg | ORAL_TABLET | Freq: Once | ORAL | Status: AC
  Administered 2016-04-27: 20 mg via ORAL
  Filled 2016-04-27: qty 1

## 2016-04-27 MED ORDER — ONDANSETRON 8 MG PO TBDP
8.0000 mg | ORAL_TABLET | Freq: Once | ORAL | Status: AC
  Administered 2016-04-27: 8 mg via ORAL
  Filled 2016-04-27: qty 1

## 2016-04-27 NOTE — ED Provider Notes (Signed)
CSN: [id number]     Arrival date & time 04/27/16  0026 History  By signing my name below, I, Rosario AdieWilliam Andrew Hiatt, attest that this documentation has been prepared under the direction and in the presence of Magdelyn Roebuck, MD.  Electronically Signed: Rosario AdieWilliam Andrew Hiatt, ED Scribe. 04/27/2016. 3:47 AM.    Chief Complaint  Patient presents with  . Nausea  . Abdominal Pain   Patient is a 30 y.o. female presenting with vomiting. The history is provided by the patient. No language interpreter was used.  Emesis Severity:  Mild Timing:  Rare Quality:  Stomach contents Progression:  Unchanged Chronicity:  New Recent urination:  Normal Context: not post-tussive   Relieved by:  Nothing Worsened by:  Nothing tried Ineffective treatments:  None tried Associated symptoms: abdominal pain (RLQ)   Associated symptoms: no diarrhea   Risk factors: no alcohol use    HPI Comments: Kellie Davila is a 30 y.o. female who presents to the Emergency Department complaining of gradual onset, gradually worsening, constant R flank pain that started post emesis.. Pt reports that she has been nauseous for a few days PTA, and she began vomiting x 2 episodes that began earlier today PTA. She has also had frequency since the onset of her pain. Denies any recent heavy lifting. She has not eaten anything out of the ordinary or the tasted differently. She denies any sick contact. Denies cough, dysuria, constipation, or diarrhea. LNMP: 04/07/16.  Past Medical History  Diagnosis Date  . Anemia   . Anxiety     worse when on meds  . Vaginal Pap smear, abnormal     did bx, ok since   Past Surgical History  Procedure Laterality Date  . Cesarean section     Family History  Problem Relation Age of Onset  . Alcohol abuse Neg Hx   . Arthritis Neg Hx   . Asthma Neg Hx   . Birth defects Neg Hx   . Cancer Neg Hx   . COPD Neg Hx   . Depression Neg Hx   . Diabetes Neg Hx   . Drug abuse Neg Hx   . Early death Neg  Hx   . Hearing loss Neg Hx   . Heart disease Neg Hx   . Hyperlipidemia Neg Hx   . Hypertension Neg Hx   . Kidney disease Neg Hx   . Learning disabilities Neg Hx   . Mental illness Neg Hx   . Mental retardation Neg Hx   . Miscarriages / Stillbirths Neg Hx   . Stroke Neg Hx   . Vision loss Neg Hx    Social History  Substance Use Topics  . Smoking status: Never Smoker   . Smokeless tobacco: Never Used  . Alcohol Use: No   OB History    Gravida Para Term Preterm AB TAB SAB Ectopic Multiple Living   3 1 1  1 1  0 0 0 1     Review of Systems  Respiratory: Negative for cough.   Gastrointestinal: Positive for nausea, vomiting and abdominal pain (RLQ). Negative for diarrhea and constipation.  Genitourinary: Positive for frequency. Negative for dysuria.  All other systems reviewed and are negative.     Allergies  Compazine and Reglan  Home Medications   Prior to Admission medications   Medication Sig Start Date End Date Taking? Authorizing Provider  ibuprofen (ADVIL,MOTRIN) 200 MG tablet Take 400 mg by mouth every 6 (six) hours as needed for moderate pain.  Yes Historical Provider, MD  nystatin-triamcinolone (MYCOLOG II) cream Apply to affected area 2x daily as needed Patient not taking: Reported on 04/27/2016 03/18/16   Rodell PernaKathy M Harris, NP   BP 101/70 mmHg  Pulse 58  Temp(Src) 98.1 F (36.7 C) (Oral)  Resp 18  Ht 5\' 2"  (1.575 m)  Wt 160 lb (72.576 kg)  BMI 29.26 kg/m2  SpO2 100%  LMP 04/20/2016   Physical Exam  Constitutional: She is oriented to person, place, and time. She appears well-developed and well-nourished. No distress.  HENT:  Head: Normocephalic and atraumatic.  Mouth/Throat: Oropharynx is clear and moist. No oropharyngeal exudate.  Eyes: Conjunctivae and EOM are normal. Pupils are equal, round, and reactive to light.  Neck: Trachea normal and normal range of motion. Neck supple. No JVD present. Carotid bruit is not present. No tracheal deviation present.   Cardiovascular: Normal rate, regular rhythm and normal heart sounds.  Exam reveals no gallop and no friction rub.   No murmur heard. Pulmonary/Chest: Effort normal and breath sounds normal. No stridor. She has no wheezes. She has no rales.  Abdominal: Soft. Bowel sounds are normal. There is no tenderness. There is no rigidity, no rebound, no guarding, no tenderness at McBurney's point and negative Murphy's sign.  No Murphy's sign or tenderness over McBurney's point.  Musculoskeletal: Normal range of motion. She exhibits no edema.  All compartments are soft, no edema noted.  Lymphadenopathy:    She has no cervical adenopathy.  Neurological: She is alert and oriented to person, place, and time. She has normal reflexes. No cranial nerve deficit. She exhibits normal muscle tone. Coordination normal.  Skin: Skin is warm and dry. She is not diaphoretic.  Psychiatric: She has a normal mood and affect. Her behavior is normal.  Nursing note and vitals reviewed.  ED Course  Procedures (including critical care time)  DIAGNOSTIC STUDIES: Oxygen Saturation is 100% on RA, normal by my interpretation.   COORDINATION OF CARE: 3:44 AM-Discussed next steps with pt. Pt verbalized understanding and is agreeable with the plan.   Labs Review Labs Reviewed  COMPREHENSIVE METABOLIC PANEL - Abnormal; Notable for the following:    Creatinine, Ser 1.01 (*)    All other components within normal limits  CBC - Abnormal; Notable for the following:    Hemoglobin 11.6 (*)    HCT 32.3 (*)    All other components within normal limits  URINALYSIS, ROUTINE W REFLEX MICROSCOPIC (NOT AT [facility]) - Abnormal; Notable for the following:    APPearance CLOUDY (*)    Hgb urine dipstick TRACE (*)    All other components within normal limits  URINE MICROSCOPIC-ADD ON - Abnormal; Notable for the following:    Squamous Epithelial / LPF 6-30 (*)    Bacteria, UA FEW (*)    All other components within normal limits  LIPASE,  BLOOD    Imaging Review No results found. I have personally reviewed and evaluated these images and lab results as part of my medical decision-making.  MDM   Final diagnoses:  None   Pt does not want IV or fluid meds, and requested to be only given pill medications.  Results for orders placed or performed during the hospital encounter of 04/27/16  Lipase, blood  Result Value Ref Range   Lipase 23 11 - 51 U/L  Comprehensive metabolic panel  Result Value Ref Range   Sodium 136 135 - 145 mmol/L   Potassium 3.5 3.5 - 5.1 mmol/L   Chloride 104 101 -  111 mmol/L   CO2 26 22 - 32 mmol/L   Glucose, Bld 98 65 - 99 mg/dL   BUN 13 6 - 20 mg/dL   Creatinine, Ser 1.61 (H) 0.44 - 1.00 mg/dL   Calcium 9.2 8.9 - 09.6 mg/dL   Total Protein 7.7 6.5 - 8.1 g/dL   Albumin 4.4 3.5 - 5.0 g/dL   AST 20 15 - 41 U/L   ALT 17 14 - 54 U/L   Alkaline Phosphatase 38 38 - 126 U/L   Total Bilirubin 0.7 0.3 - 1.2 mg/dL   GFR calc non Af Amer >60 >60 mL/min   GFR calc Af Amer >60 >60 mL/min   Anion gap 6 5 - 15  CBC  Result Value Ref Range   WBC 7.1 4.0 - 10.5 K/uL   RBC 3.99 3.87 - 5.11 MIL/uL   Hemoglobin 11.6 (L) 12.0 - 15.0 g/dL   HCT 04.5 (L) 40.9 - 81.1 %   MCV 81.0 78.0 - 100.0 fL   MCH 29.1 26.0 - 34.0 pg   MCHC 35.9 30.0 - 36.0 g/dL   RDW 91.4 78.2 - 95.6 %   Platelets 349 150 - 400 K/uL  Urinalysis, Routine w reflex microscopic  Result Value Ref Range   Color, Urine YELLOW YELLOW   APPearance CLOUDY (A) CLEAR   Specific Gravity, Urine 1.019 1.005 - 1.030   pH 5.5 5.0 - 8.0   Glucose, UA NEGATIVE NEGATIVE mg/dL   Hgb urine dipstick TRACE (A) NEGATIVE   Bilirubin Urine NEGATIVE NEGATIVE   Ketones, ur NEGATIVE NEGATIVE mg/dL   Protein, ur NEGATIVE NEGATIVE mg/dL   Nitrite NEGATIVE NEGATIVE   Leukocytes, UA NEGATIVE NEGATIVE  Urine microscopic-add on  Result Value Ref Range   Squamous Epithelial / LPF 6-30 (A) NONE SEEN   WBC, UA 0-5 0 - 5 WBC/hpf   RBC / HPF 0-5 0 - 5 RBC/hpf    Bacteria, UA FEW (A) NONE SEEN  hCG, quantitative, pregnancy  Result Value Ref Range   hCG, Beta Chain, Quant, S 6 (H) <5 mIU/mL  I-Stat Beta hCG blood, ED ([facility], [facility], [facility] only)  Result Value Ref Range   I-stat hCG, quantitative 5.2 (H) <5 mIU/mL   Comment 3           No results found.  Exam, labs and vitals are benign and reassu Patient is upset stating there is no way she could be pregnant as she had an elective AB 10 weeks ago.  When asked if she was sexually active she stated yes but that she could not be pregnant.    Patient's story for EDP and scribe is very different that triage note. Case d/w Herbert Seta NP in the MAU, bet would not be elevated 10 weeks following an elective AB and will need to follow up Monday at the [facility] clinic for repeat HCG, ok to prescribe phenergan for nausea.  Strict return precautions given. Patient verbalizes understanding and agrees to follow up  I personally performed the services described in this documentation, which was scribed in my presence. The recorded information has been reviewed and is accurate.       Cy Blamer, MD 04/27/16 [phone]

## 2016-04-27 NOTE — ED Notes (Signed)
Patient c/o nausea (one week) and right sided abdominal pain x1 day. Also reports urinary frequency. Denies fever, dysuria or diarrhea.

## 2016-04-30 ENCOUNTER — Other Ambulatory Visit: Payer: Medicaid Other

## 2016-04-30 ENCOUNTER — Telehealth: Payer: Self-pay | Admitting: General Practice

## 2016-04-30 ENCOUNTER — Encounter: Payer: Self-pay | Admitting: General Practice

## 2016-04-30 ENCOUNTER — Other Ambulatory Visit: Payer: Self-pay | Admitting: Neurology

## 2016-04-30 NOTE — Telephone Encounter (Signed)
Patient no showed for bhcg follow up. Called patient, no answer- left message stating we are trying to reach you in regards to a missed appt in our office, please call our front office staff to reschedule this appt. Will send certified letter.

## 2016-05-03 ENCOUNTER — Other Ambulatory Visit: Payer: Medicaid Other

## 2016-05-03 DIAGNOSIS — O034 Incomplete spontaneous abortion without complication: Secondary | ICD-10-CM

## 2016-05-04 ENCOUNTER — Inpatient Hospital Stay: Admission: RE | Admit: 2016-05-04 | Payer: Medicaid Other | Source: Ambulatory Visit

## 2016-05-04 LAB — HCG, QUANTITATIVE, PREGNANCY: HCG, BETA CHAIN, QUANT, S: 2.5 m[IU]/mL

## 2016-05-07 ENCOUNTER — Telehealth: Payer: Self-pay

## 2016-05-07 NOTE — Telephone Encounter (Signed)
Pt has been informed of lab results and will follow up as needed for any other women related concerns.

## 2016-05-16 ENCOUNTER — Encounter (HOSPITAL_COMMUNITY): Payer: Self-pay | Admitting: Emergency Medicine

## 2016-05-16 ENCOUNTER — Ambulatory Visit (HOSPITAL_COMMUNITY)
Admission: EM | Admit: 2016-05-16 | Discharge: 2016-05-16 | Disposition: A | Discharge status: Home / Self Care | Payer: Medicaid Other | Attending: Emergency Medicine | Admitting: Emergency Medicine

## 2016-05-16 DIAGNOSIS — J0110 Acute frontal sinusitis, unspecified: Secondary | ICD-10-CM | POA: Diagnosis not present

## 2016-05-16 MED ORDER — AMOXICILLIN-POT CLAVULANATE 875-125 MG PO TABS: 1.0000 | ORAL_TABLET | Freq: Two times a day (BID) | ORAL | Status: DC

## 2016-05-16 MED ORDER — PROMETHAZINE HCL 25 MG PO TABS: 25.0000 mg | ORAL_TABLET | Freq: Three times a day (TID) | ORAL | Status: DC | PRN

## 2016-05-16 NOTE — ED Notes (Signed)
Patient complains of headache, nausea, runny nose, irritated throat.  Symptoms started 7/12

## 2016-05-16 NOTE — ED Provider Notes (Signed)
CSN: [id number]     Arrival date & time 05/16/16  1432 History   First MD Initiated Contact with Patient 05/16/16 1535     Chief Complaint  Patient presents with  . URI   (Consider location/radiation/quality/duration/timing/severity/associated sxs/prior Treatment) HPI Comments: Patient presents with nasal congestion, sinus pressure, ear pain, sore throat for about 1 week. Getting worse in the past 2 days. Has taken OTC Ibuprofen and Allegra with minimal relief. Also experiencing some nausea due to drainage- requests medication to help with nausea.   Patient is a 30 y.o. female presenting with URI. The history is provided by the patient.  URI Presenting symptoms: congestion, facial pain, fatigue, fever, rhinorrhea and sore throat   Severity:  Moderate Onset quality:  Sudden Duration:  1 week Timing:  Constant Progression:  Worsening Chronicity:  New Relieved by:  Nothing Ineffective treatments:  OTC medications and rest Associated symptoms: sinus pain and swollen glands     Past Medical History  Diagnosis Date  . Anemia   . Anxiety     worse when on meds  . Vaginal Pap smear, abnormal     did bx, ok since   Past Surgical History  Procedure Laterality Date  . Cesarean section     Family History  Problem Relation Age of Onset  . Alcohol abuse Neg Hx   . Arthritis Neg Hx   . Asthma Neg Hx   . Birth defects Neg Hx   . Cancer Neg Hx   . COPD Neg Hx   . Depression Neg Hx   . Diabetes Neg Hx   . Drug abuse Neg Hx   . Early death Neg Hx   . Hearing loss Neg Hx   . Heart disease Neg Hx   . Hyperlipidemia Neg Hx   . Hypertension Neg Hx   . Kidney disease Neg Hx   . Learning disabilities Neg Hx   . Mental illness Neg Hx   . Mental retardation Neg Hx   . Miscarriages / Stillbirths Neg Hx   . Stroke Neg Hx   . Vision loss Neg Hx    Social History  Substance Use Topics  . Smoking status: Never Smoker   . Smokeless tobacco: Never Used  . Alcohol Use: No   OB History     Gravida Para Term Preterm AB TAB SAB Ectopic Multiple Living   0 0 0 1     Review of Systems  Constitutional: Positive for fever and fatigue.  HENT: Positive for congestion, rhinorrhea and sore throat.     Allergies  Compazine and Reglan  Home Medications   Prior to Admission medications   Medication Sig Start Date End Date Taking? Authorizing Provider  amoxicillin-clavulanate (AUGMENTIN) 875-125 MG tablet Take 1 tablet by mouth every 12 (twelve) hours. For 7 days with food. 05/16/16   Sudie Grumbling, NP  ibuprofen (ADVIL,MOTRIN) 200 MG tablet Take 400 mg by mouth every 6 (six) hours as needed for moderate pain.    Historical Provider, MD  promethazine (PHENERGAN) 25 MG tablet Take 1 tablet (25 mg total) by mouth every 8 (eight) hours as needed for nausea. 05/16/16   Sudie Grumbling, NP   Meds Ordered and Administered this Visit  Medications - No data to display  BP 130/58 mmHg  Pulse 64  Temp(Src) 99.5 F (37.5 C) (Oral)  Resp 18  Ht  (1.575 m)  Wt 163 lb (73.936 kg)  BMI 29.81  kg/m2  SpO2 100%  LMP 05/09/2016 No data found.   Physical Exam  Constitutional: She is oriented to person, place, and time. She appears well-developed and well-nourished.  HENT:  Head: Normocephalic and atraumatic.  Right Ear: Hearing, external ear and ear canal normal. Tympanic membrane is bulging.  Left Ear: Hearing, external ear and ear canal normal. Tympanic membrane is bulging.  Nose: Mucosal edema and rhinorrhea present. Right sinus exhibits maxillary sinus tenderness and frontal sinus tenderness. Left sinus exhibits maxillary sinus tenderness and frontal sinus tenderness.  Mouth/Throat: Uvula is midline and mucous membranes are normal. Posterior oropharyngeal erythema (yellow post nasal drainage present) present.  Neck: Normal range of motion. Neck supple.  Cardiovascular: Normal rate, regular rhythm and normal heart sounds.   Pulmonary/Chest: Effort normal and breath  sounds normal. She has no wheezes. She has no rales.  Lymphadenopathy:    She has cervical adenopathy.       Right cervical: Superficial cervical adenopathy present.       Left cervical: Superficial cervical adenopathy present.  Neurological: She is alert and oriented to person, place, and time.  Skin: Skin is warm and dry.    ED Course  Procedures (including critical care time)  Labs Review Labs Reviewed - No data to display  Imaging Review No results found.   Visual Acuity Review  Right Eye Distance:   Left Eye Distance:   Bilateral Distance:    Right Eye Near:   Left Eye Near:    Bilateral Near:         MDM   1. Acute frontal sinusitis, recurrence not specified    Start Augmentin as directed. May use OTC Sudafed as needed for congestion. May continue OTC Allegra as needed for allergies. Take medication with food. If still nauseous, may take Phenergan every 8 hours as needed. Patient allergic to similar class of medication but can take Phenergan and has done well in the past on this medication. Has not done well with Zofran (not effective). Encouraged to increase fluid intake and rest. Follow-up in 1 week with her PCP if symptoms are not resolving.     Sudie GrumblingAnn Berry Autym Siess, NP 05/16/16 [phone]

## 2016-05-16 NOTE — Discharge Instructions (Signed)
Take Augmentin as directed. May use OTC Sudafed as needed for congestion. May take Allegra as needed for itching/drainage. Take Phenergan as needed for nausea- will cause drowsiness. Recommend follow-up with your primary care provider if symptoms do not improve within 1 week.   Sinusitis, Adult Sinusitis is redness, soreness, and inflammation of the paranasal sinuses. Paranasal sinuses are air pockets within the bones of your face. They are located beneath your eyes, in the middle of your forehead, and above your eyes. In healthy paranasal sinuses, mucus is able to drain out, and air is able to circulate through them by way of your nose. However, when your paranasal sinuses are inflamed, mucus and air can become trapped. This can allow bacteria and other germs to grow and cause infection. Sinusitis can develop quickly and last only a short time (acute) or continue over a long period (chronic). Sinusitis that lasts for more than 12 weeks is considered chronic. CAUSES Causes of sinusitis include:  Allergies.  Structural abnormalities, such as displacement of the cartilage that separates your nostrils (deviated septum), which can decrease the air flow through your nose and sinuses and affect sinus drainage.  Functional abnormalities, such as when the small hairs (cilia) that line your sinuses and help remove mucus do not work properly or are not present. SIGNS AND SYMPTOMS Symptoms of acute and chronic sinusitis are the same. The primary symptoms are pain and pressure around the affected sinuses. Other symptoms include:  Upper toothache.  Earache.  Headache.  Bad breath.  Decreased sense of smell and taste.  A cough, which worsens when you are lying flat.  Fatigue.  Fever.  Thick drainage from your nose, which often is green and may contain pus (purulent).  Swelling and warmth over the affected sinuses. DIAGNOSIS Your health care provider will perform a physical exam. During your  exam, your health care provider may perform any of the following to help determine if you have acute sinusitis or chronic sinusitis:  Look in your nose for signs of abnormal growths in your nostrils (nasal polyps).  Tap over the affected sinus to check for signs of infection.  View the inside of your sinuses using an imaging device that has a light attached (endoscope). If your health care provider suspects that you have chronic sinusitis, one or more of the following tests may be recommended:  Allergy tests.  Nasal culture. A sample of mucus is taken from your nose, sent to a lab, and screened for bacteria.  Nasal cytology. A sample of mucus is taken from your nose and examined by your health care provider to determine if your sinusitis is related to an allergy. TREATMENT Most cases of acute sinusitis are related to a viral infection and will resolve on their own within 10 days. Sometimes, medicines are prescribed to help relieve symptoms of both acute and chronic sinusitis. These may include pain medicines, decongestants, nasal steroid sprays, or saline sprays. However, for sinusitis related to a bacterial infection, your health care provider will prescribe antibiotic medicines. These are medicines that will help kill the bacteria causing the infection. Rarely, sinusitis is caused by a fungal infection. In these cases, your health care provider will prescribe antifungal medicine. For some cases of chronic sinusitis, surgery is needed. Generally, these are cases in which sinusitis recurs more than 3 times per year, despite other treatments. HOME CARE INSTRUCTIONS  Drink plenty of water. Water helps thin the mucus so your sinuses can drain more easily.  Use a  humidifier.  Inhale steam 3-4 times a day (for example, sit in the bathroom with the shower running).  Apply a warm, moist washcloth to your face 3-4 times a day, or as directed by your health care provider.  Use saline nasal sprays  to help moisten and clean your sinuses.  Take medicines only as directed by your health care provider.  If you were prescribed either an antibiotic or antifungal medicine, finish it all even if you start to feel better. SEEK IMMEDIATE MEDICAL CARE IF:  You have increasing pain or severe headaches.  You have nausea, vomiting, or drowsiness.  You have swelling around your face.  You have vision problems.  You have a stiff neck.  You have difficulty breathing.   This information is not intended to replace advice given to you by your health care provider. Make sure you discuss any questions you have with your health care provider.   Document Released: 10/15/2005 Document Revised: 11/05/2014 Document Reviewed: 10/30/2011 Elsevier Interactive Patient Education [employer].

## 2016-06-25 ENCOUNTER — Ambulatory Visit (HOSPITAL_COMMUNITY)
Admission: EM | Admit: 2016-06-25 | Discharge: 2016-06-25 | Disposition: A | Discharge status: Home / Self Care | Payer: Medicaid Other | Attending: Internal Medicine | Admitting: Internal Medicine

## 2016-06-25 ENCOUNTER — Encounter (HOSPITAL_COMMUNITY): Payer: Self-pay | Admitting: Emergency Medicine

## 2016-06-25 DIAGNOSIS — J069 Acute upper respiratory infection, unspecified: Secondary | ICD-10-CM

## 2016-06-25 MED ORDER — AMOXICILLIN 250 MG PO CAPS: 250.0000 mg | ORAL_CAPSULE | Freq: Two times a day (BID) | ORAL | 0 refills | Status: DC

## 2016-06-25 MED ORDER — PROMETHAZINE HCL 25 MG PO TABS: 25.0000 mg | ORAL_TABLET | Freq: Four times a day (QID) | ORAL | 0 refills | Status: DC | PRN

## 2016-06-25 MED ORDER — HYDROCODONE-ACETAMINOPHEN 7.5-325 MG/15ML PO SOLN: 10.0000 mL | Freq: Four times a day (QID) | ORAL | 0 refills | Status: DC | PRN

## 2016-06-25 NOTE — ED Provider Notes (Signed)
CSN: [id number]     Arrival date & time 06/25/16  1557 History   First MD Initiated Contact with Patient 06/25/16 1730     Chief Complaint  Patient presents with  . Cough   (Consider location/radiation/quality/duration/timing/severity/associated sxs/prior Treatment) HPI 30 y/o female with onset of h/a and uri sxs about 1 week ago. No relief with otc meds. Also nausea. And some sputum production. Non smoker.denies preg at this time.  Past Medical History:  Diagnosis Date  . Anemia   . Anxiety    worse when on meds  . Vaginal Pap smear, abnormal    did bx, ok since   Past Surgical History:  Procedure Laterality Date  . CESAREAN SECTION     Family History  Problem Relation Age of Onset  . Alcohol abuse Neg Hx   . Arthritis Neg Hx   . Asthma Neg Hx   . Birth defects Neg Hx   . Cancer Neg Hx   . COPD Neg Hx   . Depression Neg Hx   . Diabetes Neg Hx   . Drug abuse Neg Hx   . Early death Neg Hx   . Hearing loss Neg Hx   . Heart disease Neg Hx   . Hyperlipidemia Neg Hx   . Hypertension Neg Hx   . Kidney disease Neg Hx   . Learning disabilities Neg Hx   . Mental illness Neg Hx   . Mental retardation Neg Hx   . Miscarriages / Stillbirths Neg Hx   . Stroke Neg Hx   . Vision loss Neg Hx    Social History  Substance Use Topics  . Smoking status: Never Smoker  . Smokeless tobacco: Never Used  . Alcohol use No   OB History    Gravida Para Term Preterm AB Living   3 1 1   1 1    SAB TAB Ectopic Multiple Live Births   0 1 0 0 1     Review of Systems  Denies:  ABDOMINAL PAIN, CHEST PAIN, CONGESTION, DYSURIA, SHORTNESS OF BREATH  Allergies  Compazine [prochlorperazine edisylate] and Reglan [metoclopramide]  Home Medications   Prior to Admission medications   Medication Sig Start Date End Date Taking? Authorizing Provider  amoxicillin (AMOXIL) 250 MG capsule Take 1 capsule (250 mg total) by mouth 2 (two) times daily. 06/25/16   Tharon Aquas, PA   amoxicillin-clavulanate (AUGMENTIN) 875-125 MG tablet Take 1 tablet by mouth every 12 (twelve) hours. For 7 days with food. 05/16/16   Sudie Grumbling, NP  HYDROcodone-acetaminophen (HYCET) 7.5-325 mg/15 ml solution Take 10 mLs by mouth 4 (four) times daily as needed for moderate pain. 06/25/16   Tharon Aquas, PA  ibuprofen (ADVIL,MOTRIN) 200 MG tablet Take 400 mg by mouth every 6 (six) hours as needed for moderate pain.    Historical Provider, MD  promethazine (PHENERGAN) 25 MG tablet Take 1 tablet (25 mg total) by mouth every 8 (eight) hours as needed for nausea. 05/16/16   Sudie Grumbling, NP  promethazine (PHENERGAN) 25 MG tablet Take 1 tablet (25 mg total) by mouth every 6 (six) hours as needed for nausea or vomiting. 06/25/16   Tharon Aquas, PA   Meds Ordered and Administered this Visit  Medications - No data to display  BP 112/73 (BP Location: Right Arm)   Pulse 64   Temp 98.6 F (37 C) (Oral)   Resp 18   LMP 06/03/2016 (Exact Date)   SpO2 100%  No  data found.   Physical Exam NURSES NOTES AND VITAL SIGNS REVIEWED. CONSTITUTIONAL: Well developed, well nourished, no acute distress HEENT: normocephalic, atraumatic EYES: Conjunctiva normal NECK:normal ROM, supple, no adenopathy PULMONARY:No respiratory distress, normal effort ABDOMINAL: Soft, ND, NT BS+, No CVAT MUSCULOSKELETAL: Normal ROM of all extremities,  SKIN: warm and dry without rash PSYCHIATRIC: Mood and affect, behavior are normal  Urgent Care Course   Clinical Course    Procedures (including critical care time)  Labs Review Labs Reviewed - No data to display  Imaging Review No results found.   Visual Acuity Review  Right Eye Distance:   Left Eye Distance:   Bilateral Distance:    Right Eye Near:   Left Eye Near:    Bilateral Near:         MDM   1. Acute upper respiratory infection     Patient is reassured that there are no issues that require transfer to higher level of care at this  time or additional tests. Patient is advised to continue home symptomatic treatment. Patient is advised that if there are new or worsening symptoms to attend the emergency department, contact primary care provider, or return to UC. Instructions of care provided discharged home in stable condition.    THIS NOTE WAS GENERATED USING A VOICE RECOGNITION SOFTWARE PROGRAM. ALL REASONABLE EFFORTS  WERE MADE TO PROOFREAD THIS DOCUMENT FOR ACCURACY.  I have verbally reviewed the discharge instructions with the patient. A printed AVS was given to the patient.  All questions were answered prior to discharge.      Tharon AquasFrank C Erinne Gillentine, PA 06/26/16 1043

## 2016-06-25 NOTE — ED Triage Notes (Signed)
[facility] a [facility] cough with a headache and nausea x 1 week.

## 2016-07-03 ENCOUNTER — Encounter (HOSPITAL_COMMUNITY): Payer: Self-pay | Admitting: Emergency Medicine

## 2016-07-03 ENCOUNTER — Emergency Department (HOSPITAL_COMMUNITY)
Admission: EM | Admit: 2016-07-03 | Discharge: 2016-07-04 | Disposition: A | Discharge status: Home / Self Care | Payer: Medicaid Other | Attending: Emergency Medicine | Admitting: Emergency Medicine

## 2016-07-03 ENCOUNTER — Emergency Department (HOSPITAL_COMMUNITY): Payer: Medicaid Other

## 2016-07-03 DIAGNOSIS — Z791 Long term (current) use of non-steroidal anti-inflammatories (NSAID): Secondary | ICD-10-CM | POA: Insufficient documentation

## 2016-07-03 DIAGNOSIS — J069 Acute upper respiratory infection, unspecified: Secondary | ICD-10-CM

## 2016-07-03 DIAGNOSIS — R109 Unspecified abdominal pain: Secondary | ICD-10-CM | POA: Insufficient documentation

## 2016-07-03 DIAGNOSIS — R112 Nausea with vomiting, unspecified: Secondary | ICD-10-CM

## 2016-07-03 LAB — CBC
HEMATOCRIT: 35.3 % — AB (ref 36.0–46.0)
HEMOGLOBIN: 12.5 g/dL (ref 12.0–15.0)
MCH: 28.8 pg (ref 26.0–34.0)
MCHC: 35.4 g/dL (ref 30.0–36.0)
MCV: 81.3 fL (ref 78.0–100.0)
Platelets: 355 10*3/uL (ref 150–400)
RBC: 4.34 MIL/uL (ref 3.87–5.11)
RDW: 13.8 % (ref 11.5–15.5)
WBC: 7.3 10*3/uL (ref 4.0–10.5)

## 2016-07-03 LAB — I-STAT BETA HCG BLOOD, ED (MC, WL, AP ONLY)

## 2016-07-03 LAB — COMPREHENSIVE METABOLIC PANEL
ALBUMIN: 4.2 g/dL (ref 3.5–5.0)
ALT: 18 U/L (ref 14–54)
ANION GAP: 7 (ref 5–15)
AST: 24 U/L (ref 15–41)
Alkaline Phosphatase: 40 U/L (ref 38–126)
BILIRUBIN TOTAL: 0.7 mg/dL (ref 0.3–1.2)
BUN: 11 mg/dL (ref 6–20)
CHLORIDE: 104 mmol/L (ref 101–111)
CO2: 26 mmol/L (ref 22–32)
Calcium: 9.4 mg/dL (ref 8.9–10.3)
Creatinine, Ser: 0.95 mg/dL (ref 0.44–1.00)
GFR calc Af Amer: >60 mL/min (ref >60)
GLUCOSE: 105 mg/dL — AB (ref 65–99)
POTASSIUM: 3.5 mmol/L (ref 3.5–5.1)
Sodium: 137 mmol/L (ref 135–145)
TOTAL PROTEIN: 8.2 g/dL — AB (ref 6.5–8.1)

## 2016-07-03 LAB — LIPASE, BLOOD: LIPASE: 22 U/L (ref 11–51)

## 2016-07-03 MED ORDER — ONDANSETRON HCL 4 MG/2ML IJ SOLN
4.0000 mg | Freq: Once | INTRAMUSCULAR | Status: DC | PRN
  Filled 2016-07-03: qty 2

## 2016-07-03 MED ORDER — IBUPROFEN 200 MG PO TABS
600.0000 mg | ORAL_TABLET | Freq: Once | ORAL | Status: AC
  Administered 2016-07-04: 600 mg via ORAL
  Filled 2016-07-03: qty 3

## 2016-07-03 MED ORDER — ONDANSETRON 4 MG PO TBDP
4.0000 mg | ORAL_TABLET | Freq: Once | ORAL | Status: AC
  Administered 2016-07-04: 4 mg via ORAL
  Filled 2016-07-03: qty 1

## 2016-07-03 NOTE — ED Notes (Signed)
MD at bedside. 

## 2016-07-03 NOTE — ED Triage Notes (Signed)
Pt c/o cough, emesis, nausea, chills and body aches x 2 weeks. Pt has been on Amox since last week for same.

## 2016-07-04 MED ORDER — ONDANSETRON 4 MG PO TBDP: 4.0000 mg | ORAL_TABLET | Freq: Three times a day (TID) | ORAL | 0 refills | Status: DC | PRN

## 2016-07-04 NOTE — ED Notes (Signed)
Pt able to tolerate PO water and ginger ale without vomiting.

## 2016-07-04 NOTE — ED Provider Notes (Signed)
[facility]-EMERGENCY DEPT Provider Note   CSN: [id number] Arrival date & time: 07/03/16  2059     History   Chief Complaint Chief Complaint  Patient presents with  . Emesis    HPI Kellie Davila is a 30 y.o. female.  The history is provided by the patient.  Patient presents with cough nasal congestion nausea and vomiting for the last 2 weeks. States she has been on amoxicillin but appears to been Augmentin. States she did not get any better. States she stopped taking it because she felt bad. States she feels weak. Has had some chills. States she has some vaginal itching also. States it does not feel like her previous infection. States she has not been having diarrhea.  Past Medical History:  Diagnosis Date  . Anemia   . Anxiety    worse when on meds  . Vaginal Pap smear, abnormal    did bx, ok since    Patient Active Problem List   Diagnosis Date Noted  . Abdominal pain affecting pregnancy, antepartum 01/25/2016  . Previous cesarean delivery affecting pregnancy, antepartum 01/25/2016  . 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 Abnormal Pap smear of cervix 12/27/2014    Past Surgical History:  Procedure Laterality Date  . CESAREAN SECTION      OB History    Gravida Para Term Preterm AB Living   3 1 1   1 1    SAB TAB Ectopic Multiple Live Births   0 1 0 0 1       Home Medications    Prior to Admission medications   Medication Sig Start Date End Date Taking? Authorizing Provider  ibuprofen (ADVIL,MOTRIN) 200 MG tablet Take 400 mg by mouth every 6 (six) hours as needed for moderate pain.   Yes Historical Provider, MD  amoxicillin (AMOXIL) 250 MG capsule Take 1 capsule (250 mg total) by mouth 2 (two) times daily. Patient not taking: Reported on 07/04/2016 06/25/16   Tharon AquasFrank C Patrick, PA  amoxicillin-clavulanate (AUGMENTIN) 875-125 MG tablet Take 1 tablet by mouth every 12 (twelve) hours. For 7 days with food. Patient not taking: Reported on 07/04/2016 05/16/16   Sudie GrumblingAnn Berry Amyot, NP    HYDROcodone-acetaminophen (HYCET) 7.5-325 mg/15 ml solution Take 10 mLs by mouth 4 (four) times daily as needed for moderate pain. Patient not taking: Reported on 07/04/2016 06/25/16   Tharon AquasFrank C Patrick, PA  ondansetron (ZOFRAN-ODT) 4 MG disintegrating tablet Take 1 tablet (4 mg total) by mouth every 8 (eight) hours as needed for nausea or vomiting. 07/04/16   Benjiman CoreNathan Khyson Sebesta, MD  promethazine (PHENERGAN) 25 MG tablet Take 1 tablet (25 mg total) by mouth every 8 (eight) hours as needed for nausea. Patient not taking: Reported on 07/04/2016 05/16/16   Sudie GrumblingAnn Berry Amyot, NP  promethazine (PHENERGAN) 25 MG tablet Take 1 tablet (25 mg total) by mouth every 6 (six) hours as needed for nausea or vomiting. Patient not taking: Reported on 07/04/2016 06/25/16   Tharon AquasFrank C Patrick, PA    Family History Family History  Problem Relation Age of Onset  . Alcohol abuse Neg Hx   . Arthritis Neg Hx   . Asthma Neg Hx   . Birth defects Neg Hx   . Cancer Neg Hx   . COPD Neg Hx   . Depression Neg Hx   . Diabetes Neg Hx   . Drug abuse Neg Hx   . Early death Neg Hx   . Hearing loss Neg Hx   . Heart disease Neg  Hx   . Hyperlipidemia Neg Hx   . Hypertension Neg Hx   . Kidney disease Neg Hx   . Learning disabilities Neg Hx   . Mental illness Neg Hx   . Mental retardation Neg Hx   . Miscarriages / Stillbirths Neg Hx   . Stroke Neg Hx   . Vision loss Neg Hx     Social History Social History  Substance Use Topics  . Smoking status: Never Smoker  . Smokeless tobacco: Never Used  . Alcohol use No     Allergies   Compazine [prochlorperazine edisylate] and Reglan [metoclopramide]   Review of Systems Review of Systems  Constitutional: Positive for appetite change and fatigue.  HENT: Positive for congestion and sore throat.   Respiratory: Positive for cough.   Cardiovascular: Negative for chest pain.  Gastrointestinal: Positive for abdominal pain, nausea and vomiting. Negative for constipation and diarrhea.   Endocrine: Negative for polydipsia and polyphagia.  Genitourinary: Negative for flank pain, vaginal discharge and vaginal pain.  Musculoskeletal: Negative for joint swelling.  Skin: Negative for wound.  Neurological: Positive for light-headedness.  Hematological: Negative for adenopathy.  Psychiatric/Behavioral: Negative for confusion.     Physical Exam Updated Vital Signs BP 111/67 (BP Location: Right Arm)   Pulse 97   Temp 99.6 F (37.6 C) (Oral)   Resp 18   Ht 5\' 2"  (1.575 m)   Wt 163 lb (73.9 kg)   LMP 06/03/2016 (Exact Date)   SpO2 97%   BMI 29.81 kg/m   Physical Exam  Constitutional: She appears well-developed.  HENT:  Head: Atraumatic.  Mouth/Throat: No oropharyngeal exudate.  Mild posterior pharyngeal erythema without exudate.  Eyes: EOM are normal.  Cardiovascular: Normal rate.   Pulmonary/Chest: Effort normal.  Abdominal: Soft. There is no tenderness.  Musculoskeletal: Normal range of motion.  Neurological: She is alert.  Skin: Skin is warm.  Psychiatric: She has a normal mood and affect.     ED Treatments / Results  Labs (all labs ordered are listed, but only abnormal results are displayed) Labs Reviewed  COMPREHENSIVE METABOLIC PANEL - Abnormal; Notable for the following:       Result Value   Glucose, Bld 105 (*)    Total Protein 8.2 (*)    All other components within normal limits  CBC - Abnormal; Notable for the following:    HCT 35.3 (*)    All other components within normal limits  LIPASE, BLOOD  URINALYSIS, ROUTINE W REFLEX MICROSCOPIC (NOT AT [facility])  I-STAT BETA HCG BLOOD, ED ([facility], [facility], [facility] ONLY)    EKG  EKG Interpretation None       Radiology Dg Chest 2 View  Result Date: 07/03/2016 CLINICAL DATA:  30 year old female with cough and fever EXAM: CHEST  2 VIEW COMPARISON:  Chest radiograph dated 09/15/2015 FINDINGS: The heart size and mediastinal contours are within normal limits. Both lungs are clear. The visualized skeletal structures  are unremarkable. IMPRESSION: No active cardiopulmonary disease. Electronically Signed   By: Elgie Collard M.D.   On: 07/03/2016 22:52    Procedures Procedures (including critical care time)  Medications Ordered in ED Medications  ondansetron (ZOFRAN-ODT) disintegrating tablet 4 mg (4 mg Oral Given 07/04/16 0002)  ibuprofen (ADVIL,MOTRIN) tablet 600 mg (600 mg Oral Given 07/04/16 0001)     Initial Impression / Assessment and Plan / ED Course  I have reviewed the triage vital signs and the nursing notes.  Pertinent labs & imaging results that were available during my  care of the patient were reviewed by me and considered in my medical decision making (see chart for details).  Clinical Course    Patient with 2 weeks of URI symptoms and nausea and vomiting. Had been on antibiotics without improvement. Slight abdominal pain but benign exam. Blood pressure improved. Feels better after Zofran and has tolerated orals. Lab work reassuring and does not appear to have a severe dehydration. Maybe viral and antibiotics would not work. Will discharge home follow-up as needed.  Final Clinical Impressions(s) / ED Diagnoses   Final diagnoses:  URI (upper respiratory infection)  Non-intractable vomiting with nausea, vomiting of unspecified type    New Prescriptions New Prescriptions   ONDANSETRON (ZOFRAN-ODT) 4 MG DISINTEGRATING TABLET    Take 1 tablet (4 mg total) by mouth every 8 (eight) hours as needed for nausea or vomiting.     Benjiman Core, MD 07/04/16 [phone]

## 2016-08-30 ENCOUNTER — Encounter (HOSPITAL_COMMUNITY): Payer: Self-pay | Admitting: Emergency Medicine

## 2016-08-30 ENCOUNTER — Emergency Department (HOSPITAL_COMMUNITY)
Admission: EM | Admit: 2016-08-30 | Discharge: 2016-08-30 | Disposition: A | Discharge status: Home / Self Care | Payer: Medicaid Other | Attending: Emergency Medicine | Admitting: Emergency Medicine

## 2016-08-30 DIAGNOSIS — R110 Nausea: Secondary | ICD-10-CM

## 2016-08-30 DIAGNOSIS — R51 Headache: Secondary | ICD-10-CM | POA: Diagnosis present

## 2016-08-30 DIAGNOSIS — R519 Headache, unspecified: Secondary | ICD-10-CM

## 2016-08-30 DIAGNOSIS — R112 Nausea with vomiting, unspecified: Secondary | ICD-10-CM | POA: Insufficient documentation

## 2016-08-30 DIAGNOSIS — J00 Acute nasopharyngitis [common cold]: Secondary | ICD-10-CM | POA: Diagnosis not present

## 2016-08-30 LAB — URINALYSIS, ROUTINE W REFLEX MICROSCOPIC
BILIRUBIN URINE: NEGATIVE
Glucose, UA: NEGATIVE mg/dL
HGB URINE DIPSTICK: NEGATIVE
Ketones, ur: NEGATIVE mg/dL
Leukocytes, UA: NEGATIVE
NITRITE: NEGATIVE
PROTEIN: NEGATIVE mg/dL
Specific Gravity, Urine: 1.015 (ref 1.005–1.030)
pH: 6 (ref 5.0–8.0)

## 2016-08-30 LAB — CBC WITH DIFFERENTIAL/PLATELET
BASOS PCT: 0 %
Basophils Absolute: 0 10*3/uL (ref 0.0–0.1)
EOS ABS: 0.2 10*3/uL (ref 0.0–0.7)
EOS PCT: 4 %
HCT: 32.6 % — ABNORMAL LOW (ref 36.0–46.0)
HEMOGLOBIN: 11.3 g/dL — AB (ref 12.0–15.0)
LYMPHS ABS: 2 10*3/uL (ref 0.7–4.0)
Lymphocytes Relative: 35 %
MCH: 28.6 pg (ref 26.0–34.0)
MCHC: 34.7 g/dL (ref 30.0–36.0)
MCV: 82.5 fL (ref 78.0–100.0)
MONO ABS: 0.3 10*3/uL (ref 0.1–1.0)
MONOS PCT: 5 %
Neutro Abs: 3.1 10*3/uL (ref 1.7–7.7)
Neutrophils Relative %: 56 %
PLATELETS: 335 10*3/uL (ref 150–400)
RBC: 3.95 MIL/uL (ref 3.87–5.11)
RDW: 14.1 % (ref 11.5–15.5)
WBC: 5.6 10*3/uL (ref 4.0–10.5)

## 2016-08-30 LAB — COMPREHENSIVE METABOLIC PANEL
ALBUMIN: 4.1 g/dL (ref 3.5–5.0)
ALK PHOS: 34 U/L — AB (ref 38–126)
ALT: 29 U/L (ref 14–54)
ANION GAP: 5 (ref 5–15)
AST: 29 U/L (ref 15–41)
BUN: 12 mg/dL (ref 6–20)
CO2: 28 mmol/L (ref 22–32)
Calcium: 9.4 mg/dL (ref 8.9–10.3)
Chloride: 104 mmol/L (ref 101–111)
Creatinine, Ser: 0.88 mg/dL (ref 0.44–1.00)
GFR calc non Af Amer: >60 mL/min (ref >60)
Glucose, Bld: 93 mg/dL (ref 65–99)
POTASSIUM: 3.9 mmol/L (ref 3.5–5.1)
SODIUM: 137 mmol/L (ref 135–145)
Total Bilirubin: 0.6 mg/dL (ref 0.3–1.2)
Total Protein: 7.7 g/dL (ref 6.5–8.1)

## 2016-08-30 LAB — I-STAT BETA HCG BLOOD, ED (MC, WL, AP ONLY): I-stat hCG, quantitative: <5 m[IU]/mL (ref <5)

## 2016-08-30 MED ORDER — CETIRIZINE-PSEUDOEPHEDRINE ER 5-120 MG PO TB12: 1.0000 | ORAL_TABLET | Freq: Every day | ORAL | 0 refills | Status: DC

## 2016-08-30 MED ORDER — PROMETHAZINE HCL 25 MG PO TABS: 25.0000 mg | ORAL_TABLET | Freq: Four times a day (QID) | ORAL | 0 refills | Status: DC | PRN

## 2016-08-30 MED ORDER — IBUPROFEN 800 MG PO TABS
800.0000 mg | ORAL_TABLET | Freq: Once | ORAL | Status: AC
  Administered 2016-08-30: 800 mg via ORAL
  Filled 2016-08-30: qty 1

## 2016-08-30 NOTE — Discharge Instructions (Signed)
Your blood work looks normal today. Your hemoglobin is just slightly low at 11.4. Take phenergan for nausea and vomiting. Take zyrtec d for congestion. Ibuprofen and tylenol for headache. Miralax for constipation. Follow up with family doctor.

## 2016-08-30 NOTE — ED Notes (Signed)
Patient ambulatory to restroom for urine sample.

## 2016-08-30 NOTE — ED Provider Notes (Signed)
[facility]ENC[facility] Provider Note   CSN: [id number] Arrival date & time: 08/30/16  1236     History   Chief Complaint Chief Complaint  Patient presents with  . Fatigue  . Headache  . Nausea    HPI Kellie Davila is a 30 y.o. female.  HPI Kellie Davila is a 30 y.o. female history of anemia, anxiety, presents to emergency department complaining of generalized weakness, nasal congestion, headaches, nausea. Patient states she has had generalized weakness for proximally 2 weeks. She states nasal congestion and sneezing as well as headache started approximately 3 days ago. States she is generally weak, has no energy. denies vomiting, diarrhea, abdominal pain. No chest pain or shortness of breath. She states that she finished her menstrual cycle one week ago, states it was heavy. Denies possibility of being pregnant. No treatment prior to coming in.   Past Medical History:  Diagnosis Date  . Anemia   . Anxiety    worse when on meds  . Vaginal Pap smear, abnormal    did bx, ok since    Patient Active Problem List   Diagnosis Date Noted  . Abdominal pain affecting pregnancy, antepartum 01/25/2016  . Previous cesarean delivery affecting pregnancy, antepartum 01/25/2016  . 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 Abnormal Pap smear of cervix 12/27/2014    Past Surgical History:  Procedure Laterality Date  . CESAREAN SECTION      OB History    Gravida Para Term Preterm AB Living   3 1 1   1 1    SAB TAB Ectopic Multiple Live Births   0 1 0 0 1       Home Medications    Prior to Admission medications   Medication Sig Start Date End Date Taking? Authorizing Provider  ibuprofen (ADVIL,MOTRIN) 200 MG tablet Take 400 mg by mouth every 6 (six) hours as needed for moderate pain.   Yes Historical Provider, MD  amoxicillin (AMOXIL) 250 MG capsule Take 1 capsule (250 mg total) by mouth 2 (two) times daily. Patient not taking: Reported on 07/04/2016 06/25/16   Tharon AquasFrank C Patrick, PA    amoxicillin-clavulanate (AUGMENTIN) 875-125 MG tablet Take 1 tablet by mouth every 12 (twelve) hours. For 7 days with food. Patient not taking: Reported on 07/04/2016 05/16/16   Sudie GrumblingAnn Berry Amyot, NP  HYDROcodone-acetaminophen (HYCET) 7.5-325 mg/15 ml solution Take 10 mLs by mouth 4 (four) times daily as needed for moderate pain. Patient not taking: Reported on 07/04/2016 06/25/16   Tharon AquasFrank C Patrick, PA  ondansetron (ZOFRAN-ODT) 4 MG disintegrating tablet Take 1 tablet (4 mg total) by mouth every 8 (eight) hours as needed for nausea or vomiting. Patient not taking: Reported on 08/30/2016 07/04/16   Benjiman CoreNathan Pickering, MD  promethazine (PHENERGAN) 25 MG tablet Take 1 tablet (25 mg total) by mouth every 8 (eight) hours as needed for nausea. Patient not taking: Reported on 07/04/2016 05/16/16   Sudie GrumblingAnn Berry Amyot, NP  promethazine (PHENERGAN) 25 MG tablet Take 1 tablet (25 mg total) by mouth every 6 (six) hours as needed for nausea or vomiting. Patient not taking: Reported on 07/04/2016 06/25/16   Tharon AquasFrank C Patrick, PA    Family History Family History  Problem Relation Age of Onset  . Alcohol abuse Neg Hx   . Arthritis Neg Hx   . Asthma Neg Hx   . Birth defects Neg Hx   . Cancer Neg Hx   . COPD Neg Hx   . Depression Neg Hx   .  Diabetes Neg Hx   . Drug abuse Neg Hx   . Early death Neg Hx   . Hearing loss Neg Hx   . Heart disease Neg Hx   . Hyperlipidemia Neg Hx   . Hypertension Neg Hx   . Kidney disease Neg Hx   . Learning disabilities Neg Hx   . Mental illness Neg Hx   . Mental retardation Neg Hx   . Miscarriages / Stillbirths Neg Hx   . Stroke Neg Hx   . Vision loss Neg Hx     Social History Social History  Substance Use Topics  . Smoking status: Never Smoker  . Smokeless tobacco: Never Used  . Alcohol use No     Allergies   Compazine [prochlorperazine edisylate] and Reglan [metoclopramide]   Review of Systems Review of Systems  Constitutional: Positive for fatigue. Negative for chills  and fever.  HENT: Positive for congestion, sinus pressure and sneezing.   Respiratory: Negative for cough, chest tightness and shortness of breath.   Cardiovascular: Negative for chest pain, palpitations and leg swelling.  Gastrointestinal: Positive for nausea. Negative for abdominal pain, diarrhea and vomiting.  Genitourinary: Negative for dysuria, flank pain, pelvic pain, vaginal bleeding, vaginal discharge and vaginal pain.  Musculoskeletal: Negative for arthralgias, myalgias, neck pain and neck stiffness.  Skin: Negative for rash.  Neurological: Positive for dizziness, light-headedness and headaches. Negative for weakness.  All other systems reviewed and are negative.    Physical Exam Updated Vital Signs BP 102/72   Pulse 89   Temp 98 F (36.7 C)   Resp 16   Ht 5\' 2"  (1.575 m)   Wt 79.4 kg   LMP 08/25/2016 (Exact Date)   SpO2 100%   BMI 32.01 kg/m   Physical Exam  Constitutional: She is oriented to person, place, and time. She appears well-developed and well-nourished. No distress.  HENT:  Head: Normocephalic.  Right Ear: Tympanic membrane, external ear and ear canal normal.  Left Ear: Tympanic membrane, external ear and ear canal normal.  Nose: Mucosal edema and rhinorrhea present. Right sinus exhibits no maxillary sinus tenderness and no frontal sinus tenderness. Left sinus exhibits no maxillary sinus tenderness and no frontal sinus tenderness.  Mouth/Throat: Uvula is midline, oropharynx is clear and moist and mucous membranes are normal.  Eyes: Conjunctivae are normal.  Neck: Neck supple.  Cardiovascular: Normal rate, regular rhythm and normal heart sounds.   Pulmonary/Chest: Effort normal and breath sounds normal. No respiratory distress. She has no wheezes. She has no rales.  Abdominal: Soft. Bowel sounds are normal. She exhibits no distension. There is no tenderness. There is no rebound.  Musculoskeletal: She exhibits no edema.  Neurological: She is alert and  oriented to person, place, and time.  Skin: Skin is warm and dry.  Psychiatric: She has a normal mood and affect. Her behavior is normal.  Nursing note and vitals reviewed.    ED Treatments / Results  Labs (all labs ordered are listed, but only abnormal results are displayed) Labs Reviewed  CBC WITH DIFFERENTIAL/PLATELET  COMPREHENSIVE METABOLIC PANEL  URINALYSIS, ROUTINE W REFLEX MICROSCOPIC (NOT AT [facility])  I-STAT BETA HCG BLOOD, ED ([facility], [facility], [facility] ONLY)    EKG  EKG Interpretation None       Radiology No results found.  Procedures Procedures (including critical care time)  Medications Ordered in ED Medications - No data to display   Initial Impression / Assessment and Plan / ED Course  I have reviewed the triage vital  signs and the nursing notes.  Pertinent labs & imaging results that were available during my care of the patient were reviewed by me and considered in my medical decision making (see chart for details).  Clinical Course    Patient in emergency department with nasal congestion, sneezing, nausea, vomiting, constipation. States she is generally weak and has no energy. Will check labs, vital signs are normal, patient is in no distress. She does have some nasal congestion, however no fever, no sinus tenderness, no abdominal tenderness.  Labs unremarkable. Hemoglobin is 11.3. Patient is given ibuprofen for her headache. We'll discharge home with Zyrtec-D, Phenergan by her request, and instructed to take ibuprofen/Tylenol for headache, MiraLAX for constipation. Suspect possible seasonal allergies versus viral URI. Patient is in no distress, nontoxic-appearing, stable for discharge home.  Vitals:   08/30/16 1238 08/30/16 1239  BP: 102/72   Pulse: 89   Resp: 16   Temp: 98 F (36.7 C)   SpO2: 100%   Weight:  79.4 kg  Height:  5\' 2"  (1.575 m)     Final Clinical Impressions(s) / ED Diagnoses   Final diagnoses:  Sinus headache  Acute rhinitis, unspecified  type  Nausea    New Prescriptions New Prescriptions   CETIRIZINE-PSEUDOEPHEDRINE (ZYRTEC-D) 5-120 MG TABLET    Take 1 tablet by mouth daily.   PROMETHAZINE (PHENERGAN) 25 MG TABLET    Take 1 tablet (25 mg total) by mouth every 6 (six) hours as needed for nausea or vomiting.     Jaynie Crumbleatyana Malacki Mcphearson, PA-C 08/30/16 1537    Benjiman CoreNathan Pickering, MD 08/31/16 [phone]

## 2016-08-30 NOTE — ED Triage Notes (Signed)
Pt c/o fatigue x 2 weeks. sts there's no chance she could be pregnant. Pt sts "I just have no energy." Pt also c/o headache, nasal pressure, and nausea x 2 days. Denies SOB, Chest pain, Diarrhea, Vomiting. A&Ox4 and ambulatory.

## 2016-08-30 NOTE — ED Notes (Signed)
Patient refused IV.  Advised that would prefer to have PO medications.

## 2016-09-19 ENCOUNTER — Ambulatory Visit: Payer: Medicaid Other | Admitting: Obstetrics & Gynecology

## 2016-09-19 ENCOUNTER — Ambulatory Visit (HOSPITAL_COMMUNITY)
Admission: EM | Admit: 2016-09-19 | Discharge: 2016-09-19 | Disposition: A | Discharge status: Home / Self Care | Payer: Medicaid Other | Attending: Emergency Medicine | Admitting: Emergency Medicine

## 2016-09-19 ENCOUNTER — Encounter (HOSPITAL_COMMUNITY): Payer: Self-pay | Admitting: Emergency Medicine

## 2016-09-19 DIAGNOSIS — A084 Viral intestinal infection, unspecified: Secondary | ICD-10-CM

## 2016-09-19 MED ORDER — ONDANSETRON 4 MG PO TBDP
ORAL_TABLET | ORAL | Status: AC
  Filled 2016-09-19: qty 1

## 2016-09-19 MED ORDER — ONDANSETRON 4 MG PO TBDP
4.0000 mg | ORAL_TABLET | Freq: Once | ORAL | Status: AC
  Administered 2016-09-19: 4 mg via ORAL

## 2016-09-19 MED ORDER — PROMETHAZINE HCL 25 MG PO TABS: 25.0000 mg | ORAL_TABLET | Freq: Four times a day (QID) | ORAL | 0 refills | Status: DC | PRN

## 2016-09-19 NOTE — ED Provider Notes (Signed)
[facility]    CSN: [id number] Arrival date & time: 09/19/16  1455     History   Chief Complaint Chief Complaint  Patient presents with  . Nausea    HPI Kellie Davila is a 30 y.o. female.   HPI She is a 30 year old woman here for evaluation of nausea and vomiting. Her symptoms started suddenly last night with multiple episodes of nonbloody nonbilious vomiting. She denies any abdominal pain or diarrhea. Her son was sick with similar symptoms 2 days ago that resolved spontaneously. No fevers. She's not been able to tolerate any by mouth, including fluids. No upper respiratory symptoms. She is currently on her menstrual period.  Past Medical History:  Diagnosis Date  . Anemia   . Anxiety    worse when on meds  . Vaginal Pap smear, abnormal    did bx, ok since    Patient Active Problem List   Diagnosis Date Noted  . Abdominal pain affecting pregnancy, antepartum 01/25/2016  . Previous cesarean delivery affecting pregnancy, antepartum 01/25/2016  . 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 Abnormal Pap smear of cervix 12/27/2014    Past Surgical History:  Procedure Laterality Date  . CESAREAN SECTION      OB History    Gravida Para Term Preterm AB Living   3 1 1   1 1    SAB TAB Ectopic Multiple Live Births   0 1 0 0 1       Home Medications    Prior to Admission medications   Medication Sig Start Date End Date Taking? Authorizing Provider  cetirizine-pseudoephedrine (ZYRTEC-D) 5-120 MG tablet Take 1 tablet by mouth daily. 08/30/16   Tatyana Kirichenko, PA-C  ibuprofen (ADVIL,MOTRIN) 200 MG tablet Take 400 mg by mouth every 6 (six) hours as needed for moderate pain.    Historical Provider, MD  promethazine (PHENERGAN) 25 MG tablet Take 1 tablet (25 mg total) by mouth every 6 (six) hours as needed for nausea or vomiting. 09/19/16   Charm RingsErin J Ermagene Saidi, MD    Family History Family History  Problem Relation Age of Onset  . Alcohol abuse Neg Hx   . Arthritis Neg Hx    . Asthma Neg Hx   . Birth defects Neg Hx   . Cancer Neg Hx   . COPD Neg Hx   . Depression Neg Hx   . Diabetes Neg Hx   . Drug abuse Neg Hx   . Early death Neg Hx   . Hearing loss Neg Hx   . Heart disease Neg Hx   . Hyperlipidemia Neg Hx   . Hypertension Neg Hx   . Kidney disease Neg Hx   . Learning disabilities Neg Hx   . Mental illness Neg Hx   . Mental retardation Neg Hx   . Miscarriages / Stillbirths Neg Hx   . Stroke Neg Hx   . Vision loss Neg Hx     Social History Social History  Substance Use Topics  . Smoking status: Never Smoker  . Smokeless tobacco: Never Used  . Alcohol use No     Allergies   Compazine [prochlorperazine edisylate] and Reglan [metoclopramide]   Review of Systems Review of Systems As in history of present illness  Physical Exam Triage Vital Signs ED Triage Vitals  Enc Vitals Group     BP 09/19/16 1506 109/67     Pulse Rate 09/19/16 1506 93     Resp 09/19/16 1506 18     Temp 09/19/16  1506 98.5 F (36.9 C)     Temp Source 09/19/16 1506 Oral     SpO2 09/19/16 1506 100 %     Weight --      Height --      Head Circumference --      Peak Flow --      Pain Score 09/19/16 1510 4     Pain Loc --      Pain Edu? --      Excl. in GC? --    No data found.   Updated Vital Signs BP 109/67 (BP Location: Left Arm)   Pulse 93   Temp 98.5 F (36.9 C) (Oral)   Resp 18   LMP 09/16/2016 (Exact Date)   SpO2 100%   Visual Acuity Right Eye Distance:   Left Eye Distance:   Bilateral Distance:    Right Eye Near:   Left Eye Near:    Bilateral Near:     Physical Exam  Constitutional: She is oriented to person, place, and time. She appears well-developed and well-nourished. No distress.  Cardiovascular: Normal rate, regular rhythm and normal heart sounds.   No murmur heard. Pulmonary/Chest: Effort normal.  Abdominal: Soft. Bowel sounds are normal. She exhibits no distension and no mass. There is no tenderness. There is no rebound and no  guarding.  Neurological: She is alert and oriented to person, place, and time.     UC Treatments / Results  Labs (all labs ordered are listed, but only abnormal results are displayed) Labs Reviewed - No data to display  EKG  EKG Interpretation None       Radiology No results found.  Procedures Procedures (including critical care time)  Medications Ordered in UC Medications  ondansetron (ZOFRAN-ODT) disintegrating tablet 4 mg (4 mg Oral Given 09/19/16 1517)     Initial Impression / Assessment and Plan / UC Course  I have reviewed the triage vital signs and the nursing notes.  Pertinent labs & imaging results that were available during my care of the patient were reviewed by me and considered in my medical decision making (see chart for details).  Clinical Course     Able to tolerate water after Zofran. Likely viral etiology. Clear liquids as tolerated. Prescription for Phenergan sent to pharmacy as patient states this works better for her than Zofran. Follow-up as needed.  Final Clinical Impressions(s) / UC Diagnoses   Final diagnoses:  Stomach flu    New Prescriptions New Prescriptions   PROMETHAZINE (PHENERGAN) 25 MG TABLET    Take 1 tablet (25 mg total) by mouth every 6 (six) hours as needed for nausea or vomiting.     Charm RingsErin J Shuaib Corsino, MD 09/19/16 1539

## 2016-09-19 NOTE — Discharge Instructions (Signed)
It looks like you got the same stomach bug your son had. Hopefully this just lasts 24 hours. Use the Phenergan every 6 hours as needed for nausea or vomiting. Stick with clear liquids for the rest of today. Follow-up as needed.

## 2016-09-19 NOTE — ED Triage Notes (Signed)
[facility] a [facility] of N/V that started this am. The patient reported that her son has had a stomach virus.

## 2016-10-01 ENCOUNTER — Ambulatory Visit: Payer: Medicaid Other | Admitting: Obstetrics & Gynecology

## 2016-10-11 ENCOUNTER — Inpatient Hospital Stay (HOSPITAL_COMMUNITY)
Admission: AD | Admit: 2016-10-11 | Discharge: 2016-10-11 | Disposition: A | Discharge status: Home / Self Care | Payer: Medicaid Other | Source: Ambulatory Visit | Attending: Obstetrics and Gynecology | Admitting: Obstetrics and Gynecology

## 2016-10-11 ENCOUNTER — Encounter (HOSPITAL_COMMUNITY): Payer: Self-pay | Admitting: *Deleted

## 2016-10-11 DIAGNOSIS — L298 Other pruritus: Secondary | ICD-10-CM | POA: Diagnosis not present

## 2016-10-11 DIAGNOSIS — R3989 Other symptoms and signs involving the genitourinary system: Secondary | ICD-10-CM

## 2016-10-11 DIAGNOSIS — Z3202 Encounter for pregnancy test, result negative: Secondary | ICD-10-CM | POA: Diagnosis not present

## 2016-10-11 DIAGNOSIS — R300 Dysuria: Secondary | ICD-10-CM | POA: Diagnosis present

## 2016-10-11 DIAGNOSIS — N898 Other specified noninflammatory disorders of vagina: Secondary | ICD-10-CM | POA: Diagnosis not present

## 2016-10-11 LAB — URINALYSIS, ROUTINE W REFLEX MICROSCOPIC
BILIRUBIN URINE: NEGATIVE
Glucose, UA: NEGATIVE mg/dL
HGB URINE DIPSTICK: NEGATIVE
KETONES UR: NEGATIVE mg/dL
Leukocytes, UA: NEGATIVE
NITRITE: NEGATIVE
Protein, ur: NEGATIVE mg/dL
SPECIFIC GRAVITY, URINE: 1.012 (ref 1.005–1.030)
pH: 5 (ref 5.0–8.0)

## 2016-10-11 LAB — WET PREP, GENITAL
Clue Cells Wet Prep HPF POC: NONE SEEN
SPERM: NONE SEEN
Trich, Wet Prep: NONE SEEN
YEAST WET PREP: NONE SEEN

## 2016-10-11 LAB — POCT PREGNANCY, URINE: PREG TEST UR: NEGATIVE

## 2016-10-11 NOTE — Progress Notes (Signed)
Patient left prior to receiving AVS form/signing for dc home/last set of VS.  Provider discussed with patient that she would be called if Rx was needed when tests were completed.  Patient left to pick up child.

## 2016-10-11 NOTE — Discharge Instructions (Signed)

## 2016-10-11 NOTE — MAU Provider Note (Signed)
Chief Complaint:  Dysuria and Vaginal Itching   First Provider Initiated Contact with Patient 10/11/16 1645       HPI: Kellie Davila is a 30 y.o. [id number] who presents to maternity admissions reporting pressure and some discomfort with urination. Also has some vaginal itching.  . She reports no vaginal bleeding, urinary symptoms, h/a, dizziness, n/v, or fever/chills.    Dysuria   This is a new problem. The current episode started in the past 7 days. The problem occurs every urination. The problem has been unchanged. The quality of the pain is described as burning. The pain is mild. There has been no fever. She is sexually active. There is no history of pyelonephritis. Associated symptoms include a discharge. Pertinent negatives include no chills, frequency, nausea, urgency or vomiting. She has tried nothing for the symptoms.  Vaginal Itching  The patient's primary symptoms include genital itching and vaginal discharge. The patient's pertinent negatives include no genital lesions, genital odor, pelvic pain or vaginal bleeding. This is a new problem. The current episode started in the past 7 days. The problem occurs constantly. The problem has been unchanged. The patient is experiencing no pain. The problem affects both sides. She is not pregnant. Associated symptoms include dysuria. Pertinent negatives include no chills, frequency, nausea, urgency or vomiting. Nothing aggravates the symptoms. She has tried nothing for the symptoms.   RN Note: Pt reports pain and pressure with urination and vaginal itching x 1 week.  Past Medical History: Past Medical History:  Diagnosis Date  . Anemia   . Anxiety    worse when on meds  . Vaginal Pap smear, abnormal    did bx, ok since    Past obstetric history: OB History  Gravida Para Term Preterm AB Living  3 1 1   2 1   SAB TAB Ectopic Multiple Live Births  0 2 0 0 1    # Outcome Date GA Lbr Len/2nd Weight Sex Delivery Anes PTL Lv  3 Term  10/19/10 [id number]   M CS-LTranv EPI  LIV     Birth Comments: "stuck in the birth canal"  2 TAB           1 TAB               Past Surgical History: Past Surgical History:  Procedure Laterality Date  . CESAREAN SECTION      Family History: Family History  Problem Relation Age of Onset  . Alcohol abuse Neg Hx   . Arthritis Neg Hx   . Asthma Neg Hx   . Birth defects Neg Hx   . Cancer Neg Hx   . COPD Neg Hx   . Depression Neg Hx   . Diabetes Neg Hx   . Drug abuse Neg Hx   . Early death Neg Hx   . Hearing loss Neg Hx   . Heart disease Neg Hx   . Hyperlipidemia Neg Hx   . Hypertension Neg Hx   . Kidney disease Neg Hx   . Learning disabilities Neg Hx   . Mental illness Neg Hx   . Mental retardation Neg Hx   . Miscarriages / Stillbirths Neg Hx   . Stroke Neg Hx   . Vision loss Neg Hx     Social History: Social History  Substance Use Topics  . Smoking status: Never Smoker  . Smokeless tobacco: Never Used  . Alcohol use No    Allergies:  Allergies  Allergen Reactions  .  Compazine [Prochlorperazine Edisylate] Anxiety  . Reglan [Metoclopramide] Anxiety    Meds:  Prescriptions Prior to Admission  Medication Sig Dispense Refill Last Dose  . cetirizine-pseudoephedrine (ZYRTEC-D) 5-120 MG tablet Take 1 tablet by mouth daily. 30 tablet 0 More than a month at Unknown time  . ibuprofen (ADVIL,MOTRIN) 200 MG tablet Take 400 mg by mouth every 6 (six) hours as needed for moderate pain.   More than a month at Unknown time  . promethazine (PHENERGAN) 25 MG tablet Take 1 tablet (25 mg total) by mouth every 6 (six) hours as needed for nausea or vomiting. 30 tablet 0     I have reviewed patient's Past Medical Hx, Surgical Hx, Family Hx, Social Hx, medications and allergies.  ROS:  Review of Systems  Constitutional: Negative for chills.  Gastrointestinal: Negative for nausea and vomiting.  Genitourinary: Positive for dysuria, vaginal discharge and vaginal pain (itching, not  pain). Negative for frequency, pelvic pain, urgency and vaginal bleeding.   Other systems negative     Physical Exam  Patient Vitals for the past 24 hrs:  BP Temp Temp src Resp Height Weight  10/11/16 1609 115/67 98.4 F (36.9 C) Oral 18 5\' 2"  (1.575 m) 175 lb (79.4 kg)   Constitutional: Well-developed, well-nourished female in no acute distress.  Cardiovascular: normal rate and rhythm, no ectopy audible, S1 & S2 heard, no murmur Respiratory: normal effort, no distress. Lungs CTAB with no wheezes or crackles GI: Abd soft, non-tender.  Nondistended.  No rebound, No guarding.  Bowel Sounds audible  MS: Extremities nontender, no edema, normal ROM Neurologic: Alert and oriented x 4.   Grossly nonfocal. GU: Neg CVAT. Skin:  Warm and Dry Psych:  Affect appropriate.  PELVIC EXAM: Cervix pink, visually closed, without lesion, scant white creamy discharge, vaginal walls and external genitalia normal Bimanual exam: Cervix firm, anterior, neg CMT, uterus nontender, nonenlarged, adnexa without tenderness, enlargement, or mass    Labs: Results for orders placed or performed during the hospital encounter of 10/11/16 (from the past 24 hour(s))  Urinalysis, Routine w reflex microscopic     Status: Abnormal   Collection Time: 10/11/16  4:05 PM  Result Value Ref Range   Color, Urine YELLOW YELLOW   APPearance HAZY (A) CLEAR   Specific Gravity, Urine 1.012 1.005 - 1.030   pH 5.0 5.0 - 8.0   Glucose, UA NEGATIVE NEGATIVE mg/dL   Hgb urine dipstick NEGATIVE NEGATIVE   Bilirubin Urine NEGATIVE NEGATIVE   Ketones, ur NEGATIVE NEGATIVE mg/dL   Protein, ur NEGATIVE NEGATIVE mg/dL   Nitrite NEGATIVE NEGATIVE   Leukocytes, UA NEGATIVE NEGATIVE  Pregnancy, urine POC     Status: None   Collection Time: 10/11/16  4:27 PM  Result Value Ref Range   Preg Test, Ur NEGATIVE NEGATIVE    Ref. Range 10/11/2016 16:57  Yeast Wet Prep HPF POC Latest Ref Range: NONE SEEN  NONE SEEN  Trich, Wet Prep Latest Ref  Range: NONE SEEN  NONE SEEN  Clue Cells Wet Prep HPF POC Latest Ref Range: NONE SEEN  NONE SEEN  WBC, Wet Prep HPF POC Latest Ref Range: NONE SEEN  FEW (A)   --/--/B POS (03/27 1103)  Imaging:  No results found.  MAU Course/MDM: I have ordered labs as follows:  Wet prep, UA, GC/Chlamydia Imaging ordered: none Results reviewed.  Treatments in MAU included none.   Pt stable at time of discharge. HAd to leave right after exam, could not wait for results.  Will call  if needed  Assessment: Vagina itching Pressure in bladder No pathology identified  Plan: Discharge home Recommend good hygeine, push fluids   Encouraged to return here or to other Urgent Care/ED if she develops worsening of symptoms, increase in pain, fever, or other concerning symptoms.   Wynelle BourgeoisMarie Mayara Paulson CNM, MSN Certified Nurse-Midwife 10/11/2016 4:45 PM

## 2016-10-11 NOTE — MAU Note (Signed)
Pt reports pain and pressure with urination and vaginal itching x 1 week.

## 2016-10-12 LAB — GC/CHLAMYDIA PROBE AMP (~~LOC~~) NOT AT ARMC
CHLAMYDIA, DNA PROBE: NEGATIVE
Neisseria Gonorrhea: NEGATIVE

## 2016-10-24 ENCOUNTER — Other Ambulatory Visit (HOSPITAL_COMMUNITY)
Admission: RE | Admit: 2016-10-24 | Discharge: 2016-10-24 | Disposition: A | Discharge status: Home / Self Care | Payer: Medicaid Other | Source: Ambulatory Visit | Attending: Family Medicine | Admitting: Family Medicine

## 2016-10-24 ENCOUNTER — Ambulatory Visit: Payer: Medicaid Other | Admitting: *Deleted

## 2016-10-24 DIAGNOSIS — N898 Other specified noninflammatory disorders of vagina: Secondary | ICD-10-CM

## 2016-10-24 DIAGNOSIS — Z113 Encounter for screening for infections with a predominantly sexual mode of transmission: Secondary | ICD-10-CM | POA: Insufficient documentation

## 2016-10-24 MED ORDER — FLUCONAZOLE 150 MG PO TABS: 150.0000 mg | ORAL_TABLET | Freq: Once | ORAL | 0 refills | Status: AC

## 2016-10-24 NOTE — Progress Notes (Signed)
Here for c/o vaginal itching with white, thick discharge without odor. Would like to be treated for yeast , but would like to do a swab to check for yeast, bv, gc/chlam.

## 2016-10-24 NOTE — Addendum Note (Signed)
Addended by: Gerome ApleyZEYFANG, Devaunte Gasparini L on: 10/24/2016 02:18 PM   Modules accepted: Orders

## 2016-10-25 LAB — WET PREP, GENITAL
Clue Cells Wet Prep HPF POC: NONE SEEN
TRICH WET PREP: NONE SEEN
Yeast Wet Prep HPF POC: NONE SEEN

## 2016-10-25 LAB — GC/CHLAMYDIA PROBE AMP (~~LOC~~) NOT AT ARMC
Chlamydia: NEGATIVE
Neisseria Gonorrhea: NEGATIVE

## 2016-10-27 ENCOUNTER — Emergency Department (HOSPITAL_COMMUNITY)
Admission: EM | Admit: 2016-10-27 | Discharge: 2016-10-27 | Disposition: A | Discharge status: Home / Self Care | Payer: Medicaid Other | Attending: Dermatology | Admitting: Dermatology

## 2016-10-27 DIAGNOSIS — M549 Dorsalgia, unspecified: Secondary | ICD-10-CM | POA: Diagnosis not present

## 2016-10-27 DIAGNOSIS — Y999 Unspecified external cause status: Secondary | ICD-10-CM | POA: Insufficient documentation

## 2016-10-27 DIAGNOSIS — Y92410 Unspecified street and highway as the place of occurrence of the external cause: Secondary | ICD-10-CM | POA: Diagnosis not present

## 2016-10-27 DIAGNOSIS — Z5321 Procedure and treatment not carried out due to patient leaving prior to being seen by health care provider: Secondary | ICD-10-CM | POA: Diagnosis not present

## 2016-10-27 DIAGNOSIS — Y939 Activity, unspecified: Secondary | ICD-10-CM | POA: Diagnosis not present

## 2016-10-27 DIAGNOSIS — M542 Cervicalgia: Secondary | ICD-10-CM | POA: Insufficient documentation

## 2016-10-27 DIAGNOSIS — M25569 Pain in unspecified knee: Secondary | ICD-10-CM | POA: Diagnosis not present

## 2016-10-27 NOTE — ED Notes (Signed)
Per registration when calling patient from lobby that she left.

## 2016-10-31 ENCOUNTER — Emergency Department (HOSPITAL_COMMUNITY): Payer: No Typology Code available for payment source

## 2016-10-31 ENCOUNTER — Other Ambulatory Visit (HOSPITAL_COMMUNITY)
Admission: RE | Admit: 2016-10-31 | Discharge: 2016-10-31 | Disposition: A | Discharge status: Home / Self Care | Payer: Medicaid Other | Source: Ambulatory Visit | Attending: Obstetrics & Gynecology | Admitting: Obstetrics & Gynecology

## 2016-10-31 ENCOUNTER — Encounter: Payer: Self-pay | Admitting: Obstetrics & Gynecology

## 2016-10-31 ENCOUNTER — Ambulatory Visit (INDEPENDENT_AMBULATORY_CARE_PROVIDER_SITE_OTHER): Payer: Medicaid Other | Admitting: Obstetrics & Gynecology

## 2016-10-31 ENCOUNTER — Encounter (HOSPITAL_COMMUNITY): Payer: Self-pay | Admitting: Emergency Medicine

## 2016-10-31 ENCOUNTER — Emergency Department (HOSPITAL_COMMUNITY)
Admission: EM | Admit: 2016-10-31 | Discharge: 2016-10-31 | Disposition: A | Discharge status: Home / Self Care | Payer: No Typology Code available for payment source | Attending: Emergency Medicine | Admitting: Emergency Medicine

## 2016-10-31 VITALS — Wt 173.5 lb

## 2016-10-31 DIAGNOSIS — Y999 Unspecified external cause status: Secondary | ICD-10-CM | POA: Diagnosis not present

## 2016-10-31 DIAGNOSIS — Y939 Activity, unspecified: Secondary | ICD-10-CM | POA: Diagnosis not present

## 2016-10-31 DIAGNOSIS — S39012A Strain of muscle, fascia and tendon of lower back, initial encounter: Secondary | ICD-10-CM | POA: Insufficient documentation

## 2016-10-31 DIAGNOSIS — Z0000 Encounter for general adult medical examination without abnormal findings: Secondary | ICD-10-CM

## 2016-10-31 DIAGNOSIS — T148XXA Other injury of unspecified body region, initial encounter: Secondary | ICD-10-CM

## 2016-10-31 DIAGNOSIS — S199XXA Unspecified injury of neck, initial encounter: Secondary | ICD-10-CM | POA: Diagnosis present

## 2016-10-31 DIAGNOSIS — Z01419 Encounter for gynecological examination (general) (routine) without abnormal findings: Secondary | ICD-10-CM

## 2016-10-31 DIAGNOSIS — Y92410 Unspecified street and highway as the place of occurrence of the external cause: Secondary | ICD-10-CM | POA: Diagnosis not present

## 2016-10-31 DIAGNOSIS — Z23 Encounter for immunization: Secondary | ICD-10-CM

## 2016-10-31 DIAGNOSIS — Z113 Encounter for screening for infections with a predominantly sexual mode of transmission: Secondary | ICD-10-CM | POA: Diagnosis present

## 2016-10-31 DIAGNOSIS — S161XXA Strain of muscle, fascia and tendon at neck level, initial encounter: Secondary | ICD-10-CM | POA: Diagnosis not present

## 2016-10-31 DIAGNOSIS — Z202 Contact with and (suspected) exposure to infections with a predominantly sexual mode of transmission: Secondary | ICD-10-CM

## 2016-10-31 LAB — POC URINE PREG, ED: Preg Test, Ur: NEGATIVE

## 2016-10-31 LAB — HEPATITIS B SURFACE ANTIGEN: Hepatitis B Surface Ag: NEGATIVE

## 2016-10-31 MED ORDER — NAPROXEN 500 MG PO TABS: 500.0000 mg | ORAL_TABLET | Freq: Two times a day (BID) | ORAL | 0 refills | Status: DC

## 2016-10-31 MED ORDER — CYCLOBENZAPRINE HCL 10 MG PO TABS: 10.0000 mg | ORAL_TABLET | Freq: Two times a day (BID) | ORAL | 0 refills | Status: DC | PRN

## 2016-10-31 MED ORDER — ACETAMINOPHEN 500 MG PO TABS
1000.0000 mg | ORAL_TABLET | Freq: Once | ORAL | Status: AC
  Administered 2016-10-31: 1000 mg via ORAL
  Filled 2016-10-31: qty 2

## 2016-10-31 NOTE — Progress Notes (Signed)
Subjective:     Kellie Davila is a 31 y.o. female here for a routine exam. [id number] LMP 10/12/2016 Current complaints: none.  Pt not interested in contraception outside of condoms at this point.  Gynecologic History Patient's last menstrual period was 10/12/2016 (exact date). Contraception: condoms Last Pap: prior to 11/2014. Results were: abnormal.  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  Review of Systems Pertinent items are noted in HPI.    Objective:  Wt 173 lb 8 oz (78.7 kg)   LMP 10/12/2016 (Exact Date)   Breastfeeding? Unknown   BMI 31.73 kg/m  BP 115/67 General Appearance:    Alert, cooperative, no distress, appears stated age  Head:    Normocephalic, without obvious abnormality, atraumatic  Eyes:    conjunctiva/corneas clear, EOM's intact, both eyes  Ears:    Normal external ear canals, both ears  Nose:   Nares normal, septum midline, mucosa normal, no drainage    or sinus tenderness  Throat:   Lips, mucosa, and tongue normal; teeth and gums normal  Neck:   Supple, symmetrical, trachea midline, no adenopathy;    thyroid:  no enlargement/tenderness/nodules  Back:     Symmetric, no curvature, ROM normal, no CVA tenderness  Lungs:     Clear to auscultation bilaterally, respirations unlabored  Chest Wall:    No tenderness or deformity   Heart:    Regular rate and rhythm, S1 and S2 normal,  no murmur, rub   or gallop  Breast Exam:    No tenderness, masses, or nipple abnormality  Abdomen:     Soft, non-tender, bowel sounds active all four quadrants,    no masses, no organomegaly  Genitalia:    Normal female without lesion, discharge or tenderness     Extremities:   Extremities normal, atraumatic, no cyanosis or edema  Pulses:   2+ and symmetric all extremities  Skin:   Skin color, texture, turgor normal, no rashes or lesions      Assessment:    Healthy female exam.   H/o abnormal PAP with biopsies showing CIN1 in 2016. STI screen     Plan:    Follow up in: 1 year. or sooner prn F/u PAP.  If normal repeat in 1 year F/u STI screen (serum and cx) Pt offered a f/u visit if she decides to consider contraception Flu shot today  Garyson Stelly L. Harraway-Smith, M.D., Evern CoreFACOG

## 2016-10-31 NOTE — Discharge Instructions (Signed)
Your xrays today are normal.  Take Flexeril at night for stiffness. Take Naproxen twice daily.  Follow up with your primary care physician in 1 week.  Return to the ED for any new or worsening symptoms.
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
## 2016-10-31 NOTE — Patient Instructions (Signed)
Contraception Choices Contraception (birth control) is the use of any methods or devices to prevent pregnancy. Below are some methods to help avoid pregnancy. Hormonal methods  Contraceptive implant. This is a thin, plastic tube containing progesterone hormone. It does not contain estrogen hormone. Your health care provider inserts the tube in the inner part of the upper arm. The tube can remain in place for up to 3 years. After 3 years, the implant must be removed. The implant prevents the ovaries from releasing an egg (ovulation), thickens the cervical mucus to prevent sperm from entering the uterus, and thins the lining of the inside of the uterus.  Progesterone-only injections. These injections are given every 3 months by your health care provider to prevent pregnancy. This synthetic progesterone hormone stops the ovaries from releasing eggs. It also thickens cervical mucus and changes the uterine lining. This makes it harder for sperm to survive in the uterus.  Birth control pills. These pills contain estrogen and progesterone hormone. They work by preventing the ovaries from releasing eggs (ovulation). They also cause the cervical mucus to thicken, preventing the sperm from entering the uterus. Birth control pills are prescribed by a health care provider.Birth control pills can also be used to treat heavy periods.  Minipill. This type of birth control pill contains only the progesterone hormone. They are taken every day of each month and must be prescribed by your health care provider.  Birth control patch. The patch contains hormones similar to those in birth control pills. It must be changed once a week and is prescribed by a health care provider.  Vaginal ring. The ring contains hormones similar to those in birth control pills. It is left in the vagina for 3 weeks, removed for 1 week, and then a new one is put back in place. The patient must be comfortable inserting and removing the ring from  the vagina.A health care provider's prescription is necessary.  Emergency contraception. Emergency contraceptives prevent pregnancy after unprotected sexual intercourse. This pill can be taken right after sex or up to 5 days after unprotected sex. It is most effective the sooner you take the pills after having sexual intercourse. Most emergency contraceptive pills are available without a prescription. Check with your pharmacist. Do not use emergency contraception as your only form of birth control. Barrier methods  Female condom. This is a thin sheath (latex or rubber) that is worn over the penis during sexual intercourse. It can be used with spermicide to increase effectiveness.  Female condom. This is a soft, loose-fitting sheath that is put into the vagina before sexual intercourse.  Diaphragm. This is a soft, latex, dome-shaped barrier that must be fitted by a health care provider. It is inserted into the vagina, along with a spermicidal jelly. It is inserted before intercourse. The diaphragm should be left in the vagina for 6 to 8 hours after intercourse.  Cervical cap. This is a round, soft, latex or plastic cup that fits over the cervix and must be fitted by a health care provider. The cap can be left in place for up to 48 hours after intercourse.  Sponge. This is a soft, circular piece of polyurethane foam. The sponge has spermicide in it. It is inserted into the vagina after wetting it and before sexual intercourse.  Spermicides. These are chemicals that kill or block sperm from entering the cervix and uterus. They come in the form of creams, jellies, suppositories, foam, or tablets. They do not require a prescription. They   are inserted into the vagina with an applicator before having sexual intercourse. The process must be repeated every time you have sexual intercourse. Intrauterine contraception  Intrauterine device (IUD). This is a T-shaped device that is put in a woman's uterus during  a menstrual period to prevent pregnancy. There are 2 types: ? Copper IUD. This type of IUD is wrapped in copper wire and is placed inside the uterus. Copper makes the uterus and fallopian tubes produce a fluid that kills sperm. It can stay in place for 10 years. ? Hormone IUD. This type of IUD contains the hormone progestin (synthetic progesterone). The hormone thickens the cervical mucus and prevents sperm from entering the uterus, and it also thins the uterine lining to prevent implantation of a fertilized egg. The hormone can weaken or kill the sperm that get into the uterus. It can stay in place for 3-5 years, depending on which type of IUD is used. Permanent methods of contraception  Female tubal ligation. This is when the woman's fallopian tubes are surgically sealed, tied, or blocked to prevent the egg from traveling to the uterus.  Hysteroscopic sterilization. This involves placing a small coil or insert into each fallopian tube. Your doctor uses a technique called hysteroscopy to do the procedure. The device causes scar tissue to form. This results in permanent blockage of the fallopian tubes, so the sperm cannot fertilize the egg. It takes about 3 months after the procedure for the tubes to become blocked. You must use another form of birth control for these 3 months.  Female sterilization. This is when the female has the tubes that carry sperm tied off (vasectomy).This blocks sperm from entering the vagina during sexual intercourse. After the procedure, the man can still ejaculate fluid (semen). Natural planning methods  Natural family planning. This is not having sexual intercourse or using a barrier method (condom, diaphragm, cervical cap) on days the woman could become pregnant.  Calendar method. This is keeping track of the length of each menstrual cycle and identifying when you are fertile.  Ovulation method. This is avoiding sexual intercourse during ovulation.  Symptothermal method.  This is avoiding sexual intercourse during ovulation, using a thermometer and ovulation symptoms.  Post-ovulation method. This is timing sexual intercourse after you have ovulated. Regardless of which type or method of contraception you choose, it is important that you use condoms to protect against the transmission of sexually transmitted infections (STIs). Talk with your health care provider about which form of contraception is most appropriate for you. This information is not intended to replace advice given to you by your health care provider. Make sure you discuss any questions you have with your health care provider. Document Released: 10/15/2005 Document Revised: 03/22/2016 Document Reviewed: 04/09/2013 Elsevier Interactive Patient Education  2017 [employer]  
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
## 2016-11-01 LAB — GC/CHLAMYDIA PROBE AMP (~~LOC~~) NOT AT ARMC
Chlamydia: NEGATIVE
Neisseria Gonorrhea: NEGATIVE

## 2016-11-01 LAB — HIV ANTIBODY (ROUTINE TESTING W REFLEX): HIV 1&2 Ab, 4th Generation: NONREACTIVE

## 2016-11-01 LAB — RPR

## 2016-11-02 NOTE — Addendum Note (Signed)
Addended by: Faythe CasaBELLAMY, Lourie Retz M on: 11/02/2016 11:49 AM   Modules accepted: Orders

## 2016-11-05 LAB — CYTOLOGY - PAP: Diagnosis: NEGATIVE

## 2016-11-30 ENCOUNTER — Ambulatory Visit: Payer: Medicaid Other | Admitting: Neurology

## 2016-12-05 ENCOUNTER — Ambulatory Visit: Payer: Medicaid Other | Admitting: Neurology

## 2016-12-07 ENCOUNTER — Encounter: Payer: Self-pay | Admitting: Neurology

## 2017-01-04 ENCOUNTER — Emergency Department (HOSPITAL_COMMUNITY): Payer: Medicaid Other

## 2017-01-04 ENCOUNTER — Emergency Department (HOSPITAL_COMMUNITY)
Admission: EM | Admit: 2017-01-04 | Discharge: 2017-01-05 | Disposition: A | Discharge status: Home / Self Care | Payer: Medicaid Other | Attending: Emergency Medicine | Admitting: Emergency Medicine

## 2017-01-04 ENCOUNTER — Encounter (HOSPITAL_COMMUNITY): Payer: Self-pay | Admitting: Emergency Medicine

## 2017-01-04 DIAGNOSIS — Z79899 Other long term (current) drug therapy: Secondary | ICD-10-CM | POA: Diagnosis not present

## 2017-01-04 DIAGNOSIS — R519 Headache, unspecified: Secondary | ICD-10-CM

## 2017-01-04 DIAGNOSIS — R51 Headache: Secondary | ICD-10-CM | POA: Diagnosis present

## 2017-01-04 MED ORDER — CYCLOBENZAPRINE HCL 10 MG PO TABS: 10.0000 mg | ORAL_TABLET | Freq: Two times a day (BID) | ORAL | 0 refills | Status: DC | PRN

## 2017-01-04 MED ORDER — NAPROXEN 500 MG PO TABS: 500.0000 mg | ORAL_TABLET | Freq: Two times a day (BID) | ORAL | 0 refills | Status: DC

## 2017-01-04 MED ORDER — ONDANSETRON HCL 4 MG PO TABS: 4.0000 mg | ORAL_TABLET | Freq: Three times a day (TID) | ORAL | 0 refills | Status: DC | PRN

## 2017-01-04 MED ORDER — IBUPROFEN 800 MG PO TABS
800.0000 mg | ORAL_TABLET | Freq: Once | ORAL | Status: AC
  Administered 2017-01-04: 800 mg via ORAL
  Filled 2017-01-04: qty 1

## 2017-01-04 MED ORDER — ONDANSETRON 4 MG PO TBDP
4.0000 mg | ORAL_TABLET | Freq: Once | ORAL | Status: AC
  Administered 2017-01-04: 4 mg via ORAL
  Filled 2017-01-04: qty 1

## 2017-01-04 NOTE — ED Triage Notes (Addendum)
Patient c/o posterior headache and nausea x 3 days, patient reports that she vomited last night but none today. Patient denies any visual disturbances but has been dizzy. 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 States been told benign and they are just monitoring the cyst. Patient reports missed last appt couple weeks ago due to being out of town

## 2017-01-05 MED ORDER — PROMETHAZINE HCL 25 MG PO TABS: 25.0000 mg | ORAL_TABLET | Freq: Four times a day (QID) | ORAL | 0 refills | Status: DC | PRN

## 2017-01-05 NOTE — ED Provider Notes (Signed)
[facility] Provider Note   CSN: [id number] Arrival date & time: 01/04/17  1731     History   Chief Complaint Chief Complaint  Patient presents with  . Headache  . Nausea    HPI Kellie Davila is a 31 y.o. female.   Headache   This is a new problem. The current episode started more than 2 days ago. The problem has not changed since onset.The headache is associated with nothing. The pain is moderate. The pain does not radiate. Associated symptoms include nausea. Pertinent negatives include no anorexia, no orthopnea and no palpitations. She has tried nothing for the symptoms. The treatment provided no relief.    Past Medical History:  Diagnosis Date  . Anemia   . Anxiety    worse when on meds  . Vaginal Pap smear, abnormal    did bx, ok since    Patient Active Problem List   Diagnosis Date Noted  . Abdominal pain affecting pregnancy, antepartum 01/25/2016  . Previous cesarean delivery affecting pregnancy, antepartum 01/25/2016  . 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 Abnormal Pap smear of cervix 12/27/2014    Past Surgical History:  Procedure Laterality Date  . CESAREAN SECTION      OB History    Gravida Para Term Preterm AB Living   SAB TAB Ectopic Multiple Live Births   0 2 0 0 1       Home Medications    Prior to Admission medications   Medication Sig Start Date End Date Taking? Authorizing Provider  acetaminophen (TYLENOL) 500 MG tablet Take 500 mg by mouth every 6 (six) hours as needed for moderate pain.   Yes Historical Provider, MD  cyclobenzaprine (FLEXERIL) 10 MG tablet Take 1 tablet (10 mg total) by mouth 2 (two) times daily as needed for muscle spasms. 01/04/17   Marily Memos, MD  naproxen (NAPROSYN) 500 MG tablet Take 1 tablet (500 mg total) by mouth 2 (two) times daily. 01/04/17   Marily Memos, MD  ondansetron (ZOFRAN) 4 MG tablet Take 1 tablet (4 mg total) by mouth every 8 (eight) hours as needed for nausea or vomiting. 01/04/17    Marily Memos, MD  promethazine (PHENERGAN) 25 MG tablet Take 1 tablet (25 mg total) by mouth every 6 (six) hours as needed for nausea or vomiting. 01/05/17   Marily Memos, MD    Family History Family History  Problem Relation Age of Onset  . Alcohol abuse Neg Hx   . Arthritis Neg Hx   . Asthma Neg Hx   . Birth defects Neg Hx   . Cancer Neg Hx   . COPD Neg Hx   . Depression Neg Hx   . Diabetes Neg Hx   . Drug abuse Neg Hx   . Early death Neg Hx   . Hearing loss Neg Hx   . Heart disease Neg Hx   . Hyperlipidemia Neg Hx   . Hypertension Neg Hx   . Kidney disease Neg Hx   . Learning disabilities Neg Hx   . Mental illness Neg Hx   . Mental retardation Neg Hx   . Miscarriages / Stillbirths Neg Hx   . Stroke Neg Hx   . Vision loss Neg Hx     Social History Social History  Substance Use Topics  . Smoking status: Never Smoker  . Smokeless tobacco: Never Used  . Alcohol use No     Allergies   Compazine [  prochlorperazine edisylate] and Reglan [metoclopramide]   Review of Systems Review of Systems  Cardiovascular: Negative for palpitations and orthopnea.  Gastrointestinal: Positive for nausea. Negative for anorexia.  Neurological: Positive for headaches.  All other systems reviewed and are negative.    Physical Exam Updated Vital Signs BP 101/57   Pulse 62   Temp 97.8 F (36.6 C) (Oral)   Resp 18   Ht  (1.575 m)   Wt 170 lb (77.1 kg)   LMP 12/25/2016   SpO2 100%   BMI 31.09 kg/m   Physical Exam  Constitutional: She is oriented to person, place, and time. She appears well-developed and well-nourished.  HENT:  Head: Normocephalic and atraumatic.  Neck: Normal range of motion.  Cardiovascular: Normal rate and regular rhythm.   Pulmonary/Chest: No stridor. No respiratory distress.  Abdominal: She exhibits no distension.  Neurological: She is alert and oriented to person, place, and time. No cranial nerve deficit.  No altered mental status, able to give  full seemingly accurate history.  Face is symmetric, EOM's intact, pupils equal and reactive, vision intact, tongue and uvula midline without deviation Upper and Lower extremity motor 5/5, intact pain perception in distal extremities, 2+ reflexes in biceps, patella and achilles tendons. Finger to nose normal, heel to shin normal. Walks without assistance or evident ataxia.    Nursing note and vitals reviewed.    ED Treatments / Results  Labs (all labs ordered are listed, but only abnormal results are displayed) Labs Reviewed - No data to display  EKG  EKG Interpretation None       Radiology Ct Head Wo Contrast  Result Date: 01/04/2017 CLINICAL DATA:  Posterior headache and nausea EXAM: CT HEAD WITHOUT CONTRAST TECHNIQUE: Contiguous axial images were obtained from the base of the skull through the vertex without intravenous contrast. COMPARISON:  11/04/2015, 11/12/2012 FINDINGS: Brain: No acute territorial infarction or hemorrhage is visualized. Again visualized is a low-density left posterior fossa lesion measuring 13 mm, stable compared with 11/04/2015, increased in size compared with 11/12/2012. No ventricular enlargement. Vascular: No hyperdense vessel or unexpected calcification. Skull: Normal. Negative for fracture or focal lesion. Sinuses/Orbits: No acute finding. Other: None IMPRESSION: 1. No CT evidence for acute intracranial abnormality 2. 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 Nonemergent MRI follow-up may be obtained for further evaluation. Electronically Signed   By: Jasmine Pang M.D.   On: 01/04/2017 23:40    Procedures Procedures (including critical care time)  Medications Ordered in ED Medications  ondansetron (ZOFRAN-ODT) disintegrating tablet 4 mg (4 mg Oral Given 01/04/17 2231)  ibuprofen (ADVIL,MOTRIN) tablet 800 mg (800 mg Oral Given 01/04/17 2232)     Initial Impression /  Assessment and Plan / ED Course  I have reviewed the triage vital signs and the nursing notes.  Pertinent labs & imaging results that were available during my care of the patient were reviewed by me and considered in my medical decision making (see chart for details).     Headache and vomiting. Has a known cyst and is concerned might be worse. She did miss her appointment with her doctors this year. CT scan however was negative for any changes. Patient's symptoms improved with by mouth medications will be discharged on the same.  Final Clinical Impressions(s) / ED Diagnoses   Final diagnoses:  Acute nonintractable headache, unspecified headache type    New Prescriptions Discharge Medication List as of 01/04/2017  11:51 PM    START taking these medications   Details  ondansetron (ZOFRAN) 4 MG tablet Take 1 tablet (4 mg total) by mouth every 8 (eight) hours as needed for nausea or vomiting., Starting Fri 01/04/2017, Print         Marily Memos, MD 01/05/17 [phone]

## 2017-02-05 ENCOUNTER — Ambulatory Visit: Payer: Medicaid Other

## 2017-02-05 ENCOUNTER — Other Ambulatory Visit (HOSPITAL_COMMUNITY)
Admission: RE | Admit: 2017-02-05 | Discharge: 2017-02-05 | Disposition: A | Discharge status: Home / Self Care | Payer: Medicaid Other | Source: Ambulatory Visit | Attending: Obstetrics and Gynecology | Admitting: Obstetrics and Gynecology

## 2017-02-05 DIAGNOSIS — N760 Acute vaginitis: Secondary | ICD-10-CM | POA: Insufficient documentation

## 2017-02-05 DIAGNOSIS — B9689 Other specified bacterial agents as the cause of diseases classified elsewhere: Secondary | ICD-10-CM | POA: Insufficient documentation

## 2017-02-05 DIAGNOSIS — R52 Pain, unspecified: Secondary | ICD-10-CM

## 2017-02-05 DIAGNOSIS — N898 Other specified noninflammatory disorders of vagina: Secondary | ICD-10-CM | POA: Diagnosis present

## 2017-02-05 LAB — POCT URINALYSIS DIP (DEVICE)
BILIRUBIN URINE: NEGATIVE
Glucose, UA: NEGATIVE mg/dL
KETONES UR: NEGATIVE mg/dL
Leukocytes, UA: NEGATIVE
Nitrite: POSITIVE — AB
PH: 7 (ref 5.0–8.0)
Protein, ur: NEGATIVE mg/dL
Specific Gravity, Urine: 1.015 (ref 1.005–1.030)
Urobilinogen, UA: 0.2 mg/dL (ref 0.0–1.0)

## 2017-02-05 MED ORDER — FOSFOMYCIN TROMETHAMINE 3 G PO PACK: 3.0000 g | PACK | Freq: Once | ORAL | 0 refills | Status: AC

## 2017-02-05 MED ORDER — NITROFURANTOIN MONOHYD MACRO 100 MG PO CAPS: 100.0000 mg | ORAL_CAPSULE | Freq: Two times a day (BID) | ORAL | 0 refills | Status: DC

## 2017-02-05 NOTE — Progress Notes (Signed)
Patient presented to office today for urine check and wet prep self swab. Patient reports having back pain and some dysuria for the past couple of days. Patient also reports some discharge and has requested to do a self swab to check for yeast and BV. Patient instructed on how to self swab and urine collection. Patient was positive for some nitrate and blood in urine. Per Protocol will call in medications to help with symptoms. Patient is requesting something other then Macrobid.Urine culture will be sent to lab.Patient voice understanding at this time.

## 2017-02-06 ENCOUNTER — Telehealth: Payer: Self-pay | Admitting: General Practice

## 2017-02-06 ENCOUNTER — Ambulatory Visit (HOSPITAL_COMMUNITY)
Admission: EM | Admit: 2017-02-06 | Discharge: 2017-02-06 | Disposition: A | Discharge status: Home / Self Care | Payer: Medicaid Other | Attending: Family Medicine | Admitting: Family Medicine

## 2017-02-06 ENCOUNTER — Encounter (HOSPITAL_COMMUNITY): Payer: Self-pay | Admitting: Emergency Medicine

## 2017-02-06 DIAGNOSIS — B9689 Other specified bacterial agents as the cause of diseases classified elsewhere: Secondary | ICD-10-CM

## 2017-02-06 DIAGNOSIS — J039 Acute tonsillitis, unspecified: Secondary | ICD-10-CM

## 2017-02-06 DIAGNOSIS — N760 Acute vaginitis: Principal | ICD-10-CM

## 2017-02-06 LAB — CERVICOVAGINAL ANCILLARY ONLY
BACTERIAL VAGINITIS: POSITIVE — AB
Chlamydia: NEGATIVE
Neisseria Gonorrhea: NEGATIVE
TRICH (WINDOWPATH): NEGATIVE

## 2017-02-06 MED ORDER — CHLORHEXIDINE GLUCONATE 0.12 % MT SOLN: 15.0000 mL | Freq: Two times a day (BID) | OROMUCOSAL | 0 refills | Status: DC

## 2017-02-06 MED ORDER — METRONIDAZOLE 0.75 % VA GEL: 1.0000 | Freq: Every day | VAGINAL | 0 refills | Status: AC

## 2017-02-06 MED ORDER — AMOXICILLIN 500 MG PO CAPS: 500.0000 mg | ORAL_CAPSULE | Freq: Three times a day (TID) | ORAL | 0 refills | Status: DC

## 2017-02-06 NOTE — ED Triage Notes (Signed)
Pt complains of sinus congestion, headache, and sore throat since Monday night.  She denies any fever.

## 2017-02-06 NOTE — Telephone Encounter (Signed)
Called patient and informed her of BV. Patient verbalized understanding and will take metrogel.

## 2017-02-06 NOTE — ED Provider Notes (Signed)
[facility]    CSN: [id number] Arrival date & time: 02/06/17  1531     History   Chief Complaint Chief Complaint  Patient presents with  . Sinusitis    HPI Kellie Davila is a 31 y.o. female.   Pt complains of sinus congestion, headache, and sore throat since Monday night.  She denies any fever. Chest has some pressure in her left ear.  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      Past Medical History:  Diagnosis Date  . Anemia   . Anxiety    worse when on meds  . Vaginal Pap smear, abnormal    did bx, ok since    Patient Active Problem List   Diagnosis Date Noted  . Abdominal pain affecting pregnancy, antepartum 01/25/2016  . Previous cesarean delivery affecting pregnancy, antepartum 01/25/2016  . 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 Abnormal Pap smear of cervix 12/27/2014    Past Surgical History:  Procedure Laterality Date  . CESAREAN SECTION      OB History    Gravida Para Term Preterm AB Living   SAB TAB Ectopic Multiple Live Births   0 2 0 0 1       Home Medications    Prior to Admission medications   Medication Sig Start Date End Date Taking? Authorizing Provider  acetaminophen (TYLENOL) 500 MG tablet Take 500 mg by mouth every 6 (six) hours as needed for moderate pain.    Historical Provider, MD  amoxicillin (AMOXIL) 500 MG capsule Take 1 capsule (500 mg total) by mouth 3 (three) times daily. 02/06/17   Elvina Sidle, MD  chlorhexidine (PERIDEX) 0.12 % solution Use as directed 15 mLs in the mouth or throat 2 (two) times daily. 02/06/17   Elvina Sidle, MD  metroNIDAZOLE (METROGEL VAGINAL) 0.75 % vaginal gel Place 1 Applicatorful vaginally at bedtime. 02/06/17 02/11/17  Reva Bores, MD  promethazine (PHENERGAN) 25 MG tablet Take 1 tablet (25 mg total) by mouth every 6 (six) hours as needed for nausea or vomiting. Patient not taking: Reported on 02/05/2017 01/05/17   Marily Memos, MD    Family History Family History    Problem Relation Age of Onset  . Alcohol abuse Neg Hx   . Arthritis Neg Hx   . Asthma Neg Hx   . Birth defects Neg Hx   . Cancer Neg Hx   . COPD Neg Hx   . Depression Neg Hx   . Diabetes Neg Hx   . Drug abuse Neg Hx   . Early death Neg Hx   . Hearing loss Neg Hx   . Heart disease Neg Hx   . Hyperlipidemia Neg Hx   . Hypertension Neg Hx   . Kidney disease Neg Hx   . Learning disabilities Neg Hx   . Mental illness Neg Hx   . Mental retardation Neg Hx   . Miscarriages / Stillbirths Neg Hx   . Stroke Neg Hx   . Vision loss Neg Hx     Social History Social History  Substance Use Topics  . Smoking status: Never Smoker  . Smokeless tobacco: Never Used  . Alcohol use No     Allergies   Compazine [prochlorperazine edisylate] and Reglan [metoclopramide]   Review of Systems Review of Systems  Constitutional: Positive for fatigue. Negative for fever.  HENT: Positive for congestion, ear pain and sore throat.   Respiratory: Negative.  Gastrointestinal: Positive for nausea.  Neurological: Positive for headaches.  All other systems reviewed and are negative.    Physical Exam Triage Vital Signs ED Triage Vitals  Enc Vitals Group     BP 02/06/17 1545 102/69     Pulse Rate 02/06/17 1545 92     Resp --      Temp 02/06/17 1545 98.3 F (36.8 C)     Temp Source 02/06/17 1545 Oral     SpO2 02/06/17 1545 100 %     Weight --      Height --      Head Circumference --      Peak Flow --      Pain Score 02/06/17 1543 7     Pain Loc --      Pain Edu? --      Excl. in GC? --    No data found.   Updated Vital Signs BP 102/69 (BP Location: Left Arm)   Pulse 92   Temp 98.3 F (36.8 C) (Oral)   LMP 01/27/2017 (Exact Date)   SpO2 100%    Physical Exam  Constitutional: She is oriented to person, place, and time. She appears well-developed and well-nourished.  HENT:  Right Ear: External ear normal.  Left Ear: External ear normal.  Mouth/Throat: Oropharyngeal exudate  present.  Left tonsil is swollen and the entire posterior pharynx is reddened. There is scant exudate on left side as well.  Eyes: Conjunctivae and EOM are normal. Pupils are equal, round, and reactive to light.  Neck: Normal range of motion. Neck supple.  Pulmonary/Chest: Effort normal.  Musculoskeletal: Normal range of motion.  Neurological: She is alert and oriented to person, place, and time.  Skin: Skin is warm and dry.  Nursing note and vitals reviewed.    UC Treatments / Results  Labs (all labs ordered are listed, but only abnormal results are displayed) Labs Reviewed - No data to display  EKG  EKG Interpretation None       Radiology No results found.  Procedures Procedures (including critical care time)  Medications Ordered in UC Medications - No data to display   Initial Impression / Assessment and Plan / UC Course  I have reviewed the triage vital signs and the nursing notes.  Pertinent labs & imaging results that were available during my care of the patient were reviewed by me and considered in my medical decision making (see chart for details).     Final Clinical Impressions(s) / UC Diagnoses   Final diagnoses:  Tonsillitis    New Prescriptions New Prescriptions   AMOXICILLIN (AMOXIL) 500 MG CAPSULE    Take 1 capsule (500 mg total) by mouth 3 (three) times daily.   CHLORHEXIDINE (PERIDEX) 0.12 % SOLUTION    Use as directed 15 mLs in the mouth or throat 2 (two) times daily.     Elvina Sidle, MD 02/06/17 [phone]

## 2017-02-06 NOTE — Telephone Encounter (Signed)
Patient called and left message stating she is still having vaginal irritation/itching. Patient states she was told she had a UTI but doesn't know about her swab results. Patient states she is still having discomfort. Called patient and discussed that her results are not back yet but should be back by the end of the day. Patient became irritated and said she has never had to wait this long for results and doesn't know if she just needs to go to [facility] or what. Asked patient what symptoms she was having. Patient reports a lot of irritation/discomfort, a grayish d/c and an odor. Patient states it doesn't feel like a yeast infection because she isn't itching like that and she has had one before. Patient states it feels more like a bacterial infection to her. Told patient it certainly sounds like that to me and discussed that we could send in flagyl until her results come back and then change or discontinue if we need to then. Patient verbalized understanding and asked for the cream/gel instead. Told patient we can do that for her. Patient verbalized understanding and had no questions

## 2017-02-12 ENCOUNTER — Telehealth: Payer: Self-pay | Admitting: General Practice

## 2017-02-12 DIAGNOSIS — B379 Candidiasis, unspecified: Secondary | ICD-10-CM

## 2017-02-12 DIAGNOSIS — T3695XA Adverse effect of unspecified systemic antibiotic, initial encounter: Principal | ICD-10-CM

## 2017-02-12 MED ORDER — FLUCONAZOLE 150 MG PO TABS: 150.0000 mg | ORAL_TABLET | Freq: Once | ORAL | 0 refills | Status: AC

## 2017-02-12 NOTE — Telephone Encounter (Signed)
Called patient to inquire if she was feeling better after BV treatment or was she still having burning/irritation. Per chart review, patient could have a UTI based off UA results on 4/11. Patient states she is feeling better. States she was recently treated with amoxicillin for an infection so she thinks that helped her bladder. Patient does report a creamy white itchy discharge. Told patient we can send diflucan to pharmacy for yeast infection. Patient verbalized understanding & had no questions
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
## 2017-02-28 ENCOUNTER — Ambulatory Visit: Payer: Medicaid Other | Admitting: General Practice

## 2017-02-28 DIAGNOSIS — B9689 Other specified bacterial agents as the cause of diseases classified elsewhere: Secondary | ICD-10-CM

## 2017-02-28 DIAGNOSIS — B379 Candidiasis, unspecified: Secondary | ICD-10-CM

## 2017-02-28 DIAGNOSIS — N760 Acute vaginitis: Principal | ICD-10-CM

## 2017-02-28 MED ORDER — METRONIDAZOLE 500 MG PO TABS: 500.0000 mg | ORAL_TABLET | Freq: Two times a day (BID) | ORAL | 0 refills | Status: DC

## 2017-02-28 MED ORDER — FLUCONAZOLE 150 MG PO TABS: 150.0000 mg | ORAL_TABLET | Freq: Once | ORAL | 0 refills | Status: AC

## 2017-02-28 NOTE — Progress Notes (Signed)
Patient here today for vaginal discharge with odor and itching. Patient tested positive 4/10 for BV and was treated with metrogel. Discussed with patient that I do not think we need her to reswab today as she was just tested. Discussed sending flagyl & diflucan to her pharmacy. Recommended she take flagyl with food to prevent stomach irritation and to not consume any alcohol while on the medication. Recommended she take the diflucan after completion of flagyl. Also discussed using no scented products as well as douching around the vagina and recommended dove unscented soap. Patient verbalized understanding to all & had no questions

## 2017-03-28 ENCOUNTER — Other Ambulatory Visit: Payer: Self-pay | Admitting: *Deleted

## 2017-04-13 ENCOUNTER — Encounter (HOSPITAL_BASED_OUTPATIENT_CLINIC_OR_DEPARTMENT_OTHER): Payer: Self-pay | Admitting: *Deleted

## 2017-04-13 ENCOUNTER — Emergency Department (HOSPITAL_BASED_OUTPATIENT_CLINIC_OR_DEPARTMENT_OTHER)
Admission: EM | Admit: 2017-04-13 | Discharge: 2017-04-13 | Disposition: A | Discharge status: Home / Self Care | Payer: Medicaid Other | Attending: Emergency Medicine | Admitting: Emergency Medicine

## 2017-04-13 DIAGNOSIS — H5711 Ocular pain, right eye: Secondary | ICD-10-CM | POA: Diagnosis not present

## 2017-04-13 MED ORDER — KETOROLAC TROMETHAMINE 0.4 % OP SOLN: 1.0000 [drp] | Freq: Four times a day (QID) | OPHTHALMIC | 0 refills | Status: AC

## 2017-04-13 MED ORDER — TETRACAINE HCL 0.5 % OP SOLN
OPHTHALMIC | Status: AC
  Filled 2017-04-13: qty 4

## 2017-04-13 MED ORDER — FLUORESCEIN SODIUM 0.6 MG OP STRP
1.0000 | ORAL_STRIP | Freq: Once | OPHTHALMIC | Status: AC
  Administered 2017-04-13: 1 via OPHTHALMIC
  Filled 2017-04-13: qty 1

## 2017-04-13 MED ORDER — TETRACAINE HCL 0.5 % OP SOLN
1.0000 [drp] | Freq: Once | OPHTHALMIC | Status: AC
  Administered 2017-04-13: 1 [drp] via OPHTHALMIC
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
## 2017-04-13 NOTE — Discharge Instructions (Signed)
You may manage you eye pain with the eyedrops, Tylenol, or ibuprofen. Try not to touch your eye, as this can cause further irritation and pain. Return to the ED if you experienced double vision, blurry vision, vision changes, or worsening eye pain.
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
## 2017-04-15 ENCOUNTER — Encounter (HOSPITAL_COMMUNITY): Payer: Self-pay | Admitting: Emergency Medicine

## 2017-04-15 ENCOUNTER — Ambulatory Visit (HOSPITAL_COMMUNITY)
Admission: EM | Admit: 2017-04-15 | Discharge: 2017-04-15 | Disposition: A | Discharge status: Home / Self Care | Payer: Medicaid Other | Attending: Family Medicine | Admitting: Family Medicine

## 2017-04-15 DIAGNOSIS — H578 Other specified disorders of eye and adnexa: Secondary | ICD-10-CM | POA: Diagnosis not present

## 2017-04-15 DIAGNOSIS — H5789 Other specified disorders of eye and adnexa: Secondary | ICD-10-CM

## 2017-04-15 MED ORDER — POLYETHYL GLYCOL-PROPYL GLYCOL 0.4-0.3 % OP GEL: 1.0000 "application " | Freq: Every day | OPHTHALMIC | 0 refills | Status: DC

## 2017-04-15 NOTE — ED Triage Notes (Signed)
Pt c/o persistent right eye pain/irritation onset 2 days  [facility]... Was given Ketorolac soln w/no relief.   A&O x4... NAD... Ambulatory
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
## 2017-04-15 NOTE — Discharge Instructions (Signed)
You had an injury to the eye, and will need time for it to heal. Continue to Ketorolac eye drop you were prescribed in the ED. Continue Systane Ultra for dry eyes. Start eye ointment for dry eyes at night. Allow 5-10 mins between any eye drops. It will take some time to heal your eye and no longer feel the irritation.

## 2017-04-17 ENCOUNTER — Other Ambulatory Visit (HOSPITAL_COMMUNITY)
Admission: RE | Admit: 2017-04-17 | Discharge: 2017-04-17 | Disposition: A | Discharge status: Home / Self Care | Payer: Medicaid Other | Source: Ambulatory Visit | Attending: Obstetrics and Gynecology | Admitting: Obstetrics and Gynecology

## 2017-04-17 ENCOUNTER — Ambulatory Visit (INDEPENDENT_AMBULATORY_CARE_PROVIDER_SITE_OTHER): Payer: Medicaid Other | Admitting: *Deleted

## 2017-04-17 VITALS — BP 103/66 | HR 59

## 2017-04-17 DIAGNOSIS — N898 Other specified noninflammatory disorders of vagina: Secondary | ICD-10-CM | POA: Diagnosis not present

## 2017-04-17 DIAGNOSIS — N760 Acute vaginitis: Secondary | ICD-10-CM | POA: Insufficient documentation

## 2017-04-17 DIAGNOSIS — B9689 Other specified bacterial agents as the cause of diseases classified elsewhere: Secondary | ICD-10-CM

## 2017-04-17 MED ORDER — METRONIDAZOLE 0.75 % VA GEL: 1.0000 | Freq: Every day | VAGINAL | 0 refills | Status: AC

## 2017-04-17 NOTE — Progress Notes (Signed)
Patient presents to clinic for self swab d/t vaginal discharge with odor. Patient has had recurrent BV and feels like this is what is happening. Went on and sent in Metrogel (patient preference) per protocol to pharmacy. Patient provided self swab. I told her I would call her if it shows anything else. Patient voiced understanding.

## 2017-04-18 LAB — CERVICOVAGINAL ANCILLARY ONLY
BACTERIAL VAGINITIS: POSITIVE — AB
CANDIDA VAGINITIS: NEGATIVE
Chlamydia: NEGATIVE
NEISSERIA GONORRHEA: NEGATIVE
TRICH (WINDOWPATH): NEGATIVE

## 2017-04-23 ENCOUNTER — Other Ambulatory Visit: Payer: Self-pay

## 2017-04-23 MED ORDER — METRONIDAZOLE 500 MG PO TABS: 500.0000 mg | ORAL_TABLET | Freq: Two times a day (BID) | ORAL | 1 refills | Status: DC

## 2017-04-23 NOTE — Telephone Encounter (Signed)
Called patient to let her know her wet prep showed BV. Will call in flagyl for patient with one refill.

## 2017-05-08 ENCOUNTER — Ambulatory Visit (INDEPENDENT_AMBULATORY_CARE_PROVIDER_SITE_OTHER): Payer: Medicaid Other | Admitting: Neurology

## 2017-05-08 ENCOUNTER — Encounter: Payer: Self-pay | Admitting: Neurology

## 2017-05-08 VITALS — BP 110/80 | HR 78 | Ht 62.0 in | Wt 172.0 lb

## 2017-05-08 DIAGNOSIS — G930 Cerebral cysts: Secondary | ICD-10-CM | POA: Diagnosis not present

## 2017-05-08 NOTE — Progress Notes (Signed)
NEUROLOGY FOLLOW UP OFFICE NOTE  Kellie Davila [id number]  HISTORY OF PRESENT ILLNESS: Kellie Davila is a 31 year old right-handed female who follows up for cerebellar cyst.  Recent history obtained by patient and ED note from March.  Imaging of head CT from 2014, 2017 and 2018 personally reviewed.  Kellie Davila has a history of migraines, which are usually controlled.  She has had head CTs in the past to evaluate her headaches.  CT of head from 11/12/12 showed a 7mm cyst on the left side of the posterior fossa, indenting the cerebellum.  She did not have any follow up, but she had a repeat head CT on 11/04/15 to again evaluate migraine, and it revealed that the cyst was larger, this time about 13 mm.  She followed up with me soon afterwards.  I ordered an MRI but she never had it performed and did not follow up.  She most recently was in the ED for headache on 01/04/17, which a CT was again performed and demonstrated that the cerebellar cyst was comparable in size to the previous CT from last year.  She presents today to follow up.  Again, she feels well.  She denies facial numbness, facial weakness, tinnitus, hearing loss or vertigo.    08/30/16 LABS: CBC with WBC 5.6, HGB 11.3, HCT 32.6, MCV 82.5, PLT 335; CMP with Na 137, K 3.9, Cl 104, CO2 28, glucose 93, BUN 12, Cr 0.88, total bili 0.6, ALP 34, AST 29 and ALT 29.  PAST MEDICAL HISTORY: Past Medical History:  Diagnosis Date  . Anemia   . Anxiety    worse when on meds  . Vaginal Pap smear, abnormal    did bx, ok since    MEDICATIONS: Current Outpatient Prescriptions on File Prior to Visit  Medication Sig Dispense Refill  . acetaminophen (TYLENOL) 500 MG tablet Take 500 mg by mouth every 6 (six) hours as needed for moderate pain.     No current facility-administered medications on file prior to visit.     ALLERGIES: Allergies  Allergen Reactions  . Compazine [Prochlorperazine Edisylate] Anxiety  . Reglan [Metoclopramide] Anxiety      FAMILY HISTORY: Family History  Problem Relation Age of Onset  . Alcohol abuse Neg Hx   . Arthritis Neg Hx   . Asthma Neg Hx   . Birth defects Neg Hx   . Cancer Neg Hx   . COPD Neg Hx   . Depression Neg Hx   . Diabetes Neg Hx   . Drug abuse Neg Hx   . Early death Neg Hx   . Hearing loss Neg Hx   . Heart disease Neg Hx   . Hyperlipidemia Neg Hx   . Hypertension Neg Hx   . Kidney disease Neg Hx   . Learning disabilities Neg Hx   . Mental illness Neg Hx   . Mental retardation Neg Hx   . Miscarriages / Stillbirths Neg Hx   . Stroke Neg Hx   . Vision loss Neg Hx     SOCIAL HISTORY: Social History   Social History  . Marital status: Single    Spouse name: N/A  . Number of children: N/A  . Years of education: N/A   Occupational History  . Not on file.   Social History Main Topics  . Smoking status: Never Smoker  . Smokeless tobacco: Never Used  . Alcohol use No  . Drug use: No  . Sexual activity: Yes  Birth control/ protection: Condom   Other Topics Concern  . Not on file   Social History Narrative  . No narrative on file    REVIEW OF SYSTEMS: Constitutional: No fevers, chills, or sweats, no generalized fatigue, change in appetite Eyes: No visual changes, double vision, eye pain Ear, nose and throat: No hearing loss, ear pain, nasal congestion, sore throat Cardiovascular: No chest pain, palpitations Respiratory:  No shortness of breath at rest or with exertion, wheezes GastrointestinaI: No nausea, vomiting, diarrhea, abdominal pain, fecal incontinence Genitourinary:  No dysuria, urinary retention or frequency Musculoskeletal:  No neck pain, back pain Integumentary: No rash, pruritus, skin lesions Neurological: as above Psychiatric: No depression, insomnia, anxiety Endocrine: No palpitations, fatigue, diaphoresis, mood swings, change in appetite, change in weight, increased thirst Hematologic/Lymphatic:  No purpura, petechiae. Allergic/Immunologic:  no itchy/runny eyes, nasal congestion, recent allergic reactions, rashes  PHYSICAL EXAM: Vitals:   05/08/17 1151  BP: 110/80  Pulse: 78   General: No acute distress.  Patient appears well-groomed.  Head:  Normocephalic/atraumatic Eyes:  Fundi examined but not visualized Neck: supple, no paraspinal tenderness, full range of motion Heart:  Regular rate and rhythm Lungs:  Clear to auscultation bilaterally Back: No paraspinal tenderness Neurological Exam: alert and oriented to person, place, and time. Attention span and concentration intact, recent and remote memory intact, fund of knowledge intact.  Speech fluent and not dysarthric, language intact.  CN II-XII intact. Bulk and tone normal, muscle strength 5/5 throughout.  Sensation to light touch, temperature and vibration intact.  Deep tendon reflexes 2+ throughout, toes downgoing.  Finger to nose and heel to shin testing intact.  Gait normal, Romberg negative.  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  Size appears stable compared to last year.  She is asymptomatic.   PLAN: 1.  To better visualize the cyst, we will reorder MRI of brain with and without contrast. 2.  Further recommendations pending results.  Shon Millet, DO

## 2017-05-08 NOTE — Patient Instructions (Signed)
1.  We will get an MRI of the brain with and without contrast to look at the cyst better.  The size has not changed since last year. 2.  Further recommendations after MRI is reviewed.

## 2017-05-21 ENCOUNTER — Ambulatory Visit
Admission: RE | Admit: 2017-05-21 | Discharge: 2017-05-21 | Disposition: A | Discharge status: Home / Self Care | Payer: Medicaid Other | Source: Ambulatory Visit | Attending: Neurology | Admitting: Neurology

## 2017-05-21 DIAGNOSIS — G930 Cerebral cysts: Secondary | ICD-10-CM

## 2017-05-27 ENCOUNTER — Encounter (HOSPITAL_COMMUNITY): Payer: Self-pay | Admitting: *Deleted

## 2017-05-27 ENCOUNTER — Ambulatory Visit (HOSPITAL_COMMUNITY)
Admission: EM | Admit: 2017-05-27 | Discharge: 2017-05-27 | Disposition: A | Discharge status: Home / Self Care | Payer: Medicaid Other | Attending: Family Medicine | Admitting: Family Medicine

## 2017-05-27 DIAGNOSIS — K529 Noninfective gastroenteritis and colitis, unspecified: Secondary | ICD-10-CM | POA: Diagnosis not present

## 2017-05-27 MED ORDER — OMEPRAZOLE 40 MG PO CPDR: 40.0000 mg | DELAYED_RELEASE_CAPSULE | Freq: Two times a day (BID) | ORAL | 0 refills | Status: DC

## 2017-05-27 MED ORDER — PROMETHAZINE HCL 25 MG PO TABS: 25.0000 mg | ORAL_TABLET | Freq: Four times a day (QID) | ORAL | 0 refills | Status: DC | PRN

## 2017-05-27 MED ORDER — DICYCLOMINE HCL 20 MG PO TABS: 20.0000 mg | ORAL_TABLET | Freq: Two times a day (BID) | ORAL | 0 refills | Status: DC

## 2017-05-27 NOTE — Discharge Instructions (Signed)
You most likely have viral gastritis. I prescribed medications to help your symptoms. Forr Nausea,I prescribed phenergan, one tablet every 6 hours as needed. For abdominal pain and cramping I prescribed a medicine called Bentyl, take one tablet by mouth twice daily. And finally have prescribed a medicine called omeprazole, take 1 tablet by mouth twice daily. Should your symptoms persist, or fail to improve, follow up with your primary care provider or return to clinic as needed. If your symptoms worsen, particularly if you develop worsened abdominal pain, fever, signs or symptoms of severe dehydration, then go to the emergency room.

## 2017-05-27 NOTE — ED Provider Notes (Signed)
CSN: [id number]     Arrival date & time 05/27/17  1427 History   First MD Initiated Contact with Patient 05/27/17 1536     Chief Complaint  Patient presents with  . Emesis   (Consider location/radiation/quality/duration/timing/severity/associated sxs/prior Treatment) 31 year old female presents to clinic for evaluation of fall or history of nausea, vomiting, diarrhea. Reports she has vomited 4 times, had diarrhea twice, along with some abdominal cramping. Has not had any foreign travel, no exposures, no suspicious food. No fever, no chills, weakness, myalgias, or other symptoms. No concerned about pregnancy, stating she is not currently sexually active, not on birth control.   The history is provided by the patient.    Past Medical History:  Diagnosis Date  . Anemia   . Anxiety    worse when on meds  . Vaginal Pap smear, abnormal    did bx, ok since   Past Surgical History:  Procedure Laterality Date  . CESAREAN SECTION     Family History  Problem Relation Age of Onset  . Alcohol abuse Neg Hx   . Arthritis Neg Hx   . Asthma Neg Hx   . Birth defects Neg Hx   . Cancer Neg Hx   . COPD Neg Hx   . Depression Neg Hx   . Diabetes Neg Hx   . Drug abuse Neg Hx   . Early death Neg Hx   . Hearing loss Neg Hx   . Heart disease Neg Hx   . Hyperlipidemia Neg Hx   . Hypertension Neg Hx   . Kidney disease Neg Hx   . Learning disabilities Neg Hx   . Mental illness Neg Hx   . Mental retardation Neg Hx   . Miscarriages / Stillbirths Neg Hx   . Stroke Neg Hx   . Vision loss Neg Hx    Social History  Substance Use Topics  . Smoking status: Never Smoker  . Smokeless tobacco: Never Used  . Alcohol use No   OB History    Gravida Para Term Preterm AB Living   3 1 1   2 1    SAB TAB Ectopic Multiple Live Births   0 2 0 0 1     Review of Systems  Constitutional: Negative.   HENT: Negative.   Respiratory: Negative.   Cardiovascular: Negative.   Gastrointestinal: Positive for  abdominal pain, diarrhea, nausea and vomiting. Negative for blood in stool and constipation.  Genitourinary: Negative.   Musculoskeletal: Negative.   Skin: Negative.   Neurological: Negative.     Allergies  Compazine [prochlorperazine edisylate] and Reglan [metoclopramide]  Home Medications   Prior to Admission medications   Medication Sig Start Date End Date Taking? Authorizing Provider  acetaminophen (TYLENOL) 500 MG tablet Take 500 mg by mouth every 6 (six) hours as needed for moderate pain.    [provider]  dicyclomine (BENTYL) 20 MG tablet Take 1 tablet (20 mg total) by mouth 2 (two) times daily. 05/27/17   Dorena BodoKennard, Ying Blankenhorn, NP  omeprazole (PRILOSEC) 40 MG capsule Take 1 capsule (40 mg total) by mouth 2 (two) times daily. 05/27/17 06/10/17  Dorena BodoKennard, Tobin Cadiente, NP  promethazine (PHENERGAN) 25 MG tablet Take 1 tablet (25 mg total) by mouth every 6 (six) hours as needed for nausea or vomiting. 05/27/17   Dorena BodoKennard, Wentworth Edelen, NP   Meds Ordered and Administered this Visit  Medications - No data to display  BP 99/60 (BP Location: Left Arm)   Pulse 60   Temp 98.6  F (37 C) (Oral)   Resp 16   LMP 05/03/2017   SpO2 100%  No data found.   Physical Exam  Constitutional: She is oriented to person, place, and time. She appears well-developed and well-nourished. No distress.  HENT:  Head: Normocephalic and atraumatic.  Right Ear: External ear normal.  Left Ear: External ear normal.  Eyes: Conjunctivae are normal.  Neck: Normal range of motion.  Cardiovascular: Normal rate and regular rhythm.   Pulmonary/Chest: Effort normal.  Abdominal: Soft. Normal appearance and bowel sounds are normal. There is tenderness in the right upper quadrant, epigastric area and periumbilical area. There is no rebound, no CVA tenderness, no tenderness at McBurney's point and negative Murphy's sign.  Neurological: She is alert and oriented to person, place, and time.  Skin: Skin is warm and dry.  Capillary refill takes less than 2 seconds. No rash noted. She is not diaphoretic. No erythema.  Psychiatric: She has a normal mood and affect. Her behavior is normal.  Nursing note and vitals reviewed.   Urgent Care Course     Procedures (including critical care time)  Labs Review Labs Reviewed - No data to display  Imaging Review No results found.   Visual Acuity Review  Right Eye Distance:   Left Eye Distance:   Bilateral Distance:    Right Eye Near:   Left Eye Near:    Bilateral Near:         MDM   1. Gastroenteritis     Most likely viral gastroenteritis, given Phenergan for nausea, Prilosec and Bentyl for abdominal pain, cramping, and reflux. Clear liquid diet, followed by a bland diet, if symptoms worsen To the ER.    Dorena BodoKennard, Onda Kattner, NP 05/27/17 1729

## 2017-05-27 NOTE — ED Triage Notes (Signed)
Pt  Reports  Nausea   Vomiting    And    Diarrhea            Started  Today  No  One  Else    Ill  At  Home         Mild   abd  Cramping  At times

## 2017-06-03 ENCOUNTER — Ambulatory Visit: Admission: RE | Admit: 2017-06-03 | Payer: Medicaid Other | Source: Ambulatory Visit

## 2017-06-13 ENCOUNTER — Inpatient Hospital Stay
Admission: RE | Admit: 2017-06-13 | Discharge: 2017-06-13 | Disposition: A | Discharge status: Home / Self Care | Payer: Medicaid Other | Source: Ambulatory Visit | Attending: Neurology | Admitting: Neurology

## 2017-07-05 ENCOUNTER — Inpatient Hospital Stay (HOSPITAL_COMMUNITY)
Admission: AD | Admit: 2017-07-05 | Discharge: 2017-07-05 | Disposition: A | Discharge status: Home / Self Care | Payer: Medicaid Other | Source: Ambulatory Visit | Attending: Obstetrics and Gynecology | Admitting: Obstetrics and Gynecology

## 2017-07-05 ENCOUNTER — Encounter (HOSPITAL_COMMUNITY): Payer: Self-pay

## 2017-07-05 DIAGNOSIS — B9689 Other specified bacterial agents as the cause of diseases classified elsewhere: Secondary | ICD-10-CM | POA: Diagnosis present

## 2017-07-05 DIAGNOSIS — Z3202 Encounter for pregnancy test, result negative: Secondary | ICD-10-CM | POA: Insufficient documentation

## 2017-07-05 DIAGNOSIS — N760 Acute vaginitis: Secondary | ICD-10-CM | POA: Insufficient documentation

## 2017-07-05 DIAGNOSIS — N898 Other specified noninflammatory disorders of vagina: Secondary | ICD-10-CM | POA: Diagnosis present

## 2017-07-05 LAB — URINALYSIS, ROUTINE W REFLEX MICROSCOPIC
Bilirubin Urine: NEGATIVE
GLUCOSE, UA: NEGATIVE mg/dL
HGB URINE DIPSTICK: NEGATIVE
Ketones, ur: NEGATIVE mg/dL
Leukocytes, UA: NEGATIVE
Nitrite: NEGATIVE
Protein, ur: NEGATIVE mg/dL
Specific Gravity, Urine: 1.013 (ref 1.005–1.030)
pH: 5 (ref 5.0–8.0)

## 2017-07-05 LAB — POCT PREGNANCY, URINE: PREG TEST UR: NEGATIVE

## 2017-07-05 LAB — WET PREP, GENITAL
SPERM: NONE SEEN
TRICH WET PREP: NONE SEEN
YEAST WET PREP: NONE SEEN

## 2017-07-05 MED ORDER — METRONIDAZOLE 0.75 % VA GEL: 1.0000 | Freq: Every day | VAGINAL | 0 refills | Status: AC

## 2017-07-05 NOTE — MAU Provider Note (Signed)
History     CSN: [id number]  Arrival date and time: 07/05/17 1441   First Provider Initiated Contact with Patient 07/05/17 1536      Chief Complaint  Patient presents with  . Vaginal Discharge   HPI  [name] is a 31 y.o. [id number] non-pregnant female presenting to MAU with complaints of watery vaginal, grayish-white  discharge and odor.  She has a h/o recurrent BV. She states that "every time she takes the medication the BV returns a few days after finishing the medication".  She has not been sexually active since May. She does not understand why the BV keeps coming back.  Past Medical History:  Diagnosis Date  . Anemia   . Anxiety    worse when on meds  . Vaginal Pap smear, abnormal    did bx, ok since    Past Surgical History:  Procedure Laterality Date  . CESAREAN SECTION      Family History  Problem Relation Age of Onset  . Alcohol abuse Neg Hx   . Arthritis Neg Hx   . Asthma Neg Hx   . Birth defects Neg Hx   . Cancer Neg Hx   . COPD Neg Hx   . Depression Neg Hx   . Diabetes Neg Hx   . Drug abuse Neg Hx   . Early death Neg Hx   . Hearing loss Neg Hx   . Heart disease Neg Hx   . Hyperlipidemia Neg Hx   . Hypertension Neg Hx   . Kidney disease Neg Hx   . Learning disabilities Neg Hx   . Mental illness Neg Hx   . Mental retardation Neg Hx   . Miscarriages / Stillbirths Neg Hx   . Stroke Neg Hx   . Vision loss Neg Hx     Social History  Substance Use Topics  . Smoking status: Never Smoker  . Smokeless tobacco: Never Used  . Alcohol use No    Allergies:  Allergies  Allergen Reactions  . Compazine [Prochlorperazine Edisylate] Anxiety  . Reglan [Metoclopramide] Anxiety    No prescriptions prior to admission.    Review of Systems  Constitutional: Negative.   HENT: Negative.   Eyes: Negative.   Respiratory: Negative.   Cardiovascular: Negative.   Gastrointestinal: Negative.   Endocrine: Negative.   Genitourinary: Positive for  vaginal discharge.       Vulvar itching  Musculoskeletal: Negative.   Skin: Negative.   Allergic/Immunologic: Negative.   Neurological: Negative.   Hematological: Negative.   Psychiatric/Behavioral: Negative.    Physical Exam   Blood pressure 109/70, pulse 94, temperature 98.4 F (36.9 C), temperature source Oral, resp. rate 16, weight 78.9 kg (174 lb), last menstrual period 06/25/2017, SpO2 100 %.  Physical Exam  Nursing note and vitals reviewed. Constitutional: She is oriented to person, place, and time. She appears well-developed and well-nourished.  HENT:  Head: Normocephalic.  Eyes: Pupils are equal, round, and reactive to light.  Neck: Normal range of motion.  Cardiovascular: Normal rate, regular rhythm and normal heart sounds.   Respiratory: Effort normal and breath sounds normal.  GI: Soft. Bowel sounds are normal.  Genitourinary:  Genitourinary Comments: Uterus: non-tender, cx; smooth, pink, no lesions, moderate amt of watery, white d/c, closed/long/firm, no CMT or friability, no adnexal tenderness  Neurological: She is alert and oriented to person, place, and time.  Skin: Skin is warm and dry.  Psychiatric: She has a normal mood and affect. Her behavior is  normal. Judgment and thought content normal.    MAU Course  Procedures  MDM [facility] UPT Wet Prep  Results for orders placed or performed during the hospital encounter of 07/05/17 (from the past 24 hour(s))  Urinalysis, Routine w reflex microscopic     Status: Abnormal   Collection Time: 07/05/17  2:52 PM  Result Value Ref Range   Color, Urine YELLOW YELLOW   APPearance HAZY (A) CLEAR   Specific Gravity, Urine 1.013 1.005 - 1.030   pH 5.0 5.0 - 8.0   Glucose, UA NEGATIVE NEGATIVE mg/dL   Hgb urine dipstick NEGATIVE NEGATIVE   Bilirubin Urine NEGATIVE NEGATIVE   Ketones, ur NEGATIVE NEGATIVE mg/dL   Protein, ur NEGATIVE NEGATIVE mg/dL   Nitrite NEGATIVE NEGATIVE   Leukocytes, UA NEGATIVE NEGATIVE   Pregnancy, urine POC     Status: None   Collection Time: 07/05/17  3:16 PM  Result Value Ref Range   Preg Test, Ur NEGATIVE NEGATIVE  Wet prep, genital     Status: Abnormal   Collection Time: 07/05/17  3:40 PM  Result Value Ref Range   Yeast Wet Prep HPF POC NONE SEEN NONE SEEN   Trich, Wet Prep NONE SEEN NONE SEEN   Clue Cells Wet Prep HPF POC PRESENT (A) NONE SEEN   WBC, Wet Prep HPF POC FEW (A) NONE SEEN   Sperm NONE SEEN     Assessment and Plan  Bacterial vaginitis - Rx Metrogel  - Instructions on BV given - Advised to take probiotic daily - Can soak in tub of water with baking soda x 20 mins twice daily after menstrual cycle and SI  Discharge home Patient verbalized an understanding of the plan of care and agrees.  Raelyn Mora, MSN, CNM 07/05/2017, 3:45 PM

## 2017-07-05 NOTE — MAU Note (Signed)
Been having a water vaginal d/c, grayish white.  Has tried the medicines they been given her, and a few days after they are finished, it returns.   Is not sexually active at all.  Doesn't understand why it keeps coming back.

## 2017-07-31 ENCOUNTER — Inpatient Hospital Stay (HOSPITAL_COMMUNITY)
Admission: AD | Admit: 2017-07-31 | Discharge: 2017-07-31 | Discharge status: Absent without leave | Payer: Medicaid Other | Source: Ambulatory Visit | Attending: Obstetrics & Gynecology | Admitting: Obstetrics & Gynecology

## 2017-08-21 ENCOUNTER — Ambulatory Visit (HOSPITAL_COMMUNITY)
Admission: EM | Admit: 2017-08-21 | Discharge: 2017-08-21 | Disposition: A | Discharge status: Home / Self Care | Payer: Self-pay | Attending: Family Medicine | Admitting: Family Medicine

## 2017-08-21 ENCOUNTER — Encounter (HOSPITAL_COMMUNITY): Payer: Self-pay

## 2017-08-21 DIAGNOSIS — J029 Acute pharyngitis, unspecified: Secondary | ICD-10-CM

## 2017-08-21 DIAGNOSIS — J028 Acute pharyngitis due to other specified organisms: Principal | ICD-10-CM

## 2017-08-21 DIAGNOSIS — B9789 Other viral agents as the cause of diseases classified elsewhere: Secondary | ICD-10-CM

## 2017-08-21 NOTE — ED Triage Notes (Signed)
[facility] last night 08/20/2017, pt complains of headache and sore throat.

## 2017-08-22 NOTE — ED Provider Notes (Signed)
  [facility]   [id number] 08/21/17 Arrival Time: 1836  ASSESSMENT & PLAN:  1. Sore throat (viral)    OTC symptom care as needed. May f/u as needed.  Reviewed expectations re: course of current medical issues. Questions answered. Outlined signs and symptoms indicating need for more acute intervention. Patient verbalized understanding. After Visit Summary given.   SUBJECTIVE:  Kellie Davila is a 31 y.o. female who presents with complaint a sore throat. Gradual onset for the past one day. No fever. Mild HA. No n/v. No abdominal symptoms or rash. Tolerating normal PO intake. No respiratory symptoms. OTC treatment: None.  ROS: As per HPI.   OBJECTIVE:  Vitals:   08/21/17 1941  BP: 112/70  Pulse: 73  Resp: 16  Temp: 98.4 F (36.9 C)  TempSrc: Oral  SpO2: 100%    General appearance: alert; no distress HEENT: nasal congestion; clear runny nose; throat irritation secondary to post-nasal drainage Neck: supple without LAD Lungs: clear to auscultation bilaterally Skin: warm and dry Psychological: alert and cooperative; normal mood and affect  Allergies  Allergen Reactions  . Compazine [Prochlorperazine Edisylate] Anxiety  . Reglan [Metoclopramide] Anxiety    Past Medical History:  Diagnosis Date  . Anemia   . Anxiety    worse when on meds  . Vaginal Pap smear, abnormal    did bx, ok since   Social History   Social History  . Marital status: Single    Spouse name: N/A  . Number of children: N/A  . Years of education: N/A   Occupational History  . Not on file.   Social History Main Topics  . Smoking status: Never Smoker  . Smokeless tobacco: Never Used  . Alcohol use No  . Drug use: No  . Sexual activity: Yes    Birth control/ protection: Abstinence   Other Topics Concern  . Not on file   Social History Narrative  . No narrative on file   Family History  Problem Relation Age of Onset  . Alcohol abuse Neg Hx   . Arthritis Neg Hx     . Asthma Neg Hx   . Birth defects Neg Hx   . Cancer Neg Hx   . COPD Neg Hx   . Depression Neg Hx   . Diabetes Neg Hx   . Drug abuse Neg Hx   . Early death Neg Hx   . Hearing loss Neg Hx   . Heart disease Neg Hx   . Hyperlipidemia Neg Hx   . Hypertension Neg Hx   . Kidney disease Neg Hx   . Learning disabilities Neg Hx   . Mental illness Neg Hx   . Mental retardation Neg Hx   . Miscarriages / Stillbirths Neg Hx   . Stroke Neg Hx   . Vision loss Neg Hx            Mardella LaymanHagler, Yajaira Doffing, MD 08/22/17 1029

## 2017-08-29 ENCOUNTER — Encounter (HOSPITAL_COMMUNITY): Payer: Self-pay | Admitting: *Deleted

## 2017-08-29 ENCOUNTER — Inpatient Hospital Stay (HOSPITAL_COMMUNITY)
Admission: AD | Admit: 2017-08-29 | Discharge: 2017-08-29 | Disposition: A | Discharge status: Home / Self Care | Payer: Medicaid Other | Source: Ambulatory Visit | Attending: Obstetrics and Gynecology | Admitting: Obstetrics and Gynecology

## 2017-08-29 DIAGNOSIS — B9689 Other specified bacterial agents as the cause of diseases classified elsewhere: Secondary | ICD-10-CM

## 2017-08-29 DIAGNOSIS — F419 Anxiety disorder, unspecified: Secondary | ICD-10-CM | POA: Diagnosis not present

## 2017-08-29 DIAGNOSIS — N760 Acute vaginitis: Secondary | ICD-10-CM | POA: Diagnosis not present

## 2017-08-29 DIAGNOSIS — N898 Other specified noninflammatory disorders of vagina: Secondary | ICD-10-CM | POA: Diagnosis present

## 2017-08-29 LAB — WET PREP, GENITAL
SPERM: NONE SEEN
Trich, Wet Prep: NONE SEEN
YEAST WET PREP: NONE SEEN

## 2017-08-29 LAB — URINALYSIS, ROUTINE W REFLEX MICROSCOPIC
BILIRUBIN URINE: NEGATIVE
Glucose, UA: NEGATIVE mg/dL
HGB URINE DIPSTICK: NEGATIVE
Ketones, ur: NEGATIVE mg/dL
Leukocytes, UA: NEGATIVE
Nitrite: NEGATIVE
PROTEIN: NEGATIVE mg/dL
SPECIFIC GRAVITY, URINE: 1.015 (ref 1.005–1.030)
pH: 5 (ref 5.0–8.0)

## 2017-08-29 MED ORDER — METRONIDAZOLE 0.75 % VA GEL: 1.0000 | Freq: Two times a day (BID) | VAGINAL | 0 refills | Status: DC

## 2017-08-29 NOTE — MAU Note (Signed)
Pt presents to MAU with complaints of grey, foul vaginal discharge for a week. Denies any pain

## 2017-08-29 NOTE — MAU Provider Note (Signed)
History     CSN: [id number]  Arrival date and time: 08/29/17 4098   First Provider Initiated Contact with Patient 08/29/17 [phone]      Chief Complaint  Patient presents with  . Vaginal Discharge   HPI Kellie Davila is a 31 y.o. [phone] non pregnant female who presents with vaginal discharge. She states she has a clear, grey vaginal discharge with a four odor for a week. She also reports vaginal irritation that started a week ago. She denies any pain or bleeding. Last intercourse in May. LMP 08/12/17. Patient reports a history of recurrent bacterial vaginosis.   OB History    Gravida Para Term Preterm AB Living   3 1 1   2 1    SAB TAB Ectopic Multiple Live Births   0 2 0 0 1      Past Medical History:  Diagnosis Date  . Anemia   . Anxiety    worse when on meds  . Vaginal Pap smear, abnormal    did bx, ok since    Past Surgical History:  Procedure Laterality Date  . CESAREAN SECTION      Family History  Problem Relation Age of Onset  . Alcohol abuse Neg Hx   . Arthritis Neg Hx   . Asthma Neg Hx   . Birth defects Neg Hx   . Cancer Neg Hx   . COPD Neg Hx   . Depression Neg Hx   . Diabetes Neg Hx   . Drug abuse Neg Hx   . Early death Neg Hx   . Hearing loss Neg Hx   . Heart disease Neg Hx   . Hyperlipidemia Neg Hx   . Hypertension Neg Hx   . Kidney disease Neg Hx   . Learning disabilities Neg Hx   . Mental illness Neg Hx   . Mental retardation Neg Hx   . Miscarriages / Stillbirths Neg Hx   . Stroke Neg Hx   . Vision loss Neg Hx     Social History  Substance Use Topics  . Smoking status: Never Smoker  . Smokeless tobacco: Never Used  . Alcohol use No    Allergies:  Allergies  Allergen Reactions  . Compazine [Prochlorperazine Edisylate] Anxiety  . Reglan [Metoclopramide] Anxiety    Prescriptions Prior to Admission  Medication Sig Dispense Refill Last Dose  . acetaminophen (TYLENOL) 500 MG tablet Take 500 mg by mouth every 6 (six) hours as  needed for moderate pain.   Unknown at Unknown time    Review of Systems  Constitutional: Negative.  Negative for fatigue and fever.  HENT: Negative.   Respiratory: Negative.  Negative for shortness of breath.   Cardiovascular: Negative.  Negative for chest pain.  Gastrointestinal: Negative.  Negative for abdominal pain, constipation, diarrhea, nausea and vomiting.  Genitourinary: Positive for vaginal discharge and vaginal pain. Negative for dysuria and vaginal bleeding.  Neurological: Negative.  Negative for dizziness and headaches.   Physical Exam   Blood pressure 114/63, pulse 74, temperature 98 F (36.7 C), resp. rate 16, weight 170 lb (77.1 kg), last menstrual period 08/12/2017.  Physical Exam  Nursing note and vitals reviewed. Constitutional: She is oriented to person, place, and time. She appears well-developed and well-nourished. No distress.  HENT:  Head: Normocephalic.  Eyes: Pupils are equal, round, and reactive to light.  Cardiovascular: Normal rate, regular rhythm and normal heart sounds.   Respiratory: Effort normal and breath sounds normal. No respiratory distress.  GI: Soft.  Bowel sounds are normal. She exhibits no distension. There is no tenderness.  Genitourinary: Vaginal discharge (moderate amount of grey, malodorous discharge) found.  Neurological: She is alert and oriented to person, place, and time.  Skin: Skin is warm and dry.  Psychiatric: She has a normal mood and affect. Her behavior is normal. Judgment and thought content normal.   Pelvic exam: Cervix pink, visually closed, without lesion, vaginal walls and external genitalia normal Bimanual exam: Cervix 0/long/high, firm, anterior, neg CMT, uterus nontender, nonenlarged, adnexa without tenderness, enlargement, or mass  MAU Course  Procedures Results for orders placed or performed during the hospital encounter of 08/29/17 (from the past 24 hour(s))  Urinalysis, Routine w reflex microscopic     Status:  Abnormal   Collection Time: 08/29/17  8:27 AM  Result Value Ref Range   Color, Urine YELLOW YELLOW   APPearance HAZY (A) CLEAR   Specific Gravity, Urine 1.015 1.005 - 1.030   pH 5.0 5.0 - 8.0   Glucose, UA NEGATIVE NEGATIVE mg/dL   Hgb urine dipstick NEGATIVE NEGATIVE   Bilirubin Urine NEGATIVE NEGATIVE   Ketones, ur NEGATIVE NEGATIVE mg/dL   Protein, ur NEGATIVE NEGATIVE mg/dL   Nitrite NEGATIVE NEGATIVE   Leukocytes, UA NEGATIVE NEGATIVE  Wet prep, genital     Status: Abnormal   Collection Time: 08/29/17  8:42 AM  Result Value Ref Range   Yeast Wet Prep HPF POC NONE SEEN NONE SEEN   Trich, Wet Prep NONE SEEN NONE SEEN   Clue Cells Wet Prep HPF POC PRESENT (A) NONE SEEN   WBC, Wet Prep HPF POC FEW (A) NONE SEEN   Sperm NONE SEEN      MDM UA, UPT Wet prep and gc/chlamydia  Assessment and Plan   1. Bacterial vaginosis   2. Vaginal discharge    -Discharge home in stable condition -Rx for metrogel per patient's preference -BV education reviewed, prevention options discussed -Patient advised to [facility] as needed for gyn care -Patient may return to MAU as needed or if her condition were to change or worsen  Rolm BookbinderCaroline M Neill CNM 08/29/2017, 9:16 AM   Allergies as of 08/29/2017      Reactions   Compazine [prochlorperazine Edisylate] Anxiety   Reglan [metoclopramide] Anxiety      Medication List    TAKE these medications   acetaminophen 500 MG tablet Commonly known as:  TYLENOL Take 500 mg by mouth every 6 (six) hours as needed for moderate pain.   metroNIDAZOLE 0.75 % vaginal gel Commonly known as:  METROGEL VAGINAL Place 1 Applicatorful vaginally 2 (two) times daily.

## 2017-08-29 NOTE — Discharge Instructions (Signed)

## 2017-08-30 LAB — GC/CHLAMYDIA PROBE AMP (~~LOC~~) NOT AT ARMC
Chlamydia: NEGATIVE
NEISSERIA GONORRHEA: NEGATIVE

## 2017-09-18 ENCOUNTER — Emergency Department (HOSPITAL_COMMUNITY): Payer: No Typology Code available for payment source

## 2017-09-18 ENCOUNTER — Encounter (HOSPITAL_COMMUNITY): Payer: Self-pay

## 2017-09-18 ENCOUNTER — Emergency Department (HOSPITAL_COMMUNITY)
Admission: EM | Admit: 2017-09-18 | Discharge: 2017-09-18 | Disposition: A | Discharge status: Home / Self Care | Payer: No Typology Code available for payment source | Attending: Emergency Medicine | Admitting: Emergency Medicine

## 2017-09-18 DIAGNOSIS — Y999 Unspecified external cause status: Secondary | ICD-10-CM | POA: Insufficient documentation

## 2017-09-18 DIAGNOSIS — Z79899 Other long term (current) drug therapy: Secondary | ICD-10-CM | POA: Insufficient documentation

## 2017-09-18 DIAGNOSIS — M542 Cervicalgia: Secondary | ICD-10-CM | POA: Insufficient documentation

## 2017-09-18 DIAGNOSIS — A084 Viral intestinal infection, unspecified: Secondary | ICD-10-CM

## 2017-09-18 DIAGNOSIS — Y9389 Activity, other specified: Secondary | ICD-10-CM | POA: Insufficient documentation

## 2017-09-18 DIAGNOSIS — S20211A Contusion of right front wall of thorax, initial encounter: Secondary | ICD-10-CM | POA: Insufficient documentation

## 2017-09-18 DIAGNOSIS — Y92410 Unspecified street and highway as the place of occurrence of the external cause: Secondary | ICD-10-CM | POA: Insufficient documentation

## 2017-09-18 DIAGNOSIS — S39012A Strain of muscle, fascia and tendon of lower back, initial encounter: Secondary | ICD-10-CM | POA: Insufficient documentation

## 2017-09-18 LAB — URINALYSIS, ROUTINE W REFLEX MICROSCOPIC
Bilirubin Urine: NEGATIVE
Glucose, UA: NEGATIVE mg/dL
Hgb urine dipstick: NEGATIVE
Ketones, ur: NEGATIVE mg/dL
Leukocytes, UA: NEGATIVE
NITRITE: NEGATIVE
PH: 6 (ref 5.0–8.0)
Protein, ur: NEGATIVE mg/dL
SPECIFIC GRAVITY, URINE: 1.013 (ref 1.005–1.030)

## 2017-09-18 LAB — I-STAT CHEM 8, ED
BUN: 14 mg/dL (ref 6–20)
CHLORIDE: 104 mmol/L (ref 101–111)
CREATININE: 0.9 mg/dL (ref 0.44–1.00)
Calcium, Ion: 1.27 mmol/L (ref 1.15–1.40)
Glucose, Bld: 106 mg/dL — ABNORMAL HIGH (ref 65–99)
HEMATOCRIT: 36 % (ref 36.0–46.0)
Hemoglobin: 12.2 g/dL (ref 12.0–15.0)
Potassium: 4 mmol/L (ref 3.5–5.1)
SODIUM: 141 mmol/L (ref 135–145)
TCO2: 27 mmol/L (ref 22–32)

## 2017-09-18 LAB — POC URINE PREG, ED: Preg Test, Ur: NEGATIVE

## 2017-09-18 MED ORDER — PROMETHAZINE HCL 25 MG PO TABS: 25.0000 mg | ORAL_TABLET | Freq: Four times a day (QID) | ORAL | 0 refills | Status: DC | PRN

## 2017-09-18 MED ORDER — ONDANSETRON 4 MG PO TBDP
4.0000 mg | ORAL_TABLET | Freq: Once | ORAL | Status: AC
  Administered 2017-09-18: 4 mg via ORAL
  Filled 2017-09-18: qty 1

## 2017-09-18 MED ORDER — CYCLOBENZAPRINE HCL 10 MG PO TABS: 10.0000 mg | ORAL_TABLET | Freq: Two times a day (BID) | ORAL | 0 refills | Status: DC | PRN
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
## 2017-09-18 NOTE — Discharge Instructions (Signed)
You may take tylenol and ibuprofen in addition to the medications we give you. The phenergan and the flexeril can make you sleepy.

## 2017-10-04 ENCOUNTER — Inpatient Hospital Stay (HOSPITAL_COMMUNITY)
Admission: AD | Admit: 2017-10-04 | Discharge: 2017-10-04 | Discharge status: Absent without leave | Payer: Medicaid Other | Source: Ambulatory Visit | Attending: Family Medicine | Admitting: Family Medicine

## 2017-10-15 ENCOUNTER — Inpatient Hospital Stay (HOSPITAL_COMMUNITY)
Admission: AD | Admit: 2017-10-15 | Discharge: 2017-10-15 | Discharge status: Absent without leave | Payer: Medicaid Other | Source: Ambulatory Visit | Attending: Family Medicine | Admitting: Family Medicine

## 2017-10-19 ENCOUNTER — Encounter (HOSPITAL_COMMUNITY): Payer: Self-pay

## 2017-10-19 ENCOUNTER — Inpatient Hospital Stay (HOSPITAL_COMMUNITY)
Admission: AD | Admit: 2017-10-19 | Discharge: 2017-10-19 | Disposition: A | Discharge status: Home / Self Care | Payer: Medicaid Other | Source: Ambulatory Visit | Attending: Obstetrics and Gynecology | Admitting: Obstetrics and Gynecology

## 2017-10-19 DIAGNOSIS — R109 Unspecified abdominal pain: Secondary | ICD-10-CM | POA: Diagnosis present

## 2017-10-19 DIAGNOSIS — N760 Acute vaginitis: Secondary | ICD-10-CM | POA: Diagnosis not present

## 2017-10-19 DIAGNOSIS — N898 Other specified noninflammatory disorders of vagina: Secondary | ICD-10-CM | POA: Diagnosis not present

## 2017-10-19 DIAGNOSIS — B9689 Other specified bacterial agents as the cause of diseases classified elsewhere: Secondary | ICD-10-CM | POA: Insufficient documentation

## 2017-10-19 DIAGNOSIS — Z79899 Other long term (current) drug therapy: Secondary | ICD-10-CM | POA: Diagnosis not present

## 2017-10-19 LAB — URINALYSIS, ROUTINE W REFLEX MICROSCOPIC
Bilirubin Urine: NEGATIVE
Glucose, UA: NEGATIVE mg/dL
Hgb urine dipstick: NEGATIVE
Ketones, ur: NEGATIVE mg/dL
LEUKOCYTES UA: NEGATIVE
NITRITE: NEGATIVE
PH: 5 (ref 5.0–8.0)
Protein, ur: NEGATIVE mg/dL
SPECIFIC GRAVITY, URINE: 1.015 (ref 1.005–1.030)

## 2017-10-19 LAB — WET PREP, GENITAL
SPERM: NONE SEEN
TRICH WET PREP: NONE SEEN
Yeast Wet Prep HPF POC: NONE SEEN

## 2017-10-19 LAB — POCT PREGNANCY, URINE: Preg Test, Ur: NEGATIVE

## 2017-10-19 MED ORDER — METRONIDAZOLE 0.75 % EX GEL: 1.0000 "application " | Freq: Two times a day (BID) | CUTANEOUS | 0 refills | Status: DC

## 2017-10-19 NOTE — Discharge Instructions (Signed)
Bacterial Vaginosis Bacterial vaginosis is an infection of the vagina. It happens when too many germs (bacteria) grow in the vagina. This infection puts you at risk for infections from sex (STIs). Treating this infection can lower your risk for some STIs. You should also treat this if you are pregnant. It can cause your baby to be born early. Follow these instructions at home: Medicines Take over-the-counter and prescription medicines only as told by your doctor. Take or use your antibiotic medicine as told by your doctor. Do not stop taking or using it even if you start to feel better. General instructions If you your sexual partner is a woman, tell her that you have this infection. She needs to get treatment if she has symptoms. If you have a female partner, he does not need to be treated. During treatment: Avoid sex. Do not douche. Avoid alcohol as told. Avoid breastfeeding as told. Drink enough fluid to keep your pee (urine) clear or pale yellow. Keep your vagina and butt (rectum) clean. Wash the area with warm water every day. Wipe from front to back after you use the toilet. Keep all follow-up visits as told by your doctor. This is important. Preventing this condition Do not douche. Use only warm water to wash around your vagina. Use protection when you have sex. This includes: Latex condoms. Dental dams. Limit how many people you have sex with. It is best to only have sex with the same person (be monogamous). Get tested for STIs. Have your partner get tested. Wear underwear that is cotton or lined with cotton. Avoid tight pants and pantyhose. This is most important in summer. Do not use any products that have nicotine or tobacco in them. These include cigarettes and e-cigarettes. If you need help quitting, ask your doctor. Do not use illegal drugs. Limit how much alcohol you drink. Contact a doctor if: Your symptoms do not get better, even after you are treated. You have more  discharge or pain when you pee (urinate). You have a fever. You have pain in your belly (abdomen). You have pain with sex. Your bleed from your vagina between periods. Summary This infection happens when too many germs (bacteria) grow in the vagina. Treating this condition can lower your risk for some infections from sex (STIs). You should also treat this if you are pregnant. It can cause early (premature) birth. Do not stop taking or using your antibiotic medicine even if you start to feel better. This information is not intended to replace advice given to you by your health care provider. Make sure you discuss any questions you have with your health care provider. Document Released: 07/24/2008 Document Revised: 06/30/2016 Document Reviewed: 06/30/2016 Elsevier Interactive Patient Education  2017 [employer]  Go buy some Boric acid suppositories to use weekly to keep BV away  Probiotic daily  Will treat this time then do the following treatments.    In late 2019, the [facility]. At that time, the MAU (Maternity Admissions Unit), where you are being seen today, will no longer take care of non-pregnant patients. We strongly encourage you to find a doctor's office before that time, so that you can be seen with any GYN concerns, like vaginal discharge, urinary tract infection, etc.. in a timely manner.  In order to make an office visit more convenient, the [facility] will be offering evening hours with same-day appointments, walk-in appointments and scheduled appointments  available during this time.  [facility] Hours: Monday - 8am - 7:30 pm with [facility] pm Tuesday - 8 am - 5 pm (starting 01/28/18 we will be open late and accepting walk-ins from 4pm - 7:30pm) Wednesday - 8 am - 5 pm (starting 04/30/18 we will be open late and accepting walk-ins from 4pm -  7:30pm) Thursday 8 am - 5 pm (starting 07/31/18 we will be open late and accepting walk-ins from 4pm - 7:30pm) Friday 8 am - 5 pm  For an appointment please call the [facility] at [phone]  For urgent needs, [facility] also available for management of urgent GYN complaints such as vaginal discharge or urinary tract infections.

## 2017-10-19 NOTE — MAU Note (Signed)
Patient presents with lower abdominal pain and discharge with an odor x 1 1/2 weeks.

## 2017-10-19 NOTE — MAU Provider Note (Signed)
Chief Complaint: Abdominal Pain and Vaginal Discharge   SUBJECTIVE HPI: Kellie Davila is a 31 y.o. [id number] who presents to MAU complaining of abdominal pain and vaginal discharge. Patient states symptoms have been present for a week. The abdominal pain is in her lower pelvis and is crampy. Comes and goes. Denies vaginal bleeding, n/v, fever. Denies she is pregnant. Last intercourse was 2 months ago and she used condom. Not currently on birth control. Having associated vaginal discharge that is whitish-gray in color with odor. Has a history of recurrent BV. States that the BV comes back monthly after her periods. Has not followed up with her provider.     Past Medical History:  Diagnosis Date  . Anemia   . Anxiety    worse when on meds  . Vaginal Pap smear, abnormal    did bx, ok since   OB History  Gravida Para Term Preterm AB Living  3 1 1   2 1   SAB TAB Ectopic Multiple Live Births  0 2 0 0 1    # Outcome Date GA Lbr Len/2nd Weight Sex Delivery Anes PTL Lv  3 Term 10/19/10 [id number]   M CS-LTranv EPI  LIV     Birth Comments: "stuck in the birth canal"  2 TAB           1 TAB              Past Surgical History:  Procedure Laterality Date  . CESAREAN SECTION     Social History   Socioeconomic History  . Marital status: Single    Spouse name: Not on file  . Number of children: Not on file  . Years of education: Not on file  . Highest education level: Not on file  Social Needs  . Financial resource strain: Not on file  . Food insecurity - worry: Not on file  . Food insecurity - inability: Not on file  . Transportation needs - medical: Not on file  . Transportation needs - non-medical: Not on file  Occupational History  . Not on file  Tobacco Use  . Smoking status: Never Smoker  . Smokeless tobacco: Never Used  Substance and Sexual Activity  . Alcohol use: No  . Drug use: No  . Sexual activity: Yes    Birth control/protection: Abstinence  Other Topics Concern  .  Not on file  Social History Narrative  . Not on file   No current facility-administered medications on file prior to encounter.    Current Outpatient Medications on File Prior to Encounter  Medication Sig Dispense Refill  . Multiple Vitamins-Minerals (EMERGEN-C IMMUNE PO) Take 1 packet by mouth daily as needed (To prevent sickness.).    Marland Kitchen. promethazine (PHENERGAN) 25 MG tablet Take 1 tablet (25 mg total) by mouth every 6 (six) hours as needed for nausea or vomiting. 30 tablet 0  . cyclobenzaprine (FLEXERIL) 10 MG tablet Take 1 tablet (10 mg total) by mouth 2 (two) times daily as needed for muscle spasms. (Patient not taking: Reported on 10/19/2017) 20 tablet 0  . metroNIDAZOLE (METROGEL VAGINAL) 0.75 % vaginal gel Place 1 Applicatorful vaginally 2 (two) times daily. (Patient not taking: Reported on 10/19/2017) 70 g 0   Allergies  Allergen Reactions  . Compazine [Prochlorperazine Edisylate] Anxiety  . Reglan [Metoclopramide] Anxiety    I have reviewed the past Medical Hx, Surgical Hx, Social Hx, Allergies and Medications.   REVIEW OF SYSTEMS All systems reviewed and are negative for acute  change except as noted in the HPI.   OBJECTIVE BP 99/61   Pulse 78   Temp 98 F (36.7 C)   Resp 16   Ht 5\' 2"  (1.575 m)   Wt 78 kg (172 lb)   LMP 10/05/2017   BMI 31.46 kg/m    PHYSICAL EXAM Constitutional: Well-developed, well-nourished female in no acute distress.  Cardiovascular: normal rate and rhythm, pulses intact Respiratory: normal rate and effort.  GI: Abd soft, non-tender, non-distended. MS: Extremities nontender, no edema, normal ROM Neurologic: Alert and oriented x 4. No focal deficits BIMANUAL: cervix closed; uterus normal size, no adnexal tenderness or masses. No CMT. Psych: normal mood and affect  LAB RESULTS Results for orders placed or performed during the hospital encounter of 10/19/17 (from the past 24 hour(s))  Urinalysis, Routine w reflex microscopic     Status:  Abnormal   Collection Time: 10/19/17  1:05 PM  Result Value Ref Range   Color, Urine YELLOW YELLOW   APPearance HAZY (A) CLEAR   Specific Gravity, Urine 1.015 1.005 - 1.030   pH 5.0 5.0 - 8.0   Glucose, UA NEGATIVE NEGATIVE mg/dL   Hgb urine dipstick NEGATIVE NEGATIVE   Bilirubin Urine NEGATIVE NEGATIVE   Ketones, ur NEGATIVE NEGATIVE mg/dL   Protein, ur NEGATIVE NEGATIVE mg/dL   Nitrite NEGATIVE NEGATIVE   Leukocytes, UA NEGATIVE NEGATIVE  Pregnancy, urine POC     Status: None   Collection Time: 10/19/17  1:16 PM  Result Value Ref Range   Preg Test, Ur NEGATIVE NEGATIVE  Wet prep, genital     Status: Abnormal   Collection Time: 10/19/17  1:28 PM  Result Value Ref Range   Yeast Wet Prep HPF POC NONE SEEN NONE SEEN   Trich, Wet Prep NONE SEEN NONE SEEN   Clue Cells Wet Prep HPF POC PRESENT (A) NONE SEEN   WBC, Wet Prep HPF POC FEW (A) NONE SEEN   Sperm NONE SEEN     IMAGING No results found.  MAU COURSE Vitals and nursing notes reviewed BV on wet prep UA and Upreg negative Treatments given in MAU: None  MDM Plan of care reviewed with patient, including labs and tests ordered and medical treatment.   ASSESSMENT 1. Vaginal discharge   2. Abdominal cramping   3. Bacterial vaginosis     PLAN Discharge home in stable condition Rx with Metrogel per patient preference Discussed alternative treatments to help with recurrent BV including boric acid and probiotics Counseled on return precautions Handout given   Caryl AdaJazma Lanitra Battaglini, DO OB Fellow Faculty Practice, [facility] [place] 10/19/2017, 1:45 PM

## 2017-10-21 LAB — GC/CHLAMYDIA PROBE AMP (~~LOC~~) NOT AT ARMC
CHLAMYDIA, DNA PROBE: NEGATIVE
NEISSERIA GONORRHEA: NEGATIVE

## 2017-11-11 ENCOUNTER — Other Ambulatory Visit (HOSPITAL_COMMUNITY)
Admission: RE | Admit: 2017-11-11 | Discharge: 2017-11-11 | Disposition: A | Discharge status: Home / Self Care | Payer: Medicaid Other | Source: Ambulatory Visit | Attending: Family Medicine | Admitting: Family Medicine

## 2017-11-11 ENCOUNTER — Ambulatory Visit (INDEPENDENT_AMBULATORY_CARE_PROVIDER_SITE_OTHER): Payer: Medicaid Other | Admitting: *Deleted

## 2017-11-11 DIAGNOSIS — N898 Other specified noninflammatory disorders of vagina: Secondary | ICD-10-CM | POA: Diagnosis present

## 2017-11-11 MED ORDER — FLUCONAZOLE 150 MG PO TABS: 150.0000 mg | ORAL_TABLET | Freq: Once | ORAL | 0 refills | Status: AC

## 2017-11-11 NOTE — Progress Notes (Signed)
I have reviewed this chart and agree with the RN/CMA assessment and management.    K. Meryl Cate Oravec, M.D. Attending Obstetrician & Gynecologist, [facility], [facility]  

## 2017-11-11 NOTE — Progress Notes (Signed)
C/o vaginal itching, no odor, thick white vaginal discharge. Self swab for wet prep done.

## 2017-11-12 LAB — CERVICOVAGINAL ANCILLARY ONLY
Bacterial vaginitis: NEGATIVE
CANDIDA VAGINITIS: POSITIVE — AB
Chlamydia: NEGATIVE
Neisseria Gonorrhea: NEGATIVE
TRICH (WINDOWPATH): NEGATIVE

## 2017-11-13 ENCOUNTER — Other Ambulatory Visit: Payer: Self-pay | Admitting: Obstetrics and Gynecology

## 2017-11-13 MED ORDER — FLUCONAZOLE 150 MG PO TABS: 150.0000 mg | ORAL_TABLET | Freq: Once | ORAL | 0 refills | Status: AC

## 2017-11-27 ENCOUNTER — Telehealth: Payer: Self-pay | Admitting: General Practice

## 2017-11-27 NOTE — Telephone Encounter (Signed)
Called patient and informed her of wet prep results & medication at pharmacy. Patient verbalized understanding & had no questions

## 2018-01-02 ENCOUNTER — Encounter (HOSPITAL_COMMUNITY): Payer: Self-pay | Admitting: *Deleted

## 2018-01-02 ENCOUNTER — Inpatient Hospital Stay (HOSPITAL_COMMUNITY)
Admission: AD | Admit: 2018-01-02 | Discharge: 2018-01-02 | Disposition: A | Discharge status: Home / Self Care | Payer: Medicaid Other | Source: Ambulatory Visit | Attending: Obstetrics & Gynecology | Admitting: Obstetrics & Gynecology

## 2018-01-02 DIAGNOSIS — F419 Anxiety disorder, unspecified: Secondary | ICD-10-CM | POA: Diagnosis not present

## 2018-01-02 DIAGNOSIS — R102 Pelvic and perineal pain: Secondary | ICD-10-CM | POA: Diagnosis not present

## 2018-01-02 DIAGNOSIS — Z202 Contact with and (suspected) exposure to infections with a predominantly sexual mode of transmission: Secondary | ICD-10-CM

## 2018-01-02 DIAGNOSIS — B9689 Other specified bacterial agents as the cause of diseases classified elsewhere: Secondary | ICD-10-CM | POA: Insufficient documentation

## 2018-01-02 DIAGNOSIS — N760 Acute vaginitis: Secondary | ICD-10-CM | POA: Insufficient documentation

## 2018-01-02 LAB — WET PREP, GENITAL
Sperm: NONE SEEN
TRICH WET PREP: NONE SEEN
YEAST WET PREP: NONE SEEN

## 2018-01-02 LAB — URINALYSIS, ROUTINE W REFLEX MICROSCOPIC
BILIRUBIN URINE: NEGATIVE
GLUCOSE, UA: NEGATIVE mg/dL
KETONES UR: NEGATIVE mg/dL
LEUKOCYTES UA: NEGATIVE
NITRITE: NEGATIVE
PH: 6 (ref 5.0–8.0)
Protein, ur: NEGATIVE mg/dL
Specific Gravity, Urine: 1.013 (ref 1.005–1.030)

## 2018-01-02 LAB — POCT PREGNANCY, URINE: Preg Test, Ur: NEGATIVE

## 2018-01-02 LAB — HEPATITIS B SURFACE ANTIGEN: Hepatitis B Surface Ag: NEGATIVE

## 2018-01-02 MED ORDER — METRONIDAZOLE 0.75 % VA GEL
1.0000 | Freq: Two times a day (BID) | VAGINAL | 0 refills | Status: DC
Start: 2018-01-02 — End: 2018-02-07

## 2018-01-02 MED ORDER — KETOROLAC TROMETHAMINE 60 MG/2ML IM SOLN: 60.0000 mg | Freq: Once | INTRAMUSCULAR | Status: DC

## 2018-01-02 NOTE — MAU Provider Note (Signed)
History     CSN: [id number]  Arrival date and time: 01/02/18 1756   First Provider Initiated Contact with Patient 01/02/18 1833     Chief Complaint  Patient presents with  . Pelvic Pain   HPI Kellie Davila is a 32 y.o. [phone] non pregnant female who presents with pelvic pain. She states she has a new partner and is requesting STD testing. She denies any vaginal bleeding or discharge. She does not have a gyn MD that she sees.   OB History    Gravida Para Term Preterm AB Living   3 1 1   2 1    SAB TAB Ectopic Multiple Live Births   0 2 0 0 1      Past Medical History:  Diagnosis Date  . Anemia   . Anxiety    worse when on meds  . Vaginal Pap smear, abnormal    did bx, ok since    Past Surgical History:  Procedure Laterality Date  . CESAREAN SECTION      Family History  Problem Relation Age of Onset  . Alcohol abuse Neg Hx   . Arthritis Neg Hx   . Asthma Neg Hx   . Birth defects Neg Hx   . Cancer Neg Hx   . COPD Neg Hx   . Depression Neg Hx   . Diabetes Neg Hx   . Drug abuse Neg Hx   . Early death Neg Hx   . Hearing loss Neg Hx   . Heart disease Neg Hx   . Hyperlipidemia Neg Hx   . Hypertension Neg Hx   . Kidney disease Neg Hx   . Learning disabilities Neg Hx   . Mental illness Neg Hx   . Mental retardation Neg Hx   . Miscarriages / Stillbirths Neg Hx   . Stroke Neg Hx   . Vision loss Neg Hx     Social History   Tobacco Use  . Smoking status: Never Smoker  . Smokeless tobacco: Never Used  Substance Use Topics  . Alcohol use: No  . Drug use: No    Allergies:  Allergies  Allergen Reactions  . Compazine [Prochlorperazine Edisylate] Anxiety  . Reglan [Metoclopramide] Anxiety    Medications Prior to Admission  Medication Sig Dispense Refill Last Dose  . cyclobenzaprine (FLEXERIL) 10 MG tablet Take 1 tablet (10 mg total) by mouth 2 (two) times daily as needed for muscle spasms. (Patient not taking: Reported on 10/19/2017) 20 tablet 0 Not  Taking at Unknown time  . metroNIDAZOLE (METROGEL) 0.75 % gel Apply 1 application topically 2 (two) times daily. 45 g 0   . Multiple Vitamins-Minerals (EMERGEN-C IMMUNE PO) Take 1 packet by mouth daily as needed (To prevent sickness.).   10/18/2017 at Unknown time  . promethazine (PHENERGAN) 25 MG tablet Take 1 tablet (25 mg total) by mouth every 6 (six) hours as needed for nausea or vomiting. 30 tablet 0 Past Week at Unknown time    Review of Systems  Constitutional: Negative.  Negative for fatigue and fever.  HENT: Negative.   Respiratory: Negative.  Negative for shortness of breath.   Cardiovascular: Negative.  Negative for chest pain.  Gastrointestinal: Negative.  Negative for abdominal pain, constipation, diarrhea, nausea and vomiting.  Genitourinary: Negative.  Negative for dysuria.       Pelvic pain  Neurological: Negative.  Negative for dizziness and headaches.   Physical Exam   Blood pressure 120/69, pulse 84, temperature 98.8 F (37.1  C), resp. rate 18, height 5\' 2"  (1.575 m), weight 176 lb (79.8 kg), last menstrual period 12/26/2017.  Physical Exam  Nursing note and vitals reviewed. Constitutional: She is oriented to person, place, and time. She appears well-developed and well-nourished. No distress.  HENT:  Head: Normocephalic.  Eyes: Pupils are equal, round, and reactive to light.  Cardiovascular: Normal rate, regular rhythm and normal heart sounds.  Respiratory: Effort normal and breath sounds normal. No respiratory distress.  GI: Soft. Bowel sounds are normal. She exhibits no distension. There is no tenderness.  Neurological: She is alert and oriented to person, place, and time.  Skin: Skin is warm and dry.  Psychiatric: She has a normal mood and affect. Her behavior is normal. Judgment and thought content normal.    MAU Course  Procedures Results for orders placed or performed during the hospital encounter of 01/02/18 (from the past 24 hour(s))  Urinalysis,  Routine w reflex microscopic     Status: Abnormal   Collection Time: 01/02/18  6:05 PM  Result Value Ref Range   Color, Urine YELLOW YELLOW   APPearance HAZY (A) CLEAR   Specific Gravity, Urine 1.013 1.005 - 1.030   pH 6.0 5.0 - 8.0   Glucose, UA NEGATIVE NEGATIVE mg/dL   Hgb urine dipstick MODERATE (A) NEGATIVE   Bilirubin Urine NEGATIVE NEGATIVE   Ketones, ur NEGATIVE NEGATIVE mg/dL   Protein, ur NEGATIVE NEGATIVE mg/dL   Nitrite NEGATIVE NEGATIVE   Leukocytes, UA NEGATIVE NEGATIVE   RBC / HPF 0-5 0 - 5 RBC/hpf   WBC, UA 0-5 0 - 5 WBC/hpf   Bacteria, UA MANY (A) NONE SEEN   Squamous Epithelial / LPF 6-30 (A) NONE SEEN  Pregnancy, urine POC     Status: None   Collection Time: 01/02/18  6:09 PM  Result Value Ref Range   Preg Test, Ur NEGATIVE NEGATIVE  Wet prep, genital     Status: Abnormal   Collection Time: 01/02/18  6:59 PM  Result Value Ref Range   Yeast Wet Prep HPF POC NONE SEEN NONE SEEN   Trich, Wet Prep NONE SEEN NONE SEEN   Clue Cells Wet Prep HPF POC PRESENT (A) NONE SEEN   WBC, Wet Prep HPF POC FEW (A) NONE SEEN   Sperm NONE SEEN    MDM UA, UPT Wet prep and gc/chlamydia HIV, RPR, Hep B  Assessment and Plan   1. Pelvic pain   2. Possible exposure to STD   3. Bacterial vaginosis    -Discharge home in stable condition -Rx for metrogel sent to patient's pharmacy per request -Safe sexual precautions discussed -Patient advised to follow-up with [facility] or gyn of choice for routine gyn needs -Patient may return to MAU as needed or if her condition were to change or worsen  Rolm Bookbinder CNM 01/02/2018, 6:36 PM

## 2018-01-02 NOTE — MAU Note (Signed)
Pt reports she has been having pelvic pain and pressure for 2 days. C/o pressure with urination . Also reports a whitish gray discharge thinks it might be BV she has had it in the past.

## 2018-01-02 NOTE — Discharge Instructions (Signed)
In late 2019, [facility] be [facility] to the [facility] [place] campus. At that time, the MAU (Maternity Admissions Unit), where you are being seen today, will no longer take care of non-pregnant patients. We strongly encourage you to find a doctor's office before that time, so that you can be seen with any GYN concerns, like vaginal discharge, urinary tract infection, etc.. in a timely manner. ° °In order to make an office visit more convenient, the [facility] will be offering evening hours with same-day appointments, walk-in appointments and scheduled appointments available during this time. ° °[facility] Hours: °Monday - 8am - 7:30 pm with walk-in between 4pm- 7:30 pm °Tuesday - 8 am - 5 pm (starting 01/28/18 we will be open late and accepting walk-ins from 4pm - 7:30pm) °Wednesday - 8 am - 5 pm (starting 04/30/18 we will be open late and accepting walk-ins from 4pm - 7:30pm) °Thursday 8 am - 5 pm (starting 07/31/18 we will be open late and accepting walk-ins from 4pm - 7:30pm) °Friday 8 am - 5 pm ° °For an appointment please call the [facility] at [phone] ° °For urgent needs, [facility] is also available for management of urgent GYN complaints such as vaginal discharge or urinary tract infections. ° ° ° ° ° ° °Bacterial Vaginosis °Bacterial vaginosis is a vaginal infection that occurs when the normal balance of bacteria in the vagina is disrupted. It results from an overgrowth of certain bacteria. This is the most common vaginal infection among women ages 15-44. °Because bacterial vaginosis increases your risk for STIs (sexually transmitted infections), getting treated can help reduce your risk for chlamydia, gonorrhea, herpes, and HIV (human immunodeficiency virus). Treatment is also important for preventing complications in pregnant women, because this condition can cause an early  (premature) delivery. °What are the causes? °This condition is caused by an increase in harmful bacteria that are normally present in small amounts in the vagina. However, the reason that the condition develops is not fully understood. °What increases the risk? °The following factors may make you more likely to develop this condition: °· Having a new sexual partner or multiple sexual partners. °· Having unprotected sex. °· Douching. °· Having an intrauterine device (IUD). °· Smoking. °· Drug and alcohol abuse. °· Taking certain antibiotic medicines. °· Being pregnant. ° °You cannot get bacterial vaginosis from toilet seats, bedding, swimming pools, or contact with objects around you. °What are the signs or symptoms? °Symptoms of this condition include: °· Grey or white vaginal discharge. The discharge can also be watery or foamy. °· A fish-like odor with discharge, especially after sexual intercourse or during menstruation. °· Itching in and around the vagina. °· Burning or pain with urination. ° °Some women with bacterial vaginosis have no signs or symptoms. °How is this diagnosed? °This condition is diagnosed based on: °· Your medical history. °· A physical exam of the vagina. °· Testing a sample of vaginal fluid under a microscope to look for a large amount of bad bacteria or abnormal cells. Your health care provider may use a cotton swab or a small wooden spatula to collect the sample. ° °How is this treated? °This condition is treated with antibiotics. These may be given as a pill, a vaginal cream, or a medicine that is put into the vagina (suppository). If the condition comes back after treatment, a second round of antibiotics may   be needed. °Follow these instructions at home: °Medicines °· Take over-the-counter and prescription medicines only as told by your health care provider. °· Take or use your antibiotic as told by your health care provider. Do not stop taking or using the antibiotic even if you start  to feel better. °General instructions °· If you have a female sexual partner, tell her that you have a vaginal infection. She should see her health care provider and be treated if she has symptoms. If you have a female sexual partner, he does not need treatment. °· During treatment: °? Avoid sexual activity until you finish treatment. °? Do not douche. °? Avoid alcohol as directed by your health care provider. °? Avoid breastfeeding as directed by your health care provider. °· Drink enough water and fluids to keep your urine clear or pale yellow. °· Keep the area around your vagina and rectum clean. °? Wash the area daily with warm water. °? Wipe yourself from front to back after using the toilet. °· Keep all follow-up visits as told by your health care provider. This is important. °How is this prevented? °· Do not douche. °· Wash the outside of your vagina with warm water only. °· Use protection when having sex. This includes latex condoms and dental dams. °· Limit how many sexual partners you have. To help prevent bacterial vaginosis, it is best to have sex with just one partner (monogamous). °· Make sure you and your sexual partner are tested for STIs. °· Wear cotton or cotton-lined underwear. °· Avoid wearing tight pants and pantyhose, especially during summer. °· Limit the amount of alcohol that you drink. °· Do not use any products that contain nicotine or tobacco, such as cigarettes and e-cigarettes. If you need help quitting, ask your health care provider. °· Do not use illegal drugs. °Where to find more information: °· Centers for Disease Control and Prevention: www.cdc.gov/std °· American Sexual Health Association (ASHA): www.ashastd.org °· U.S. Department of Health and Human Services, Office on Women's Health: www.womenshealth.gov/ or https://www.womenshealth.gov/a-z-topics/bacterial-vaginosis °Contact a health care provider if: °· Your symptoms do not improve, even after treatment. °· You have more  discharge or pain when urinating. °· You have a fever. °· You have pain in your abdomen. °· You have pain during sex. °· You have vaginal bleeding between periods. °Summary °· Bacterial vaginosis is a vaginal infection that occurs when the normal balance of bacteria in the vagina is disrupted. °· Because bacterial vaginosis increases your risk for STIs (sexually transmitted infections), getting treated can help reduce your risk for chlamydia, gonorrhea, herpes, and HIV (human immunodeficiency virus). Treatment is also important for preventing complications in pregnant women, because the condition can cause an early (premature) delivery. °· This condition is treated with antibiotic medicines. These may be given as a pill, a vaginal cream, or a medicine that is put into the vagina (suppository). °This information is not intended to replace advice given to you by your health care provider. Make sure you discuss any questions you have with your health care provider. °Document Released: 10/15/2005 Document Revised: 02/18/2017 Document Reviewed: 06/30/2016 °Elsevier Interactive Patient Education © 2018 [employer] ° °

## 2018-01-03 LAB — HIV ANTIBODY (ROUTINE TESTING W REFLEX): HIV SCREEN 4TH GENERATION: NONREACTIVE

## 2018-01-03 LAB — RPR: RPR Ser Ql: NONREACTIVE

## 2018-01-04 LAB — GC/CHLAMYDIA PROBE AMP (~~LOC~~) NOT AT ARMC
Chlamydia: NEGATIVE
Neisseria Gonorrhea: NEGATIVE

## 2018-01-28 ENCOUNTER — Encounter (HOSPITAL_COMMUNITY): Payer: Self-pay | Admitting: Emergency Medicine

## 2018-01-28 ENCOUNTER — Other Ambulatory Visit: Payer: Self-pay

## 2018-01-28 ENCOUNTER — Ambulatory Visit (HOSPITAL_COMMUNITY)
Admission: EM | Admit: 2018-01-28 | Discharge: 2018-01-28 | Disposition: A | Discharge status: Home / Self Care | Payer: Medicaid Other | Attending: Family Medicine | Admitting: Family Medicine

## 2018-01-28 DIAGNOSIS — R112 Nausea with vomiting, unspecified: Secondary | ICD-10-CM

## 2018-01-28 DIAGNOSIS — R197 Diarrhea, unspecified: Secondary | ICD-10-CM

## 2018-01-28 MED ORDER — ONDANSETRON 4 MG PO TBDP
4.0000 mg | ORAL_TABLET | Freq: Once | ORAL | Status: AC
  Administered 2018-01-28: 4 mg via ORAL

## 2018-01-28 MED ORDER — ONDANSETRON 4 MG PO TBDP
ORAL_TABLET | ORAL | Status: AC
  Filled 2018-01-28: qty 1

## 2018-01-28 MED ORDER — PROMETHAZINE HCL 12.5 MG PO TABS: 12.5000 mg | ORAL_TABLET | Freq: Three times a day (TID) | ORAL | 0 refills | Status: DC | PRN

## 2018-01-28 MED ORDER — ONDANSETRON HCL 4 MG/2ML IJ SOLN: 4.0000 mg | Freq: Once | INTRAMUSCULAR | Status: DC

## 2018-01-28 NOTE — ED Triage Notes (Signed)
Pt reports N, V, & D since last night.

## 2018-01-28 NOTE — Discharge Instructions (Signed)
Phenergan for nausea and vomiting as needed. Keep hydrated, you urine should be clear to pale yellow in color. Bland diet as attached, advance as tolerated. Monitor for any worsening of symptoms, nausea or vomiting not controlled by medication, worsening abdominal pain, fever, unwilling to jump up and down due to pain, go to the emergency department for further evaluation.

## 2018-01-28 NOTE — ED Provider Notes (Signed)
[facility]    CSN: [id number] Arrival date & time: 01/28/18  1501     History   Chief Complaint Chief Complaint  Patient presents with  . Emesis    HPI Kellie Davila is a 32 y.o. female.   32 year old female comes in for 1 day history of nausea, vomiting, diarrhea.  She has had 5 episodes of nonbilious nonbloody vomit.  3 episodes of diarrhea that is loose and watery.  Denies blood in stool.  Said some abdominal pain associated with vomiting, states resolves after vomiting.  Denies fever, chills, night sweats.  Has had some sinus pressure/nasal congestion.  Denies cough, sore throat.  Last episode of vomiting prior to arrival, has not been able to keep anything down.  Denies recent antibiotic use.      Past Medical History:  Diagnosis Date  . Anemia   . Anxiety    worse when on meds  . Vaginal Pap smear, abnormal    did bx, ok since    Patient Active Problem List   Diagnosis Date Noted  . Bacterial vaginitis 07/05/2017  . Abdominal pain affecting pregnancy, antepartum 01/25/2016  . Previous cesarean delivery affecting pregnancy, antepartum 01/25/2016  . 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 Abnormal Pap smear of cervix 12/27/2014    Past Surgical History:  Procedure Laterality Date  . CESAREAN SECTION      OB History    Gravida  3   Para  1   Term  1   Preterm      AB  2   Living  1     SAB  0   TAB  2   Ectopic  0   Multiple  0   Live Births  1            Home Medications    Prior to Admission medications   Medication Sig Start Date End Date Taking? Authorizing Provider  metroNIDAZOLE (METROGEL VAGINAL) 0.75 % vaginal gel Place 1 Applicatorful vaginally 2 (two) times daily. 01/02/18   Rolm Bookbinder, CNM  Multiple Vitamins-Minerals (EMERGEN-C IMMUNE PO) Take 1 packet by mouth daily as needed (To prevent sickness.).    [provider]  promethazine (PHENERGAN) 12.5 MG tablet Take 1 tablet (12.5 mg total) by mouth every  8 (eight) hours as needed for nausea or vomiting. 01/28/18   Belinda Fisher, PA-C    Family History Family History  Problem Relation Age of Onset  . Alcohol abuse Neg Hx   . Arthritis Neg Hx   . Asthma Neg Hx   . Birth defects Neg Hx   . Cancer Neg Hx   . COPD Neg Hx   . Depression Neg Hx   . Diabetes Neg Hx   . Drug abuse Neg Hx   . Early death Neg Hx   . Hearing loss Neg Hx   . Heart disease Neg Hx   . Hyperlipidemia Neg Hx   . Hypertension Neg Hx   . Kidney disease Neg Hx   . Learning disabilities Neg Hx   . Mental illness Neg Hx   . Mental retardation Neg Hx   . Miscarriages / Stillbirths Neg Hx   . Stroke Neg Hx   . Vision loss Neg Hx     Social History Social History   Tobacco Use  . Smoking status: Never Smoker  . Smokeless tobacco: Never Used  Substance Use Topics  . Alcohol use: No  . Drug use: No  Allergies   Compazine [prochlorperazine edisylate] and Reglan [metoclopramide]   Review of Systems Review of Systems  Reason unable to perform ROS: See HPI as above.     Physical Exam Triage Vital Signs ED Triage Vitals  Enc Vitals Group     BP 01/28/18 1535 113/80     Pulse Rate 01/28/18 1535 82     Resp --      Temp 01/28/18 1535 (!) 97.4 F (36.3 C)     Temp Source 01/28/18 1535 Oral     SpO2 01/28/18 1535 96 %     Weight --      Height --      Head Circumference --      Peak Flow --      Pain Score 01/28/18 1533 0     Pain Loc --      Pain Edu? --      Excl. in GC? --    No data found.  Updated Vital Signs BP 113/80 (BP Location: Left Wrist)   Pulse 82   Temp (!) 97.4 F (36.3 C) (Oral)   LMP 01/19/2018 (Exact Date)   SpO2 96%   Physical Exam  Constitutional: She is oriented to person, place, and time. She appears well-developed and well-nourished. No distress.  HENT:  Head: Normocephalic and atraumatic.  Right Ear: Tympanic membrane, external ear and ear canal normal. Tympanic membrane is not erythematous and not bulging.  Left  Ear: Tympanic membrane, external ear and ear canal normal. Tympanic membrane is not erythematous and not bulging.  Nose: Rhinorrhea present. Right sinus exhibits no maxillary sinus tenderness and no frontal sinus tenderness. Left sinus exhibits no maxillary sinus tenderness and no frontal sinus tenderness.  Mouth/Throat: Uvula is midline, oropharynx is clear and moist and mucous membranes are normal.  Eyes: Pupils are equal, round, and reactive to light. Conjunctivae are normal.  Neck: Normal range of motion. Neck supple.  Cardiovascular: Normal rate, regular rhythm and normal heart sounds. Exam reveals no gallop and no friction rub.  No murmur heard. Pulmonary/Chest: Effort normal and breath sounds normal. She has no decreased breath sounds. She has no wheezes. She has no rhonchi. She has no rales.  Abdominal: Soft. Bowel sounds are normal. She exhibits no mass. There is no tenderness. There is no rebound, no guarding and no CVA tenderness.  Lymphadenopathy:    She has no cervical adenopathy.  Neurological: She is alert and oriented to person, place, and time.  Skin: Skin is warm and dry.  Psychiatric: She has a normal mood and affect. Her behavior is normal. Judgment normal.     UC Treatments / Results  Labs (all labs ordered are listed, but only abnormal results are displayed) Labs Reviewed - No data to display  EKG None Radiology No results found.  Procedures Procedures (including critical care time)  Medications Ordered in UC Medications  ondansetron (ZOFRAN) injection 4 mg (has no administration in time range)     Initial Impression / Assessment and Plan / UC Course  I have reviewed the triage vital signs and the nursing notes.  Pertinent labs & imaging results that were available during my care of the patient were reviewed by me and considered in my medical decision making (see chart for details).    Discussed with patient no alarming signs on exam. Patient requesting  phenergan, stating it works better than zofran. Phenergan for nausea. Push fluids. Bland diet, advance as tolerated. Return precautions given.   Final Clinical Impressions(s) /  UC Diagnoses   Final diagnoses:  Nausea vomiting and diarrhea    ED Discharge Orders        Ordered    promethazine (PHENERGAN) 12.5 MG tablet  Every 8 hours PRN     01/28/18 1557        Belinda FisherYu, Elih Mooney V, PA-C 01/28/18 1604

## 2018-01-29 ENCOUNTER — Telehealth (HOSPITAL_COMMUNITY): Payer: Self-pay | Admitting: Emergency Medicine

## 2018-01-29 NOTE — Telephone Encounter (Signed)
Pt called requesting larger dose of nausea meds; pt advised to take 2 pills at one time per Linward HeadlandAmy Davila

## 2018-02-07 ENCOUNTER — Inpatient Hospital Stay (HOSPITAL_COMMUNITY)
Admission: AD | Admit: 2018-02-07 | Discharge: 2018-02-07 | Disposition: A | Discharge status: Home / Self Care | Payer: Medicaid Other | Source: Ambulatory Visit | Attending: Obstetrics and Gynecology | Admitting: Obstetrics and Gynecology

## 2018-02-07 ENCOUNTER — Encounter (HOSPITAL_COMMUNITY): Payer: Self-pay

## 2018-02-07 DIAGNOSIS — B9689 Other specified bacterial agents as the cause of diseases classified elsewhere: Secondary | ICD-10-CM

## 2018-02-07 DIAGNOSIS — R300 Dysuria: Secondary | ICD-10-CM

## 2018-02-07 DIAGNOSIS — N760 Acute vaginitis: Secondary | ICD-10-CM | POA: Diagnosis not present

## 2018-02-07 LAB — URINALYSIS, ROUTINE W REFLEX MICROSCOPIC
BILIRUBIN URINE: NEGATIVE
Glucose, UA: NEGATIVE mg/dL
KETONES UR: NEGATIVE mg/dL
LEUKOCYTES UA: NEGATIVE
Nitrite: NEGATIVE
PROTEIN: NEGATIVE mg/dL
Specific Gravity, Urine: 1.014 (ref 1.005–1.030)
pH: 5 (ref 5.0–8.0)

## 2018-02-07 LAB — WET PREP, GENITAL
Sperm: NONE SEEN
TRICH WET PREP: NONE SEEN
YEAST WET PREP: NONE SEEN

## 2018-02-07 LAB — POCT PREGNANCY, URINE: Preg Test, Ur: NEGATIVE

## 2018-02-07 MED ORDER — FOSFOMYCIN TROMETHAMINE 3 G PO PACK: 3.0000 g | PACK | Freq: Once | ORAL | 0 refills | Status: AC

## 2018-02-07 MED ORDER — METRONIDAZOLE 0.75 % VA GEL: 1.0000 | Freq: Two times a day (BID) | VAGINAL | 0 refills | Status: DC

## 2018-02-07 NOTE — MAU Provider Note (Signed)
History     CSN: [id number]  Arrival date and time: 02/07/18 0823  32 y.o. Non-pregnant female here with dysuria and vaginal discharge. She describes the pain as sharp in her right flank when she urinates. Rates 5/10. Hasn't tried anything. No burning, hematuria, or urgency. Frequency is present. Sx ongoing and intermittent x1 week. Describes the vaginal discharge as malodorous, thin, and grey x4 days. No new partner. Recent STD screen was negative. Treated last month for BV.   Past Medical History:  Diagnosis Date  . Anemia   . Anxiety    worse when on meds  . Vaginal Pap smear, abnormal    did bx, ok since    Past Surgical History:  Procedure Laterality Date  . CESAREAN SECTION      Family History  Problem Relation Age of Onset  . Alcohol abuse Neg Hx   . Arthritis Neg Hx   . Asthma Neg Hx   . Birth defects Neg Hx   . Cancer Neg Hx   . COPD Neg Hx   . Depression Neg Hx   . Diabetes Neg Hx   . Drug abuse Neg Hx   . Early death Neg Hx   . Hearing loss Neg Hx   . Heart disease Neg Hx   . Hyperlipidemia Neg Hx   . Hypertension Neg Hx   . Kidney disease Neg Hx   . Learning disabilities Neg Hx   . Mental illness Neg Hx   . Mental retardation Neg Hx   . Miscarriages / Stillbirths Neg Hx   . Stroke Neg Hx   . Vision loss Neg Hx     Social History   Tobacco Use  . Smoking status: Never Smoker  . Smokeless tobacco: Never Used  Substance Use Topics  . Alcohol use: No  . Drug use: No    Allergies:  Allergies  Allergen Reactions  . Compazine [Prochlorperazine Edisylate] Anxiety  . Reglan [Metoclopramide] Anxiety    No medications prior to admission.    Review of Systems  Constitutional: Negative for chills and fever.  Gastrointestinal: Positive for abdominal pain and nausea. Negative for vomiting.  Genitourinary: Positive for dysuria, frequency and vaginal discharge. Negative for hematuria and urgency.   Physical Exam   Blood pressure 107/72, pulse 85,  temperature 97.9 F (36.6 C), resp. rate 16, height 5\' 2"  (1.575 m), weight 178 lb (80.7 kg), last menstrual period 01/19/2018.  Physical Exam  Constitutional: She is oriented to person, place, and time. She appears well-developed and well-nourished. No distress.  HENT:  Head: Normocephalic and atraumatic.  Neck: Normal range of motion.  Cardiovascular: Normal rate.  Respiratory: Effort normal. No respiratory distress.  GI: She exhibits no distension and no mass. There is no tenderness. There is no rebound, no guarding and no CVA tenderness.  Genitourinary:  Genitourinary Comments: External: no lesions or erythema Vagina: rugated, pink, moist, small amt thin white discharge Uterus: non enlarged, anteverted, non tender, no CMT Adnexae: no masses, no tenderness left, no tenderness right    Musculoskeletal: Normal range of motion.  Neurological: She is alert and oriented to person, place, and time.  Skin: Skin is warm and dry.  Psychiatric: She has a normal mood and affect.   Results for orders placed or performed during the hospital encounter of 02/07/18 (from the past 24 hour(s))  Urinalysis, Routine w reflex microscopic     Status: Abnormal   Collection Time: 02/07/18  8:30 AM  Result Value Ref Range  Color, Urine YELLOW YELLOW   APPearance CLEAR CLEAR   Specific Gravity, Urine 1.014 1.005 - 1.030   pH 5.0 5.0 - 8.0   Glucose, UA NEGATIVE NEGATIVE mg/dL   Hgb urine dipstick SMALL (A) NEGATIVE   Bilirubin Urine NEGATIVE NEGATIVE   Ketones, ur NEGATIVE NEGATIVE mg/dL   Protein, ur NEGATIVE NEGATIVE mg/dL   Nitrite NEGATIVE NEGATIVE   Leukocytes, UA NEGATIVE NEGATIVE   RBC / HPF 0-5 0 - 5 RBC/hpf   WBC, UA 0-5 0 - 5 WBC/hpf   Bacteria, UA MANY (A) NONE SEEN   Squamous Epithelial / LPF 6-30 (A) NONE SEEN   Mucus PRESENT   Pregnancy, urine POC     Status: None   Collection Time: 02/07/18  8:49 AM  Result Value Ref Range   Preg Test, Ur NEGATIVE NEGATIVE  Wet prep, genital      Status: Abnormal   Collection Time: 02/07/18  8:50 AM  Result Value Ref Range   Yeast Wet Prep HPF POC NONE SEEN NONE SEEN   Trich, Wet Prep NONE SEEN NONE SEEN   Clue Cells Wet Prep HPF POC PRESENT (A) NONE SEEN   WBC, Wet Prep HPF POC FEW (A) NONE SEEN   Sperm NONE SEEN    MAU Course  Procedures  MDM Labs ordered and reviewed. Will treat UTI (UC sent and Fosfomycin per pt request). Will treat BV and discussed measures for prevention since this is recurrent for her. Stable for discharge home.    Assessment and Plan   1. Bacterial vaginosis   2. Dysuria    Discharge home Follow up with PCP as needed Establish care with GYN-list provided Return to MAU for emergencies  Allergies as of 02/07/2018      Reactions   Compazine [prochlorperazine Edisylate] Anxiety   Reglan [metoclopramide] Anxiety      Medication List    TAKE these medications   EMERGEN-C IMMUNE PO Take 1 packet by mouth daily as needed (To prevent sickness.).   fosfomycin 3 g Pack Commonly known as:  MONUROL Take 3 g by mouth once for 1 dose.   metroNIDAZOLE 0.75 % vaginal gel Commonly known as:  METROGEL VAGINAL Place 1 Applicatorful vaginally 2 (two) times daily.   promethazine 12.5 MG tablet Commonly known as:  PHENERGAN Take 1 tablet (12.5 mg total) by mouth every 8 (eight) hours as needed for nausea or vomiting.      Kellie Davila, CNM 02/07/2018, 10:16 AM

## 2018-02-07 NOTE — Discharge Instructions (Signed)
Bacterial Vaginosis Bacterial vaginosis is an infection of the vagina. It happens when too many germs (bacteria) grow in the vagina. This infection puts you at risk for infections from sex (STIs). Treating this infection can lower your risk for some STIs. You should also treat this if you are pregnant. It can cause your baby to be born early. Follow these instructions at home: Medicines  Take over-the-counter and prescription medicines only as told by your doctor.  Take or use your antibiotic medicine as told by your doctor. Do not stop taking or using it even if you start to feel better. General instructions  If you your sexual partner is a woman, tell her that you have this infection. She needs to get treatment if she has symptoms. If you have a female partner, he does not need to be treated.  During treatment: ? Avoid sex. ? Do not douche. ? Avoid alcohol as told. ? Avoid breastfeeding as told.  Drink enough fluid to keep your pee (urine) clear or pale yellow.  Keep your vagina and butt (rectum) clean. ? Wash the area with warm water every day. ? Wipe from front to back after you use the toilet.  Keep all follow-up visits as told by your doctor. This is important. Preventing this condition  Do not douche.  Use only warm water to wash around your vagina.  Use protection when you have sex. This includes: ? Latex condoms. ? Dental dams.  Limit how many people you have sex with. It is best to only have sex with the same person (be monogamous).  Get tested for STIs. Have your partner get tested.  Wear underwear that is cotton or lined with cotton.  Avoid tight pants and pantyhose. This is most important in summer.  Do not use any products that have nicotine or tobacco in them. These include cigarettes and e-cigarettes. If you need help quitting, ask your doctor.  Do not use illegal drugs.  Limit how much alcohol you drink. Contact a doctor if:  Your symptoms do not  get better, even after you are treated.  You have more discharge or pain when you pee (urinate).  You have a fever.  You have pain in your belly (abdomen).  You have pain with sex.  Your bleed from your vagina between periods. Summary  This infection happens when too many germs (bacteria) grow in the vagina.  Treating this condition can lower your risk for some infections from sex (STIs).  You should also treat this if you are pregnant. It can cause early (premature) birth.  Do not stop taking or using your antibiotic medicine even if you start to feel better. This information is not intended to replace advice given to you by your health care provider. Make sure you discuss any questions you have with your health care provider. Document Released: 07/24/2008 Document Revised: 06/30/2016 Document Reviewed: 06/30/2016 Elsevier Interactive Patient Education  2017 [employer] Urinary Frequency, Adult Urinary frequency means urinating more often than usual. People with urinary frequency urinate at least 8 times in 24 hours, even if they drink a normal amount of fluid. Although they urinate more often than normal, the total amount of urine produced in a day may be normal. Urinary frequency is also called pollakiuria. What are the causes? This condition may be caused by:  A urinary tract infection.  Obesity.  Bladder problems, such as bladder stones.  Caffeine or alcohol.  Eating food or drinking fluids that irritate the  bladder. These include coffee, tea, soda, artificial sweeteners, citrus, tomato-based foods, and chocolate.  Certain medicines, such as medicines that help the body get rid of extra fluid (diuretics).  Muscle or nerve weakness.  Overactive bladder.  Chronic diabetes.  Interstitial cystitis.  In men, problems with the prostate, such as an enlarged prostate.  In women, pregnancy.  In some cases, the cause may not be known. What increases the risk? This  condition is more likely to develop in:  Women who have gone through menopause.  Men with prostate problems.  People with a disease or injury that affects the nerves or spinal cord.  People who have or have had a condition that affects the brain, such as a stroke.  What are the signs or symptoms? Symptoms of this condition include:  Feeling an urgent need to urinate often. The stress and anxiety of needing to find a bathroom quickly can make this urge worse.  Urinating 8 or more times in 24 hours.  Urinating as often as every 1 to 2 hours.  How is this diagnosed? This condition is diagnosed based on your symptoms, your medical history, and a physical exam. You may have tests, such as:  Blood tests.  Urine tests.  Imaging tests, such as X-rays or ultrasounds.  A bladder test.  A test of your neurological system. This is the body system that senses the need to urinate.  A test to check for problems in the urethra and bladder called cystoscopy.  You may also be asked to keep a bladder diary. A bladder diary is a record of what you eat and drink, how often you urinate, and how much you urinate. You may need to see a health care provider who specializes in conditions of the urinary tract (urologist) or kidneys (nephrologist). How is this treated? Treatment for this condition depends on the cause. Sometimes the condition goes away on its own and treatment is not necessary. If treatment is needed, it may include:  Taking medicine.  Learning exercises that strengthen the muscles that help control urination.  Following a bladder training program. This may include: ? Learning to delay going to the bathroom. ? Double urinating (voiding). This helps if you are not completely emptying your bladder. ? Scheduled voiding.  Making diet changes, such as: ? Avoiding caffeine. ? Drinking fewer fluids, especially alcohol. ? Not drinking in the evening. ? Not having foods or drinks that  may irritate the bladder. ? Eating foods that help prevent or ease constipation. Constipation can make this condition worse.  Having the nerves in your bladder stimulated. There are two options for stimulating the nerves to your bladder: ? Outpatient electrical nerve stimulation. This is done by your health care provider. ? Surgery to implant a bladder pacemaker. The pacemaker helps to control the urge to urinate.  Follow these instructions at home:  Keep a bladder diary if told to by your health care provider.  Take over-the-counter and prescription medicines only as told by your health care provider.  Do any exercises as told by your health care provider.  Follow a bladder training program as told by your health care provider.  Make any recommended diet changes.  Keep all follow-up visits as told by your health care provider. This is important. Contact a health care provider if:  You start urinating more often.  You feel pain or irritation when you urinate.  You notice blood in your urine.  Your urine looks cloudy.  You develop a  fever.  You begin vomiting. Get help right away if:  You are unable to urinate. This information is not intended to replace advice given to you by your health care provider. Make sure you discuss any questions you have with your health care provider. Document Released: 08/11/2009 Document Revised: 11/16/2015 Document Reviewed: 05/11/2015 Elsevier Interactive Patient Education  [employer].

## 2018-02-07 NOTE — MAU Note (Signed)
Patient presents with right flank pain and dysuria x 1 weeks, white vaginal discharge with an odor x 1 week.

## 2018-02-09 ENCOUNTER — Telehealth: Payer: Self-pay | Admitting: Student

## 2018-02-09 DIAGNOSIS — N3000 Acute cystitis without hematuria: Secondary | ICD-10-CM

## 2018-02-09 LAB — URINE CULTURE

## 2018-02-09 MED ORDER — NITROFURANTOIN MONOHYD MACRO 100 MG PO CAPS: 100.0000 mg | ORAL_CAPSULE | Freq: Two times a day (BID) | ORAL | 0 refills | Status: DC

## 2018-02-09 NOTE — Telephone Encounter (Signed)
Verified by name & DOB. Notified of + urine culture. Allergies & pharmacy verified. Rx sent in for macrobid. Complete abx as prescribed. If symptoms worsen or fever develops go to ED. Pt verbalized understanding.   Judeth HornLawrence, Kaidyn Hernandes, NP

## 2018-02-10 LAB — GC/CHLAMYDIA PROBE AMP (~~LOC~~) NOT AT ARMC
CHLAMYDIA, DNA PROBE: NEGATIVE
NEISSERIA GONORRHEA: NEGATIVE

## 2018-02-14 ENCOUNTER — Encounter (HOSPITAL_COMMUNITY): Payer: Self-pay | Admitting: Emergency Medicine

## 2018-02-14 ENCOUNTER — Emergency Department (HOSPITAL_COMMUNITY)
Admission: EM | Admit: 2018-02-14 | Discharge: 2018-02-14 | Disposition: A | Discharge status: Home / Self Care | Payer: Medicaid Other | Attending: Emergency Medicine | Admitting: Emergency Medicine

## 2018-02-14 DIAGNOSIS — R112 Nausea with vomiting, unspecified: Secondary | ICD-10-CM | POA: Diagnosis present

## 2018-02-14 DIAGNOSIS — R197 Diarrhea, unspecified: Secondary | ICD-10-CM | POA: Insufficient documentation

## 2018-02-14 LAB — CBC
HCT: 35 % — ABNORMAL LOW (ref 36.0–46.0)
Hemoglobin: 12 g/dL (ref 12.0–15.0)
MCH: 29.2 pg (ref 26.0–34.0)
MCHC: 34.3 g/dL (ref 30.0–36.0)
MCV: 85.2 fL (ref 78.0–100.0)
PLATELETS: 376 10*3/uL (ref 150–400)
RBC: 4.11 MIL/uL (ref 3.87–5.11)
RDW: 13.8 % (ref 11.5–15.5)
WBC: 5 10*3/uL (ref 4.0–10.5)

## 2018-02-14 LAB — URINALYSIS, ROUTINE W REFLEX MICROSCOPIC
Bilirubin Urine: NEGATIVE
GLUCOSE, UA: NEGATIVE mg/dL
Ketones, ur: NEGATIVE mg/dL
Leukocytes, UA: NEGATIVE
NITRITE: NEGATIVE
PH: 5 (ref 5.0–8.0)
Protein, ur: NEGATIVE mg/dL
SPECIFIC GRAVITY, URINE: 1.013 (ref 1.005–1.030)

## 2018-02-14 LAB — COMPREHENSIVE METABOLIC PANEL
ALBUMIN: 4 g/dL (ref 3.5–5.0)
ALK PHOS: 38 U/L (ref 38–126)
ALT: 22 U/L (ref 14–54)
AST: 28 U/L (ref 15–41)
Anion gap: 8 (ref 5–15)
BILIRUBIN TOTAL: 0.4 mg/dL (ref 0.3–1.2)
BUN: 13 mg/dL (ref 6–20)
CO2: 23 mmol/L (ref 22–32)
CREATININE: 0.85 mg/dL (ref 0.44–1.00)
Calcium: 9.2 mg/dL (ref 8.9–10.3)
Chloride: 107 mmol/L (ref 101–111)
GFR calc Af Amer: >60 mL/min (ref >60)
GFR calc non Af Amer: >60 mL/min (ref >60)
GLUCOSE: 122 mg/dL — AB (ref 65–99)
Potassium: 3.3 mmol/L — ABNORMAL LOW (ref 3.5–5.1)
SODIUM: 138 mmol/L (ref 135–145)
TOTAL PROTEIN: 7.7 g/dL (ref 6.5–8.1)

## 2018-02-14 LAB — I-STAT BETA HCG BLOOD, ED (MC, WL, AP ONLY)

## 2018-02-14 LAB — LIPASE, BLOOD: Lipase: 25 U/L (ref 11–51)

## 2018-02-14 MED ORDER — PROMETHAZINE HCL 12.5 MG PO TABS: 12.5000 mg | ORAL_TABLET | Freq: Four times a day (QID) | ORAL | 0 refills | Status: DC | PRN

## 2018-02-14 NOTE — ED Provider Notes (Signed)
[facility]-EMERGENCY DEPT Provider Note   CSN: [id number] Arrival date & time: 02/14/18  1217     History   Chief Complaint Chief Complaint  Patient presents with  . Emesis  . Diarrhea    HPI Kellie Davila is a 32 y.o. female.  HPI Pt presents with vomiting and diarrhea.  Sx started last night with an upset stomach.  Today she started with vomiting and diarrhea.  She vomited 5 times and has had two loose stools.  She has not taken any thing for her symptoms.  No blood in stool or emesis.  No fever.  No recent abx.  No travel.  She has been exposed to her child who had a similar illness. Past Medical History:  Diagnosis Date  . Anemia   . Anxiety    worse when on meds  . Vaginal Pap smear, abnormal    did bx, ok since    Patient Active Problem List   Diagnosis Date Noted  . Bacterial vaginitis 07/05/2017  . Abdominal pain affecting pregnancy, antepartum 01/25/2016  . Previous cesarean delivery affecting pregnancy, antepartum 01/25/2016  . 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 Abnormal Pap smear of cervix 12/27/2014    Past Surgical History:  Procedure Laterality Date  . CESAREAN SECTION       OB History    Gravida  3   Para  1   Term  1   Preterm      AB  2   Living  1     SAB  0   TAB  2   Ectopic  0   Multiple  0   Live Births  1            Home Medications    Prior to Admission medications   Medication Sig Start Date End Date Taking? Authorizing Provider  Multiple Vitamins-Minerals (EMERGEN-C IMMUNE PO) Take 1 packet by mouth daily as needed (To prevent sickness.).   Yes [provider]  nitrofurantoin, macrocrystal-monohydrate, (MACROBID) 100 MG capsule Take 1 capsule (100 mg total) by mouth 2 (two) times daily. 02/09/18  Yes Judeth Horn, NP  metroNIDAZOLE (METROGEL VAGINAL) 0.75 % vaginal gel Place 1 Applicatorful vaginally 2 (two) times daily. Patient not taking: Reported on 02/14/2018 02/07/18   Donette Larry, CNM  promethazine (PHENERGAN) 12.5 MG tablet Take 1-2 tablets (12.5-25 mg total) by mouth every 6 (six) hours as needed for nausea or vomiting. 02/14/18   Linwood Dibbles, MD    Family History Family History  Problem Relation Age of Onset  . Alcohol abuse Neg Hx   . Arthritis Neg Hx   . Asthma Neg Hx   . Birth defects Neg Hx   . Cancer Neg Hx   . COPD Neg Hx   . Depression Neg Hx   . Diabetes Neg Hx   . Drug abuse Neg Hx   . Early death Neg Hx   . Hearing loss Neg Hx   . Heart disease Neg Hx   . Hyperlipidemia Neg Hx   . Hypertension Neg Hx   . Kidney disease Neg Hx   . Learning disabilities Neg Hx   . Mental illness Neg Hx   . Mental retardation Neg Hx   . Miscarriages / Stillbirths Neg Hx   . Stroke Neg Hx   . Vision loss Neg Hx     Social History Social History   Tobacco Use  . Smoking status: Never Smoker  . Smokeless tobacco:  Never Used  Substance Use Topics  . Alcohol use: No  . Drug use: No     Allergies   Compazine [prochlorperazine edisylate] and Reglan [metoclopramide]   Review of Systems Review of Systems  Respiratory: Negative for chest tightness.   Cardiovascular: Negative for chest pain.  Gastrointestinal: Negative for abdominal pain.  Genitourinary: Negative for dysuria.  All other systems reviewed and are negative.    Physical Exam Updated Vital Signs BP 105/72 (BP Location: Right Arm)   Pulse 82   Temp 98.2 F (36.8 C) (Oral)   Resp 16   LMP 01/19/2018 (Exact Date)   SpO2 99%   Physical Exam  Constitutional: No distress.  HENT:  Head: Normocephalic and atraumatic.  Right Ear: External ear normal.  Left Ear: External ear normal.  Eyes: Conjunctivae are normal. Right eye exhibits no discharge. Left eye exhibits no discharge. No scleral icterus.  Neck: Neck supple. No tracheal deviation present.  Cardiovascular: Normal rate, regular rhythm and intact distal pulses.  Pulmonary/Chest: Effort normal and breath sounds normal. No  stridor. No respiratory distress. She has no wheezes. She has no rales.  Abdominal: Soft. Bowel sounds are normal. She exhibits no distension. There is no tenderness. There is no rebound and no guarding.  Musculoskeletal: She exhibits no edema or tenderness.  Neurological: She is alert. She has normal strength. No cranial nerve deficit (no facial droop, extraocular movements intact, no slurred speech) or sensory deficit. She exhibits normal muscle tone. She displays no seizure activity. Coordination normal.  Skin: Skin is warm and dry. No rash noted. She is not diaphoretic.  Psychiatric: She has a normal mood and affect.  Nursing note and vitals reviewed.    ED Treatments / Results  Labs (all labs ordered are listed, but only abnormal results are displayed) Labs Reviewed  COMPREHENSIVE METABOLIC PANEL - Abnormal; Notable for the following components:      Result Value   Potassium 3.3 (*)    Glucose, Bld 122 (*)    All other components within normal limits  CBC - Abnormal; Notable for the following components:   HCT 35.0 (*)    All other components within normal limits  URINALYSIS, ROUTINE W REFLEX MICROSCOPIC - Abnormal; Notable for the following components:   Hgb urine dipstick SMALL (*)    Bacteria, UA MANY (*)    Squamous Epithelial / LPF 0-5 (*)    All other components within normal limits  LIPASE, BLOOD  I-STAT BETA HCG BLOOD, ED ([facility], [facility], [facility] ONLY)     Procedures Procedures (including critical care time)  Medications Ordered in ED Medications - No data to display   Initial Impression / Assessment and Plan / ED Course  I have reviewed the triage vital signs and the nursing notes.  Pertinent labs & imaging results that were available during my care of the patient were reviewed by me and considered in my medical decision making (see chart for details).   Patient presented to the emergency room for evaluation of vomiting and diarrhea.  Patient has had a few episodes today.   Patient's exam is reassuring.  Her abdomen is benign.  Her laboratory tests are reassuring.  No signs of any severe dehydration.  Patient states Phenergan has worked better for her in the past.  She does not want any right now because she needs to drive home and it makes her drowsy.  Final Clinical Impressions(s) / ED Diagnoses   Final diagnoses:  Nausea vomiting and diarrhea  ED Discharge Orders        Ordered    promethazine (PHENERGAN) 12.5 MG tablet  Every 6 hours PRN     02/14/18 1727       Linwood DibblesKnapp, Kharter Sestak, MD 02/14/18 1731

## 2018-02-14 NOTE — ED Notes (Signed)
Nursing student discharged patient. No vital signs were put in. This Clinical research associatewriter was made aware after patient had left already.

## 2018-02-14 NOTE — ED Triage Notes (Signed)
Pt reports that she believes she had stomach bug again bc woke up with n/v/d.

## 2018-02-14 NOTE — Discharge Instructions (Addendum)
Take the Phenergan as needed for nausea, if the diarrhea becomes more pronounced you can take over-the-counter Imodium, follow-up with a primary care doctor if your symptoms have not not resolved in the next 48 hours

## 2018-03-28 ENCOUNTER — Encounter (HOSPITAL_COMMUNITY): Payer: Self-pay

## 2018-03-28 ENCOUNTER — Ambulatory Visit (HOSPITAL_COMMUNITY)
Admission: EM | Admit: 2018-03-28 | Discharge: 2018-03-28 | Disposition: A | Discharge status: Home / Self Care | Payer: Medicaid Other | Attending: Family Medicine | Admitting: Family Medicine

## 2018-03-28 DIAGNOSIS — G43009 Migraine without aura, not intractable, without status migrainosus: Secondary | ICD-10-CM

## 2018-03-28 MED ORDER — PROMETHAZINE HCL 12.5 MG PO TABS: 12.5000 mg | ORAL_TABLET | Freq: Four times a day (QID) | ORAL | 0 refills | Status: DC | PRN

## 2018-03-28 MED ORDER — NAPROXEN 500 MG PO TABS: 500.0000 mg | ORAL_TABLET | Freq: Two times a day (BID) | ORAL | 0 refills | Status: DC

## 2018-03-28 NOTE — Discharge Instructions (Signed)
Use anti-inflammatories for headache. I have sent in Naprosyn to try OR You may take up to 800 mg Ibuprofen every 8 hours with food. You may supplement Ibuprofen with Tylenol [phone] mg every 8 hours.   Phenergan as needed for nausea  Please return if symptoms not improving, worsening or developing weakness, changes in vision, shortness of breath, difficulty speaking

## 2018-03-28 NOTE — ED Provider Notes (Signed)
[facility]    CSN: [id number] Arrival date & time: 03/28/18  1205     History   Chief Complaint Chief Complaint  Patient presents with  . Headache  . Nausea    HPI Kellie Davila is a 32 y.o. female no contributing past medical history presenting today for evaluation of a headache and nausea.  Symptoms have been going on for approximately 2 days.  Has also had decreased appetite.  Denies photophobia.  One episode of vomiting.  Feels pressure-like sensation over frontal region as well as occipital region.  Has been taking ibuprofen 600 mg without relief.  Last menstrual period was 5/6, no form of birth control.  States that she typically gets similar headaches like this.  HPI  Past Medical History:  Diagnosis Date  . Anemia   . Anxiety    worse when on meds  . Vaginal Pap smear, abnormal    did bx, ok since    Patient Active Problem List   Diagnosis Date Noted  . Bacterial vaginitis 07/05/2017  . Abdominal pain affecting pregnancy, antepartum 01/25/2016  . Previous cesarean delivery affecting pregnancy, antepartum 01/25/2016  . 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 Abnormal Pap smear of cervix 12/27/2014    Past Surgical History:  Procedure Laterality Date  . CESAREAN SECTION      OB History    Gravida  3   Para  1   Term  1   Preterm      AB  2   Living  1     SAB  0   TAB  2   Ectopic  0   Multiple  0   Live Births  1            Home Medications    Prior to Admission medications   Medication Sig Start Date End Date Taking? Authorizing Provider  Multiple Vitamins-Minerals (EMERGEN-C IMMUNE PO) Take 1 packet by mouth daily as needed (To prevent sickness.).    [provider]  naproxen (NAPROSYN) 500 MG tablet Take 1 tablet (500 mg total) by mouth 2 (two) times daily. 03/28/18   Wieters, Hallie C, PA-C  nitrofurantoin, macrocrystal-monohydrate, (MACROBID) 100 MG capsule Take 1 capsule (100 mg total) by mouth 2 (two) times  daily. 02/09/18   Judeth Horn, NP  promethazine (PHENERGAN) 12.5 MG tablet Take 1-2 tablets (12.5-25 mg total) by mouth every 6 (six) hours as needed for nausea or vomiting. 03/28/18   Wieters, Junius Creamer, PA-C    Family History Family History  Problem Relation Age of Onset  . Alcohol abuse Neg Hx   . Arthritis Neg Hx   . Asthma Neg Hx   . Birth defects Neg Hx   . Cancer Neg Hx   . COPD Neg Hx   . Depression Neg Hx   . Diabetes Neg Hx   . Drug abuse Neg Hx   . Early death Neg Hx   . Hearing loss Neg Hx   . Heart disease Neg Hx   . Hyperlipidemia Neg Hx   . Hypertension Neg Hx   . Kidney disease Neg Hx   . Learning disabilities Neg Hx   . Mental illness Neg Hx   . Mental retardation Neg Hx   . Miscarriages / Stillbirths Neg Hx   . Stroke Neg Hx   . Vision loss Neg Hx     Social History Social History   Tobacco Use  . Smoking status: Never Smoker  . Smokeless  tobacco: Never Used  Substance Use Topics  . Alcohol use: No  . Drug use: No     Allergies   Compazine [prochlorperazine edisylate] and Reglan [metoclopramide]   Review of Systems Review of Systems  Constitutional: Positive for appetite change. Negative for activity change, fatigue and fever.  Eyes: Negative for photophobia and visual disturbance.  Respiratory: Negative for shortness of breath.   Cardiovascular: Negative for chest pain.  Gastrointestinal: Positive for nausea and vomiting. Negative for abdominal pain and diarrhea.  Genitourinary: Negative for menstrual problem.  Musculoskeletal: Negative for neck pain and neck stiffness.  Skin: Negative for rash.  Neurological: Positive for headaches. Negative for dizziness, syncope, weakness, light-headedness and numbness.     Physical Exam Triage Vital Signs ED Triage Vitals [03/28/18 1234]  Enc Vitals Group     BP 104/68     Pulse Rate 66     Resp 18     Temp 97.8 F (36.6 C)     Temp src      SpO2      Weight      Height      Head  Circumference      Peak Flow      Pain Score 6     Pain Loc      Pain Edu?      Excl. in GC?    No data found.  Updated Vital Signs BP 104/68   Pulse 66   Temp 97.8 F (36.6 C)   Resp 18   LMP 03/03/2018   Visual Acuity Right Eye Distance:   Left Eye Distance:   Bilateral Distance:    Right Eye Near:   Left Eye Near:    Bilateral Near:     Physical Exam  Constitutional: She appears well-developed and well-nourished. No distress.  HENT:  Head: Normocephalic and atraumatic.  Eyes: Pupils are equal, round, and reactive to light. Conjunctivae and EOM are normal.  Neck: Neck supple.  Cardiovascular: Normal rate and regular rhythm.  No murmur heard. Pulmonary/Chest: Effort normal and breath sounds normal. No respiratory distress.  Abdominal: Soft. There is no tenderness.  Musculoskeletal: She exhibits no edema.  Neurological: She is alert.  Patient A&O x3, cranial nerves II-XII grossly intact, strength at shoulders, hips and knees 5/5, equal bilaterally. Gait without abnormality. GCS 15   Skin: Skin is warm and dry.  Psychiatric: She has a normal mood and affect.  Nursing note and vitals reviewed.    UC Treatments / Results  Labs (all labs ordered are listed, but only abnormal results are displayed) Labs Reviewed - No data to display  EKG None  Radiology No results found.  Procedures Procedures (including critical care time)  Medications Ordered in UC Medications - No data to display  Initial Impression / Assessment and Plan / UC Course  I have reviewed the triage vital signs and the nursing notes.  Pertinent labs & imaging results that were available during my care of the patient were reviewed by me and considered in my medical decision making (see chart for details).     Patient with migraine versus tension type headache.  Offered injections of Toradol, Decadron and Reglan for symptoms.  Patient declined.  States that she has had significant  improvement with Phenergan previously.  Will provide Naprosyn as an alternative to ibuprofen and Tylenol as well as Phenergan.Discussed strict return precautions. Patient verbalized understanding and is agreeable with plan.  Final Clinical Impressions(s) / UC Diagnoses   Final diagnoses:  Migraine without aura and without status migrainosus, not intractable     Discharge Instructions     Use anti-inflammatories for headache. I have sent in Naprosyn to try OR You may take up to 800 mg Ibuprofen every 8 hours with food. You may supplement Ibuprofen with Tylenol [phone] mg every 8 hours.   Phenergan as needed for nausea  Please return if symptoms not improving, worsening or developing weakness, changes in vision, shortness of breath, difficulty speaking   ED Prescriptions    Medication Sig Dispense Auth. Provider   promethazine (PHENERGAN) 12.5 MG tablet Take 1-2 tablets (12.5-25 mg total) by mouth every 6 (six) hours as needed for nausea or vomiting. 20 tablet Wieters, Hallie C, PA-C   naproxen (NAPROSYN) 500 MG tablet Take 1 tablet (500 mg total) by mouth 2 (two) times daily. 30 tablet Wieters, [place] C, PA-C     Controlled Substance Prescriptions [place] Controlled Substance Registry consulted? Not Applicable   Lew Dawes, [place] 03/28/18 2145

## 2018-03-28 NOTE — ED Triage Notes (Signed)
Pt presents with complaints of headache and nausea x 2 days.

## 2018-06-19 ENCOUNTER — Ambulatory Visit (HOSPITAL_COMMUNITY)
Admission: EM | Admit: 2018-06-19 | Discharge: 2018-06-19 | Disposition: A | Discharge status: Home / Self Care | Payer: Medicaid Other | Attending: Family Medicine | Admitting: Family Medicine

## 2018-06-19 ENCOUNTER — Encounter (HOSPITAL_COMMUNITY): Payer: Self-pay | Admitting: Emergency Medicine

## 2018-06-19 DIAGNOSIS — M79672 Pain in left foot: Secondary | ICD-10-CM | POA: Diagnosis not present

## 2018-06-19 DIAGNOSIS — T148XXA Other injury of unspecified body region, initial encounter: Secondary | ICD-10-CM

## 2018-06-19 DIAGNOSIS — S90852A Superficial foreign body, left foot, initial encounter: Secondary | ICD-10-CM

## 2018-06-19 NOTE — ED Triage Notes (Signed)
Pt states she stepped on something last night and it went into the bottom of her L foot. Unsure what it is, small black dot noticed to be under skin of L foot.

## 2018-06-19 NOTE — ED Provider Notes (Signed)
[facility]    CSN: [id number] Arrival date & time: 06/19/18  1248     History   Chief Complaint Chief Complaint  Patient presents with  . Foreign Body in Skin    HPI Kellie Davila is a 32 y.o. female.   Kellie Davila presents with complaints of sliver of mulch to the bottom of her left foot. Occurred last night. She was walking in flip flops and felt it go into bottom of foot, has been tender since. No drainage, no redness. Without contributing medical history.      ROS per HPI.      Past Medical History:  Diagnosis Date  . Anemia   . Anxiety    worse when on meds  . Vaginal Pap smear, abnormal    did bx, ok since    Patient Active Problem List   Diagnosis Date Noted  . Bacterial vaginitis 07/05/2017  . Abdominal pain affecting pregnancy, antepartum 01/25/2016  . Previous cesarean delivery affecting pregnancy, antepartum 01/25/2016  . 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 Abnormal Pap smear of cervix 12/27/2014    Past Surgical History:  Procedure Laterality Date  . CESAREAN SECTION      OB History    Gravida  3   Para  1   Term  1   Preterm      AB  2   Living  1     SAB  0   TAB  2   Ectopic  0   Multiple  0   Live Births  1            Home Medications    Prior to Admission medications   Medication Sig Start Date End Date Taking? Authorizing Provider  Multiple Vitamins-Minerals (EMERGEN-C IMMUNE PO) Take 1 packet by mouth daily as needed (To prevent sickness.).    [provider]  naproxen (NAPROSYN) 500 MG tablet Take 1 tablet (500 mg total) by mouth 2 (two) times daily. 03/28/18   Wieters, Hallie C, PA-C  nitrofurantoin, macrocrystal-monohydrate, (MACROBID) 100 MG capsule Take 1 capsule (100 mg total) by mouth 2 (two) times daily. 02/09/18   Judeth Horn, NP  promethazine (PHENERGAN) 12.5 MG tablet Take 1-2 tablets (12.5-25 mg total) by mouth every 6 (six) hours as needed for nausea or vomiting. 03/28/18   Wieters,  Junius Creamer, PA-C    Family History Family History  Problem Relation Age of Onset  . Alcohol abuse Neg Hx   . Arthritis Neg Hx   . Asthma Neg Hx   . Birth defects Neg Hx   . Cancer Neg Hx   . COPD Neg Hx   . Depression Neg Hx   . Diabetes Neg Hx   . Drug abuse Neg Hx   . Early death Neg Hx   . Hearing loss Neg Hx   . Heart disease Neg Hx   . Hyperlipidemia Neg Hx   . Hypertension Neg Hx   . Kidney disease Neg Hx   . Learning disabilities Neg Hx   . Mental illness Neg Hx   . Mental retardation Neg Hx   . Miscarriages / Stillbirths Neg Hx   . Stroke Neg Hx   . Vision loss Neg Hx     Social History Social History   Tobacco Use  . Smoking status: Never Smoker  . Smokeless tobacco: Never Used  Substance Use Topics  . Alcohol use: No  . Drug use: No     Allergies  Compazine [prochlorperazine edisylate] and Reglan [metoclopramide]   Review of Systems Review of Systems   Physical Exam Triage Vital Signs ED Triage Vitals [06/19/18 1324]  Enc Vitals Group     BP 106/67     Pulse Rate 93     Resp 18     Temp 98.6 F (37 C)     Temp src      SpO2 99 %     Weight      Height      Head Circumference      Peak Flow      Pain Score      Pain Loc      Pain Edu?      Excl. in GC?    No data found.  Updated Vital Signs BP 106/67   Pulse 93   Temp 98.6 F (37 C)   Resp 18   SpO2 99%   Visual Acuity Right Eye Distance:   Left Eye Distance:   Bilateral Distance:    Right Eye Near:   Left Eye Near:    Bilateral Near:     Physical Exam  Constitutional: She is oriented to person, place, and time. She appears well-developed and well-nourished. No distress.  Cardiovascular: Normal rate, regular rhythm and normal heart sounds.  Pulmonary/Chest: Effort normal and breath sounds normal.  Musculoskeletal:       Feet:  Neurological: She is alert and oriented to person, place, and time.  Skin: Skin is warm and dry.     UC Treatments / Results   Labs (all labs ordered are listed, but only abnormal results are displayed) Labs Reviewed - No data to display  EKG None  Radiology No results found.  Procedures Foreign Body Removal Date/Time: 06/19/2018 2:07 PM Performed by: Georgetta HaberBurky, Natalie B, NP Authorized by: Mardella LaymanHagler, Brian, MD   Consent:    Consent obtained:  Verbal   Consent given by:  Patient   Risks discussed:  Infection, pain, poor cosmetic result and worsening of condition   Alternatives discussed:  Observation, referral and no treatment Location:    Location:  Foot   Foot location:  L sole   Depth:  Subcutaneous   Tendon involvement:  None Pre-procedure details:    Imaging:  None   Neurovascular status: intact   Anesthesia (see MAR for exact dosages):    Anesthesia method:  None Procedure type:    Procedure complexity:  Simple Procedure details:    Scalpel size:  11 (shaved thin layer above sliver )   Removal mechanism: 18g needle    Foreign bodies recovered:  1   Intact foreign body removal: yes   Post-procedure details:    Neurovascular status: intact     Confirmation:  No additional foreign bodies on visualization   Skin closure:  None   Dressing:  Open (no dressing)   Patient tolerance of procedure:  Tolerated well, no immediate complications   (including critical care time)  Medications Ordered in UC Medications - No data to display  Initial Impression / Assessment and Plan / UC Course  I have reviewed the triage vital signs and the nursing notes.  Pertinent labs & imaging results that were available during my care of the patient were reviewed by me and considered in my medical decision making (see chart for details).     Sliver removed without difficulty. Skin care discussed for follow up. Return precautions provided. Patient verbalized understanding and agreeable to plan.  Ambulatory out of clinic without  difficulty.    Final Clinical Impressions(s) / UC Diagnoses   Final diagnoses:   Superficial foreign body (sliver)     Discharge Instructions     Soak foot with soap and water twice a day to thoroughly cleanse area and ensure complete sliver removal.  Return to be seen if worsening of redness, swelling or pus/drainage from the area.    ED Prescriptions    None     Controlled Substance Prescriptions [place] Controlled Substance Registry consulted? Not Applicable   Georgetta Haber, NP 06/19/18 1409

## 2018-06-19 NOTE — Discharge Instructions (Signed)
Soak foot with soap and water twice a day to thoroughly cleanse area and ensure complete sliver removal.  Return to be seen if worsening of redness, swelling or pus/drainage from the area.

## 2018-07-09 ENCOUNTER — Other Ambulatory Visit: Payer: Self-pay

## 2018-07-09 ENCOUNTER — Encounter (HOSPITAL_COMMUNITY): Payer: Self-pay | Admitting: Emergency Medicine

## 2018-07-09 ENCOUNTER — Ambulatory Visit (HOSPITAL_COMMUNITY)
Admission: EM | Admit: 2018-07-09 | Discharge: 2018-07-09 | Disposition: A | Discharge status: Home / Self Care | Payer: Medicaid Other | Attending: Family Medicine | Admitting: Family Medicine

## 2018-07-09 DIAGNOSIS — K529 Noninfective gastroenteritis and colitis, unspecified: Secondary | ICD-10-CM | POA: Diagnosis not present

## 2018-07-09 MED ORDER — PROMETHAZINE HCL 12.5 MG PO TABS: 12.5000 mg | ORAL_TABLET | Freq: Four times a day (QID) | ORAL | 0 refills | Status: DC | PRN

## 2018-07-09 NOTE — ED Triage Notes (Signed)
Pt reports N&V w/ body aches since last night.

## 2018-07-09 NOTE — Discharge Instructions (Addendum)

## 2018-07-09 NOTE — ED Provider Notes (Signed)
[facility]   [id number] 07/09/18 Arrival Time: 1151  ASSESSMENT & PLAN:  1. Gastroenteritis     Meds ordered this encounter  Medications  . promethazine (PHENERGAN) 12.5 MG tablet    Sig: Take 1-2 tablets (12.5-25 mg total) by mouth every 6 (six) hours as needed for nausea or vomiting.    Dispense:  20 tablet    Refill:  0   Declines Rx of Zofran. No need for IVF at this time. Discussed typical duration of symptoms for suspected viral GI illness. Will do her best to ensure adequate fluid intake in order to avoid dehydration. Will proceed to the Emergency Department for evaluation if unable to tolerate PO fluids regularly.  Otherwise she will f/u with her PCP or here if not showing improvement over the next 24-48 hours.  Reviewed expectations re: course of current medical issues. Questions answered. Outlined signs and symptoms indicating need for more acute intervention. Patient verbalized understanding. After Visit Summary given.   SUBJECTIVE: History from: patient.  Kellie Davila is a 32 y.o. female who presents with complaint of non-bloody intermittent nausea and vomiting of undigested food without diarrhea. Loose stool. Onset yesterday and abrupt. Abdominal discomfort: mild and cramping. Symptoms are stable since beginning. Aggravating factors: eating. Alleviating factors: none. Associated symptoms: fatigue and body aches. She denies fever. Appetite: decreased. PO intake: decreased. Ambulatory without assistance. Urinary symptoms: none. Last bowel movement yesterday without blood. OTC treatment: none.  Patient's last menstrual period was 06/18/2018 (exact date).  Past Surgical History:  Procedure Laterality Date  . CESAREAN SECTION      ROS: As per HPI. All other systems negative.  OBJECTIVE:  Vitals:   07/09/18 1302  BP: (!) 100/57  Pulse: (!) 58  Temp: 98.4 F (36.9 C)  TempSrc: Oral  SpO2: 100%    General appearance: alert; no distress but  appears fatigued Oropharynx: moist Lungs: clear to auscultation bilaterally Heart: regular rate and rhythm; recheck pulse 64 Abdomen: soft; non-distended; no significant abdominal tenderness; bowel sounds present; no masses or organomegaly; no guarding or rebound tenderness Back: no CVA tenderness Extremities: no edema; symmetrical with no gross deformities Skin: warm and dry Neurologic: normal gait Psychological: alert and cooperative; normal mood and affect   Allergies  Allergen Reactions  . Compazine [Prochlorperazine Edisylate] Anxiety  . Reglan [Metoclopramide] Anxiety                                               Past Medical History:  Diagnosis Date  . Anemia   . Anxiety    worse when on meds  . Vaginal Pap smear, abnormal    did bx, ok since   Social History   Socioeconomic History  . Marital status: Single    Spouse name: Not on file  . Number of children: Not on file  . Years of education: Not on file  . Highest education level: Not on file  Occupational History  . Not on file  Social Needs  . Financial resource strain: Not on file  . Food insecurity:    Worry: Not on file    Inability: Not on file  . Transportation needs:    Medical: Not on file    Non-medical: Not on file  Tobacco Use  . Smoking status: Never Smoker  . Smokeless tobacco: Never Used  Substance and Sexual Activity  .  Alcohol use: No  . Drug use: No  . Sexual activity: Not Currently    Birth control/protection: Abstinence  Lifestyle  . Physical activity:    Days per week: Not on file    Minutes per session: Not on file  . Stress: Not on file  Relationships  . Social connections:    Talks on phone: Not on file    Gets together: Not on file    Attends religious service: Not on file    Active member of club or organization: Not on file    Attends meetings of clubs or organizations: Not on file    Relationship status: Not on file  . Intimate partner violence:    Fear of current  or ex partner: Not on file    Emotionally abused: Not on file    Physically abused: Not on file    Forced sexual activity: Not on file  Other Topics Concern  . Not on file  Social History Narrative  . Not on file   Family History  Problem Relation Age of Onset  . Alcohol abuse Neg Hx   . Arthritis Neg Hx   . Asthma Neg Hx   . Birth defects Neg Hx   . Cancer Neg Hx   . COPD Neg Hx   . Depression Neg Hx   . Diabetes Neg Hx   . Drug abuse Neg Hx   . Early death Neg Hx   . Hearing loss Neg Hx   . Heart disease Neg Hx   . Hyperlipidemia Neg Hx   . Hypertension Neg Hx   . Kidney disease Neg Hx   . Learning disabilities Neg Hx   . Mental illness Neg Hx   . Mental retardation Neg Hx   . Miscarriages / Stillbirths Neg Hx   . Stroke Neg Hx   . Vision loss Neg Hx      Mardella Layman, MD 07/16/18 1115

## 2018-09-16 ENCOUNTER — Encounter (HOSPITAL_COMMUNITY): Payer: Self-pay | Admitting: Emergency Medicine

## 2018-09-16 ENCOUNTER — Ambulatory Visit (HOSPITAL_COMMUNITY)
Admission: EM | Admit: 2018-09-16 | Discharge: 2018-09-16 | Disposition: A | Discharge status: Home / Self Care | Payer: Medicaid Other | Attending: Family Medicine | Admitting: Family Medicine

## 2018-09-16 ENCOUNTER — Other Ambulatory Visit: Payer: Self-pay

## 2018-09-16 DIAGNOSIS — R3129 Other microscopic hematuria: Secondary | ICD-10-CM

## 2018-09-16 DIAGNOSIS — R350 Frequency of micturition: Secondary | ICD-10-CM

## 2018-09-16 LAB — POCT URINALYSIS DIP (DEVICE)
BILIRUBIN URINE: NEGATIVE
Glucose, UA: NEGATIVE mg/dL
KETONES UR: NEGATIVE mg/dL
Leukocytes, UA: NEGATIVE
NITRITE: NEGATIVE
PH: 6.5 (ref 5.0–8.0)
Protein, ur: NEGATIVE mg/dL
Specific Gravity, Urine: 1.02 (ref 1.005–1.030)
Urobilinogen, UA: 0.2 mg/dL (ref 0.0–1.0)

## 2018-09-16 LAB — POCT PREGNANCY, URINE: PREG TEST UR: NEGATIVE

## 2018-09-16 MED ORDER — PROMETHAZINE HCL 12.5 MG PO TABS: 12.5000 mg | ORAL_TABLET | Freq: Four times a day (QID) | ORAL | 0 refills | Status: DC | PRN

## 2018-09-16 MED ORDER — TAMSULOSIN HCL 0.4 MG PO CAPS: 0.4000 mg | ORAL_CAPSULE | Freq: Every day | ORAL | 0 refills | Status: DC

## 2018-09-16 NOTE — Discharge Instructions (Signed)
As discussed, no obvious infection in your urine today. There is some blood in the urine, and this can indicate kidney stone. Start flomax as directed. Phenergan for nausea/vomiting. Keep hydrated, your urine should be clear to pale yellow in color. Your urine is sent for culture, you will be contacted if you need antibiotics. Follow up with PCP if symptoms not improving. If experiencing worsening symptoms, severe pain, nausea/vomiting not controlled by medication, fever, go to the emergency department for further evaluation needed.

## 2018-09-16 NOTE — ED Provider Notes (Signed)
[facility]    CSN: [id number] Arrival date & time: 09/16/18  1601     History   Chief Complaint Chief Complaint  Patient presents with  . Urinary Tract Infection    HPI Kellie Davila is a 32 y.o. female.   32 year old female comes in for few day history of urinary symptoms.  States has had urinary frequency, right flank pain/cramping, decreased appetite, nausea.  She denies dysuria, hematuria.  Denies abdominal pain, vomiting.  Denies constipation, diarrhea.  Mild vaginal itching without discharge, pain, irritation.  LMP 09/02/2018.  No history of kidney stones.     Past Medical History:  Diagnosis Date  . Anemia   . Anxiety    worse when on meds  . Vaginal Pap smear, abnormal    did bx, ok since    Patient Active Problem List   Diagnosis Date Noted  . Bacterial vaginitis 07/05/2017  . Abdominal pain affecting pregnancy, antepartum 01/25/2016  . Previous cesarean delivery affecting pregnancy, antepartum 01/25/2016  . 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 Abnormal Pap smear of cervix 12/27/2014    Past Surgical History:  Procedure Laterality Date  . CESAREAN SECTION      OB History    Gravida  3   Para  1   Term  1   Preterm      AB  2   Living  1     SAB  0   TAB  2   Ectopic  0   Multiple  0   Live Births  1            Home Medications    Prior to Admission medications   Medication Sig Start Date End Date Taking? Authorizing Provider  promethazine (PHENERGAN) 12.5 MG tablet Take 1-2 tablets (12.5-25 mg total) by mouth every 6 (six) hours as needed for nausea or vomiting. 09/16/18   Cathie Hoops, Graycen Degan V, PA-C  tamsulosin (FLOMAX) 0.4 MG CAPS capsule Take 1 capsule (0.4 mg total) by mouth daily. 09/16/18   Belinda Fisher, PA-C    Family History Family History  Problem Relation Age of Onset  . Alcohol abuse Neg Hx   . Arthritis Neg Hx   . Asthma Neg Hx   . Birth defects Neg Hx   . Cancer Neg Hx   . COPD Neg Hx   . Depression Neg Hx     . Diabetes Neg Hx   . Drug abuse Neg Hx   . Early death Neg Hx   . Hearing loss Neg Hx   . Heart disease Neg Hx   . Hyperlipidemia Neg Hx   . Hypertension Neg Hx   . Kidney disease Neg Hx   . Learning disabilities Neg Hx   . Mental illness Neg Hx   . Mental retardation Neg Hx   . Miscarriages / Stillbirths Neg Hx   . Stroke Neg Hx   . Vision loss Neg Hx     Social History Social History   Tobacco Use  . Smoking status: Never Smoker  . Smokeless tobacco: Never Used  Substance Use Topics  . Alcohol use: No  . Drug use: No     Allergies   Compazine [prochlorperazine edisylate] and Reglan [metoclopramide]   Review of Systems Review of Systems  Reason unable to perform ROS: See HPI as above.     Physical Exam Triage Vital Signs ED Triage Vitals  Enc Vitals Group     BP 09/16/18 1635 119/75  Pulse Rate 09/16/18 1635 67     Resp 09/16/18 1635 18     Temp 09/16/18 1635 98.6 F (37 C)     Temp Source 09/16/18 1635 Oral     SpO2 09/16/18 1635 100 %     Weight --      Height --      Head Circumference --      Peak Flow --      Pain Score 09/16/18 1633 6     Pain Loc --      Pain Edu? --      Excl. in GC? --    No data found.  Updated Vital Signs BP 119/75 (BP Location: Right Arm)   Pulse 67   Temp 98.6 F (37 C) (Oral)   Resp 18   LMP 09/02/2018   SpO2 100%   Physical Exam  Constitutional: She is oriented to person, place, and time. She appears well-developed and well-nourished. No distress.  HENT:  Head: Normocephalic and atraumatic.  Eyes: Pupils are equal, round, and reactive to light. Conjunctivae are normal.  Cardiovascular: Normal rate, regular rhythm and normal heart sounds. Exam reveals no gallop and no friction rub.  No murmur heard. Pulmonary/Chest: Effort normal and breath sounds normal. She has no wheezes. She has no rales.  Abdominal: Soft. Bowel sounds are normal. She exhibits no mass. There is no tenderness. There is no rebound, no  guarding and no CVA tenderness.  Musculoskeletal:  No tenderness to palpation of the back.   Neurological: She is alert and oriented to person, place, and time.  Skin: Skin is warm and dry. She is not diaphoretic.  Psychiatric: She has a normal mood and affect. Her behavior is normal. Judgment normal.     UC Treatments / Results  Labs (all labs ordered are listed, but only abnormal results are displayed) Labs Reviewed  POCT URINALYSIS DIP (DEVICE) - Abnormal; Notable for the following components:      Result Value   Hgb urine dipstick TRACE (*)    All other components within normal limits  URINE CULTURE  POCT PREGNANCY, URINE    EKG None  Radiology No results found.  Procedures Procedures (including critical care time)  Medications Ordered in UC Medications - No data to display  Initial Impression / Assessment and Plan / UC Course  I have reviewed the triage vital signs and the nursing notes.  Pertinent labs & imaging results that were available during my care of the patient were reviewed by me and considered in my medical decision making (see chart for details).    Patient without current flank pain, negative CVA tenderness/tenderness to palpation.  Given trace blood in urine, discussed possibility of kidney stones.  Will send urine for culture.  Have patient start Flomax for possible kidney stones.  Patient requesting Phenergan for nausea, given it works better than Zofran.  Push fluids.  Return precautions given.  Patient expresses understanding and agrees to plan.  Final Clinical Impressions(s) / UC Diagnoses   Final diagnoses:  Urinary frequency  Other microscopic hematuria    ED Prescriptions    Medication Sig Dispense Auth. Provider   promethazine (PHENERGAN) 12.5 MG tablet Take 1-2 tablets (12.5-25 mg total) by mouth every 6 (six) hours as needed for nausea or vomiting. 20 tablet Srihari Shellhammer V, PA-C   tamsulosin (FLOMAX) 0.4 MG CAPS capsule Take 1 capsule (0.4  mg total) by mouth daily. 10 capsule Belinda Fisher, PA-C  Belinda FisherYu, Lamine Laton V, PA-C 09/16/18 [phone]

## 2018-09-16 NOTE — ED Triage Notes (Signed)
Symptoms started 2-3 days ago.  Right flank pain, poor appetite and nausea.  No painful urination, but does feel the need to go more often

## 2018-09-22 ENCOUNTER — Other Ambulatory Visit: Payer: Self-pay

## 2018-09-22 ENCOUNTER — Ambulatory Visit (HOSPITAL_COMMUNITY)
Admission: EM | Admit: 2018-09-22 | Discharge: 2018-09-22 | Disposition: A | Discharge status: Home / Self Care | Payer: Medicaid Other | Attending: Family Medicine | Admitting: Family Medicine

## 2018-09-22 ENCOUNTER — Encounter (HOSPITAL_COMMUNITY): Payer: Self-pay

## 2018-09-22 DIAGNOSIS — R102 Pelvic and perineal pain: Secondary | ICD-10-CM | POA: Insufficient documentation

## 2018-09-22 DIAGNOSIS — M545 Low back pain, unspecified: Secondary | ICD-10-CM

## 2018-09-22 DIAGNOSIS — Z888 Allergy status to other drugs, medicaments and biological substances status: Secondary | ICD-10-CM | POA: Insufficient documentation

## 2018-09-22 DIAGNOSIS — R1030 Lower abdominal pain, unspecified: Secondary | ICD-10-CM | POA: Diagnosis present

## 2018-09-22 LAB — POCT URINALYSIS DIP (DEVICE)
Bilirubin Urine: NEGATIVE
GLUCOSE, UA: NEGATIVE mg/dL
Ketones, ur: NEGATIVE mg/dL
LEUKOCYTES UA: NEGATIVE
Nitrite: NEGATIVE
Protein, ur: NEGATIVE mg/dL
Specific Gravity, Urine: 1.015 (ref 1.005–1.030)
UROBILINOGEN UA: 0.2 mg/dL (ref 0.0–1.0)
pH: 6 (ref 5.0–8.0)

## 2018-09-22 LAB — POCT PREGNANCY, URINE: Preg Test, Ur: NEGATIVE

## 2018-09-22 MED ORDER — MELOXICAM 7.5 MG PO TABS: 7.5000 mg | ORAL_TABLET | Freq: Every day | ORAL | 0 refills | Status: DC

## 2018-09-22 MED ORDER — CYCLOBENZAPRINE HCL 10 MG PO TABS: 10.0000 mg | ORAL_TABLET | Freq: Two times a day (BID) | ORAL | 0 refills | Status: DC | PRN

## 2018-09-22 NOTE — Discharge Instructions (Signed)
I am treating you today for muscle pain with Flexeril and meloxicam for pain inflammation. We are sending the swab for testing and we will call you with any positive results Your urine was negative for infection If your symptoms continue despite taking the medication you may want a follow-up with your doctor for further evaluation

## 2018-09-22 NOTE — ED Triage Notes (Signed)
Pt cc lower abdominal pain near the grion area. And back pain x 5 days

## 2018-09-23 LAB — CERVICOVAGINAL ANCILLARY ONLY
Bacterial vaginitis: POSITIVE — AB
CANDIDA VAGINITIS: NEGATIVE
CHLAMYDIA, DNA PROBE: NEGATIVE
NEISSERIA GONORRHEA: NEGATIVE
Trichomonas: NEGATIVE

## 2018-09-24 ENCOUNTER — Telehealth (HOSPITAL_COMMUNITY): Payer: Self-pay | Admitting: Emergency Medicine

## 2018-09-24 MED ORDER — METRONIDAZOLE 0.75 % VA GEL: 1.0000 | Freq: Every day | VAGINAL | 0 refills | Status: AC

## 2018-09-24 NOTE — Telephone Encounter (Signed)
Bacterial vaginosis is positive. This was not treated at the urgent care visit.  Metrogel 0.75% no refills sent to patients pharmacy of choice. Pt contacted and made aware, verbalized understanding. All questions answered.

## 2018-09-26 NOTE — ED Provider Notes (Signed)
[facility]    CSN: [id number] Arrival date & time: 09/22/18  1801     History   Chief Complaint Chief Complaint  Patient presents with  . Abdominal Pain  . Back Pain    HPI Kellie Davila is a 32 y.o. female.   Pt is a 32 year old female that presents with lower abdominal pain and back pain x 5 days. This has been constant and remained the same. She has not taken anything for her symptoms. She was recently seen here and treated for a possible kidney stone on 09/16/18. She was treated with Flomax which she reports didn't do much for her pain. She denies any associated dysuria, hematuria, urinary frequency. She denies any vaginal discharge or bleeding. She is currently sexually active with one partner and denies any worry for STDs. LMP 09/02/18.   ROS per HPI    Abdominal Pain  Back Pain  Associated symptoms: abdominal pain     Past Medical History:  Diagnosis Date  . Anemia   . Anxiety    worse when on meds  . Vaginal Pap smear, abnormal    did bx, ok since    Patient Active Problem List   Diagnosis Date Noted  . Bacterial vaginitis 07/05/2017  . Abdominal pain affecting pregnancy, antepartum 01/25/2016  . Previous cesarean delivery affecting pregnancy, antepartum 01/25/2016  . 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 Abnormal Pap smear of cervix 12/27/2014    Past Surgical History:  Procedure Laterality Date  . CESAREAN SECTION      OB History    Gravida  3   Para  1   Term  1   Preterm      AB  2   Living  1     SAB  0   TAB  2   Ectopic  0   Multiple  0   Live Births  1            Home Medications    Prior to Admission medications   Medication Sig Start Date End Date Taking? Authorizing Provider  cyclobenzaprine (FLEXERIL) 10 MG tablet Take 1 tablet (10 mg total) by mouth 2 (two) times daily as needed for muscle spasms. 09/22/18   Dahlia Byes A, NP  meloxicam (MOBIC) 7.5 MG tablet Take 1 tablet (7.5 mg total) by mouth  daily. 09/22/18   Dahlia Byes A, NP  metroNIDAZOLE (METROGEL VAGINAL) 0.75 % vaginal gel Place 1 Applicatorful vaginally at bedtime for 5 days. 09/24/18 09/29/18  Eustace Moore, MD  promethazine (PHENERGAN) 12.5 MG tablet Take 1-2 tablets (12.5-25 mg total) by mouth every 6 (six) hours as needed for nausea or vomiting. 09/16/18   Cathie Hoops, Amy V, PA-C  tamsulosin (FLOMAX) 0.4 MG CAPS capsule Take 1 capsule (0.4 mg total) by mouth daily. 09/16/18   Belinda Fisher, PA-C    Family History Family History  Problem Relation Age of Onset  . Alcohol abuse Neg Hx   . Arthritis Neg Hx   . Asthma Neg Hx   . Birth defects Neg Hx   . Cancer Neg Hx   . COPD Neg Hx   . Depression Neg Hx   . Diabetes Neg Hx   . Drug abuse Neg Hx   . Early death Neg Hx   . Hearing loss Neg Hx   . Heart disease Neg Hx   . Hyperlipidemia Neg Hx   . Hypertension Neg Hx   . Kidney disease Neg Hx   .  Learning disabilities Neg Hx   . Mental illness Neg Hx   . Mental retardation Neg Hx   . Miscarriages / Stillbirths Neg Hx   . Stroke Neg Hx   . Vision loss Neg Hx     Social History Social History   Tobacco Use  . Smoking status: Never Smoker  . Smokeless tobacco: Never Used  Substance Use Topics  . Alcohol use: No  . Drug use: No     Allergies   Compazine [prochlorperazine edisylate] and Reglan [metoclopramide]   Review of Systems Review of Systems  Gastrointestinal: Positive for abdominal pain.  Musculoskeletal: Positive for back pain.     Physical Exam Triage Vital Signs ED Triage Vitals  Enc Vitals Group     BP 09/22/18 1906 112/62     Pulse Rate 09/22/18 1906 68     Resp 09/22/18 1906 18     Temp 09/22/18 1906 98.4 F (36.9 C)     Temp Source 09/22/18 1906 Oral     SpO2 09/22/18 1906 100 %     Weight --      Height --      Head Circumference --      Peak Flow --      Pain Score 09/22/18 1904 5     Pain Loc --      Pain Edu? --      Excl. in GC? --    No data found.  Updated Vital  Signs BP 112/62 (BP Location: Right Arm)   Pulse 68   Temp 98.4 F (36.9 C) (Oral)   Resp 18   LMP 09/02/2018   SpO2 100%   Visual Acuity Right Eye Distance:   Left Eye Distance:   Bilateral Distance:    Right Eye Near:   Left Eye Near:    Bilateral Near:     Physical Exam  Constitutional: She appears well-developed and well-nourished.  Non-toxic appearance. She does not appear ill.  HENT:  Head: Normocephalic.  Pulmonary/Chest: Effort normal.  Abdominal: Soft. Normal appearance and bowel sounds are normal. There is tenderness in the suprapubic area. There is no rebound and no CVA tenderness.  Genitourinary:  Genitourinary Comments: External vagina normal without lesions, erythema or swelling.  Normal vaginal discharge at vaginal opening Speculum exam with normal cervix and discharge in the vaginal vault. No lesions, odor or bleeding.  No CMT, masses.   Neurological: She is alert.  Skin: Skin is warm and dry.  Psychiatric: She has a normal mood and affect.  Nursing note and vitals reviewed.    UC Treatments / Results  Labs (all labs ordered are listed, but only abnormal results are displayed) Labs Reviewed  POCT URINALYSIS DIP (DEVICE) - Abnormal; Notable for the following components:      Result Value   Hgb urine dipstick TRACE (*)    All other components within normal limits  CERVICOVAGINAL ANCILLARY ONLY - Abnormal; Notable for the following components:   Bacterial vaginitis **POSITIVE for Gardnerella vaginalis** (*)    All other components within normal limits  POCT PREGNANCY, URINE    EKG None  Radiology No results found.  Procedures Procedures (including critical care time)  Medications Ordered in UC Medications - No data to display  Initial Impression / Assessment and Plan / UC Course  I have reviewed the triage vital signs and the nursing notes.  Pertinent labs & imaging results that were available during my care of the patient were reviewed by  me and  considered in my medical decision making (see chart for details).     Most likely pt symptoms are coming from muscle strain.  Other likely dx is BV or some sort of STD Vaginal exam appears normal and swab sent for testing.  No concern for PID. VSS, non toxic or ill appearing.  Urine negative for pregnancy and infection as before Trace blood but this appears chronic Told to stop the Flomax and try flexeril and meloxicam until we get the swab results.  Pt understanding and agreed.  Final Clinical Impressions(s) / UC Diagnoses   Final diagnoses:  Acute right-sided low back pain without sciatica  Pelvic pain     Discharge Instructions     I am treating you today for muscle pain with Flexeril and meloxicam for pain inflammation. We are sending the swab for testing and we will call you with any positive results Your urine was negative for infection If your symptoms continue despite taking the medication you may want a follow-up with your doctor for further evaluation    ED Prescriptions    Medication Sig Dispense Auth. Provider   meloxicam (MOBIC) 7.5 MG tablet Take 1 tablet (7.5 mg total) by mouth daily. 30 tablet Rachana Malesky A, NP   cyclobenzaprine (FLEXERIL) 10 MG tablet Take 1 tablet (10 mg total) by mouth 2 (two) times daily as needed for muscle spasms. 20 tablet Dahlia ByesBast, Priscillia Fouch A, NP     Controlled Substance Prescriptions [place] Controlled Substance Registry consulted? Not Applicable   Janace ArisBast, Markelle Najarian A, NP 09/26/18 [phone]

## 2018-10-08 ENCOUNTER — Encounter (HOSPITAL_COMMUNITY): Payer: Self-pay | Admitting: Emergency Medicine

## 2018-10-08 ENCOUNTER — Ambulatory Visit (HOSPITAL_COMMUNITY)
Admission: EM | Admit: 2018-10-08 | Discharge: 2018-10-08 | Disposition: A | Discharge status: Home / Self Care | Payer: Medicaid Other | Attending: Family Medicine | Admitting: Family Medicine

## 2018-10-08 DIAGNOSIS — J0100 Acute maxillary sinusitis, unspecified: Secondary | ICD-10-CM | POA: Diagnosis not present

## 2018-10-08 MED ORDER — CETIRIZINE-PSEUDOEPHEDRINE ER 5-120 MG PO TB12: 1.0000 | ORAL_TABLET | Freq: Every day | ORAL | 0 refills | Status: DC

## 2018-10-08 MED ORDER — AMOXICILLIN-POT CLAVULANATE 875-125 MG PO TABS: 1.0000 | ORAL_TABLET | Freq: Two times a day (BID) | ORAL | 0 refills | Status: AC

## 2018-10-08 NOTE — ED Notes (Signed)
Patient able to ambulate independently  

## 2018-10-08 NOTE — Discharge Instructions (Signed)
Get plenty of rest and push fluids Zyrtec-D prescribed for nasal congestion, runny nose, and/or sore throat Continue with flonase daily.   Augmentin prescribed.  Take as directed and to completion Use OTC medications like ibuprofen or tylenol as needed fever or pain Follow up with PCP in 1-2 days for recheck if symptoms persists Return or go to ER if you have any new or worsening symptoms worsening headache, fever, chills, nausea,  vomiting, chest pain, cough, shortness of breath, wheezing, abdominal pain, changes in bowel or bladder habits, no improvement in symptoms within 24-48 hours despite medication, etc...Marland Kitchen

## 2018-10-08 NOTE — ED Triage Notes (Signed)
[facility] days, developing through out the day Monday.  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  C/o dizziness, light-headedness, and spreading/increasing pain.

## 2018-10-08 NOTE — ED Provider Notes (Signed)
[facility]   [id number] 10/08/18 Arrival Time: 1145   CC: Sinus HA  SUBJECTIVE: History from: patient.  Kellie Davila is a 32 y.o. female hx significant for cerebellar cyst, who presents with runny nose, nasal congestion, sinus pain/pressure, and mild sore throat x 1 week.  Denies positive sick exposure or precipitating event.  Has tried OTC analgesics without relief.  Symptoms are made worse with leaning forward.  Hx significant for HA/migraines, but this feels more "sinus" related. Complains of associated lightheadedness upon standing, but attributes that to not drinking enough water. Denies fever, chills, fatigue, vision changes, motor weakness, sensation changes, SOB, wheezing, chest pain, nausea, changes in bowel or bladder habits.   ROS: As per HPI.  Past Medical History:  Diagnosis Date  . Anemia   . Anxiety    worse when on meds  . Vaginal Pap smear, abnormal    did bx, ok since   Past Surgical History:  Procedure Laterality Date  . CESAREAN SECTION     Allergies  Allergen Reactions  . Compazine [Prochlorperazine Edisylate] Anxiety  . Reglan [Metoclopramide] Anxiety   No current facility-administered medications on file prior to encounter.    Current Outpatient Medications on File Prior to Encounter  Medication Sig Dispense Refill  . cyclobenzaprine (FLEXERIL) 10 MG tablet Take 1 tablet (10 mg total) by mouth 2 (two) times daily as needed for muscle spasms. 20 tablet 0  . meloxicam (MOBIC) 7.5 MG tablet Take 1 tablet (7.5 mg total) by mouth daily. 30 tablet 0  . promethazine (PHENERGAN) 12.5 MG tablet Take 1-2 tablets (12.5-25 mg total) by mouth every 6 (six) hours as needed for nausea or vomiting. 20 tablet 0  . tamsulosin (FLOMAX) 0.4 MG CAPS capsule Take 1 capsule (0.4 mg total) by mouth daily. 10 capsule 0   Social History   Socioeconomic History  . Marital status: Single    Spouse name: Not on file  . Number of children: Not on file  . Years  of education: Not on file  . Highest education level: Not on file  Occupational History  . Not on file  Social Needs  . Financial resource strain: Not on file  . Food insecurity:    Worry: Not on file    Inability: Not on file  . Transportation needs:    Medical: Not on file    Non-medical: Not on file  Tobacco Use  . Smoking status: Never Smoker  . Smokeless tobacco: Never Used  Substance and Sexual Activity  . Alcohol use: No  . Drug use: No  . Sexual activity: Not Currently    Birth control/protection: Abstinence  Lifestyle  . Physical activity:    Days per week: Not on file    Minutes per session: Not on file  . Stress: Not on file  Relationships  . Social connections:    Talks on phone: Not on file    Gets together: Not on file    Attends religious service: Not on file    Active member of club or organization: Not on file    Attends meetings of clubs or organizations: Not on file    Relationship status: Not on file  . Intimate partner violence:    Fear of current or ex partner: Not on file    Emotionally abused: Not on file    Physically abused: Not on file    Forced sexual activity: Not on file  Other Topics Concern  . Not on file  Social History Narrative  . Not on file   Family History  Problem Relation Age of Onset  . Alcohol abuse Neg Hx   . Arthritis Neg Hx   . Asthma Neg Hx   . Birth defects Neg Hx   . Cancer Neg Hx   . COPD Neg Hx   . Depression Neg Hx   . Diabetes Neg Hx   . Drug abuse Neg Hx   . Early death Neg Hx   . Hearing loss Neg Hx   . Heart disease Neg Hx   . Hyperlipidemia Neg Hx   . Hypertension Neg Hx   . Kidney disease Neg Hx   . Learning disabilities Neg Hx   . Mental illness Neg Hx   . Mental retardation Neg Hx   . Miscarriages / Stillbirths Neg Hx   . Stroke Neg Hx   . Vision loss Neg Hx     OBJECTIVE:  Vitals:   10/08/18 1308  BP: 111/68  Pulse: 67  Resp: 18  Temp: 98.5 F (36.9 C)  TempSrc: Oral  SpO2: 100%       General appearance: alert; appears mildly fatigued, but nontoxic; speaking in full sentences and tolerating own secretions HEENT: NCAT; Ears: EACs clear, TMs pearly gray; Eyes: PERRL.  EOM grossly intact. Sinuses: maxillary sinus tenderness; Nose: nares erythematous and turbinates swollen with mild clear rhinorrhea, Throat: oropharynx clear, tonsils non erythematous or enlarged, uvula midline  Neck: supple without LAD Lungs: unlabored respirations, symmetrical air entry; cough: mild; no respiratory distress; CTAB Heart: regular rate and rhythm.  Radial pulses 2+ symmetrical bilaterally Skin: warm and dry Neuro: ambulates without difficulty; CN 2-12 intact grossly; strength and sensation intact about the upper and lower extremities; finger to nose without difficulty; negative pronator drift Psychological: alert and cooperative; normal mood and affect  ASSESSMENT & PLAN:  1. Acute non-recurrent maxillary sinusitis     Meds ordered this encounter  Medications  . amoxicillin-clavulanate (AUGMENTIN) 875-125 MG tablet    Sig: Take 1 tablet by mouth every 12 (twelve) hours for 10 days.    Dispense:  20 tablet    Refill:  0    Order Specific Question:   Supervising Provider    Answer:   Eustace Moore [[id number]]  . cetirizine-pseudoephedrine (ZYRTEC-D) 5-120 MG tablet    Sig: Take 1 tablet by mouth daily.    Dispense:  30 tablet    Refill:  0    Order Specific Question:   Supervising Provider    Answer:   Eustace Moore [[id number]]    Get plenty of rest and push fluids Zyrtec-D prescribed for nasal congestion, runny nose, and/or sore throat Continue with flonase daily.   Augmentin prescribed.  Take as directed and to completion Use OTC medications like ibuprofen or tylenol as needed fever or pain Follow up with PCP in 1-2 days for recheck if symptoms persists Return or go to ER if you have any new or worsening symptoms worsening headache, fever, chills, nausea,  vomiting,  chest pain, cough, shortness of breath, wheezing, abdominal pain, changes in bowel or bladder habits, no improvement in symptoms within 24-48 hours despite medication, etc...  Reviewed expectations re: course of current medical issues. Questions answered. Outlined signs and symptoms indicating need for more acute intervention. Patient verbalized understanding. After Visit Summary given.         Rennis Harding, PA-C 10/08/18 1410

## 2018-11-26 ENCOUNTER — Encounter (HOSPITAL_COMMUNITY): Payer: Self-pay

## 2018-11-26 ENCOUNTER — Ambulatory Visit (HOSPITAL_COMMUNITY)
Admission: EM | Admit: 2018-11-26 | Discharge: 2018-11-26 | Disposition: A | Discharge status: Home / Self Care | Payer: Medicaid Other | Attending: Emergency Medicine | Admitting: Emergency Medicine

## 2018-11-26 DIAGNOSIS — R51 Headache: Secondary | ICD-10-CM | POA: Insufficient documentation

## 2018-11-26 DIAGNOSIS — R05 Cough: Secondary | ICD-10-CM | POA: Diagnosis not present

## 2018-11-26 DIAGNOSIS — R519 Headache, unspecified: Secondary | ICD-10-CM

## 2018-11-26 DIAGNOSIS — R059 Cough, unspecified: Secondary | ICD-10-CM

## 2018-11-26 MED ORDER — PREDNISONE 10 MG (21) PO TBPK: ORAL_TABLET | Freq: Every day | ORAL | 0 refills | Status: DC

## 2018-11-26 NOTE — ED Provider Notes (Signed)
[facility]    CSN: [id number] Arrival date & time: 11/26/18  1350     History   Chief Complaint Chief Complaint  Patient presents with  . Sinus Issues  . Headache  . Facial Pain  . Chest Tightness    HPI Kellie Davila is a 33 y.o. female.   Pt has been seen at her pcp several times for sinus pressure, cough, sob. Was placed on amoxi, and 2 days ago on doxy. She believes that it is from her mothers house that had mold found in it and then her mother came to stay with her for a while. This is been going on for 1 month. Pt was referred to an ent but has not seen them yet. States that she is still having the same sx. No fever, no n/v/d. No fever      Past Medical History:  Diagnosis Date  . Anemia   . Anxiety    worse when on meds  . Vaginal Pap smear, abnormal    did bx, ok since    Patient Active Problem List   Diagnosis Date Noted  . Bacterial vaginitis 07/05/2017  . Abdominal pain affecting pregnancy, antepartum 01/25/2016  . Previous cesarean delivery affecting pregnancy, antepartum 01/25/2016  . 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 Abnormal Pap smear of cervix 12/27/2014    Past Surgical History:  Procedure Laterality Date  . CESAREAN SECTION      OB History    Gravida  3   Para  1   Term  1   Preterm      AB  2   Living  1     SAB  0   TAB  2   Ectopic  0   Multiple  0   Live Births  1            Home Medications    Prior to Admission medications   Medication Sig Start Date End Date Taking? Authorizing Provider  cetirizine-pseudoephedrine (ZYRTEC-D) 5-120 MG tablet Take 1 tablet by mouth daily. 10/08/18   Wurst, [place], PA-C  cyclobenzaprine (FLEXERIL) 10 MG tablet Take 1 tablet (10 mg total) by mouth 2 (two) times daily as needed for muscle spasms. 09/22/18   Dahlia Byes A, NP  meloxicam (MOBIC) 7.5 MG tablet Take 1 tablet (7.5 mg total) by mouth daily. 09/22/18   Bast, Gloris Manchester A, NP  predniSONE (STERAPRED UNI-PAK 21  TAB) 10 MG (21) TBPK tablet Take by mouth daily. Take 6 tabs by mouth daily  for 2 days, then 5 tabs for 2 days, then 4 tabs for 2 days, then 3 tabs for 2 days, 2 tabs for 2 days, then 1 tab by mouth daily for 2 days 11/26/18   Coralyn Mark, NP  promethazine (PHENERGAN) 12.5 MG tablet Take 1-2 tablets (12.5-25 mg total) by mouth every 6 (six) hours as needed for nausea or vomiting. 09/16/18   Cathie Hoops, Amy V, PA-C  tamsulosin (FLOMAX) 0.4 MG CAPS capsule Take 1 capsule (0.4 mg total) by mouth daily. 09/16/18   Belinda Fisher, PA-C    Family History Family History  Problem Relation Age of Onset  . Alcohol abuse Neg Hx   . Arthritis Neg Hx   . Asthma Neg Hx   . Birth defects Neg Hx   . Cancer Neg Hx   . COPD Neg Hx   . Depression Neg Hx   . Diabetes Neg Hx   . Drug abuse Neg Hx   .  Early death Neg Hx   . Hearing loss Neg Hx   . Heart disease Neg Hx   . Hyperlipidemia Neg Hx   . Hypertension Neg Hx   . Kidney disease Neg Hx   . Learning disabilities Neg Hx   . Mental illness Neg Hx   . Mental retardation Neg Hx   . Miscarriages / Stillbirths Neg Hx   . Stroke Neg Hx   . Vision loss Neg Hx     Social History Social History   Tobacco Use  . Smoking status: Never Smoker  . Smokeless tobacco: Never Used  Substance Use Topics  . Alcohol use: No  . Drug use: No     Allergies   Compazine [prochlorperazine edisylate] and Reglan [metoclopramide]   Review of Systems Review of Systems  Constitutional: Negative.   HENT: Positive for sinus pressure and sinus pain.   Eyes: Negative.   Respiratory: Positive for cough.   Cardiovascular: Negative.   Gastrointestinal: Negative.   Genitourinary: Negative.   Musculoskeletal: Negative.   Skin: Negative.   Neurological: Positive for headaches.     Physical Exam Triage Vital Signs ED Triage Vitals  Enc Vitals Group     BP 11/26/18 1423 115/60     Pulse Rate 11/26/18 1423 73     Resp 11/26/18 1423 18     Temp 11/26/18 1423 98 F  (36.7 C)     Temp Source 11/26/18 1423 Oral     SpO2 11/26/18 1423 100 %     Weight --      Height --      Head Circumference --      Peak Flow --      Pain Score 11/26/18 1424 7     Pain Loc --      Pain Edu? --      Excl. in GC? --    No data found.  Updated Vital Signs BP 115/60 (BP Location: Left Arm)   Pulse 73   Temp 98 F (36.7 C) (Oral)   Resp 18   LMP 11/11/2018   SpO2 100%   Visual Acuity Right Eye Distance:   Left Eye Distance:   Bilateral Distance:    Right Eye Near:   Left Eye Near:    Bilateral Near:     Physical Exam Vitals signs and nursing note reviewed.  Eyes:     Extraocular Movements: Extraocular movements intact.  Neck:     Musculoskeletal: Normal range of motion.  Cardiovascular:     Rate and Rhythm: Normal rate.  Pulmonary:     Effort: Pulmonary effort is normal.  Abdominal:     Palpations: Abdomen is soft.  Musculoskeletal: Normal range of motion.  Skin:    General: Skin is warm.  Neurological:     Mental Status: She is alert.     GCS: GCS eye subscore is 4. GCS verbal subscore is 5. GCS motor subscore is 6.  Psychiatric:        Mood and Affect: Mood normal.      UC Treatments / Results  Labs (all labs ordered are listed, but only abnormal results are displayed) Labs Reviewed - No data to display  EKG None  Radiology No results found.  Procedures Procedures (including critical care time)  Medications Ordered in UC Medications - No data to display  Initial Impression / Assessment and Plan / UC Course  I have reviewed the triage vital signs and the nursing notes.  Pertinent labs &  imaging results that were available during my care of the patient were reviewed by me and considered in my medical decision making (see chart for details).     Will need to see ent or someone from infectious dx if she has further sx.  At this time no change in sx will need to cont and finish the abx tx .  Will start on steroid to help  with the pressure and inflammation   Final Clinical Impressions(s) / UC Diagnoses   Final diagnoses:  Sinus headache  Cough     Discharge Instructions     Will need to see ent or someone from infectious dx if she has further sx.  At this time no change in sx will need to cont and finish the abx tx .  Will start on steroid to help with the pressure and inflammation     ED Prescriptions    Medication Sig Dispense Auth. Provider   predniSONE (STERAPRED UNI-PAK 21 TAB) 10 MG (21) TBPK tablet Take by mouth daily. Take 6 tabs by mouth daily  for 2 days, then 5 tabs for 2 days, then 4 tabs for 2 days, then 3 tabs for 2 days, 2 tabs for 2 days, then 1 tab by mouth daily for 2 days 42 tablet Coralyn MarkMitchell, Giulian Goldring L, NP     Controlled Substance Prescriptions [place] Controlled Substance Registry consulted? Not Applicable   Coralyn MarkMitchell, Rosina Cressler L, NP 11/26/18 1500

## 2018-11-26 NOTE — Discharge Instructions (Addendum)
Will need to see ent or someone from infectious dx if she has further sx.  At this time no change in sx will need to cont and finish the abx tx .  Will start on steroid to help with the pressure and inflammation

## 2018-11-26 NOTE — ED Triage Notes (Signed)
Pt presents with sinus issues; facial/sinus pressure & pain, nasal drainage, throat scratchiness, headache, shortness of breath, and chest tightness.

## 2019-01-07 ENCOUNTER — Ambulatory Visit (HOSPITAL_COMMUNITY)
Admission: EM | Admit: 2019-01-07 | Discharge: 2019-01-07 | Discharge status: Against Medical Advice | Payer: Medicaid Other

## 2019-01-07 NOTE — ED Triage Notes (Signed)
Per pt access pt lwbs 

## 2019-01-14 ENCOUNTER — Ambulatory Visit: Payer: Medicaid Other | Admitting: Allergy

## 2019-01-16 ENCOUNTER — Encounter (HOSPITAL_COMMUNITY): Payer: Self-pay

## 2019-01-16 ENCOUNTER — Other Ambulatory Visit: Payer: Self-pay

## 2019-01-16 ENCOUNTER — Ambulatory Visit (HOSPITAL_COMMUNITY)
Admission: EM | Admit: 2019-01-16 | Discharge: 2019-01-16 | Disposition: A | Discharge status: Home / Self Care | Payer: Medicaid Other | Attending: Family Medicine | Admitting: Family Medicine

## 2019-01-16 DIAGNOSIS — J3489 Other specified disorders of nose and nasal sinuses: Secondary | ICD-10-CM

## 2019-01-16 DIAGNOSIS — K5900 Constipation, unspecified: Secondary | ICD-10-CM

## 2019-01-16 LAB — POCT URINALYSIS DIP (DEVICE)
BILIRUBIN URINE: NEGATIVE
Glucose, UA: NEGATIVE mg/dL
Hgb urine dipstick: NEGATIVE
KETONES UR: NEGATIVE mg/dL
Leukocytes,Ua: NEGATIVE
Nitrite: NEGATIVE
PH: 8.5 — AB (ref 5.0–8.0)
Protein, ur: NEGATIVE mg/dL
SPECIFIC GRAVITY, URINE: 1.02 (ref 1.005–1.030)
Urobilinogen, UA: 0.2 mg/dL (ref 0.0–1.0)

## 2019-01-16 LAB — POCT PREGNANCY, URINE: PREG TEST UR: NEGATIVE

## 2019-01-16 MED ORDER — TRIAMCINOLONE ACETONIDE 55 MCG/ACT NA AERO: 2.0000 | INHALATION_SPRAY | Freq: Every day | NASAL | 12 refills | Status: DC

## 2019-01-16 NOTE — ED Triage Notes (Signed)
Patient presents to Urgent Care with complaints of abdominal cramping and overall feeling of fatigue since returning from [facility] 5 days ago. Patient states her cramps are worse after eating.

## 2019-01-16 NOTE — ED Notes (Signed)
Patient ambulatory to bathroom with steady gait at this time to provide urine sample 

## 2019-01-16 NOTE — ED Provider Notes (Signed)
[facility]    CSN: [id number] Arrival date & time: 01/16/19  1907     History   Chief Complaint Chief Complaint  Patient presents with  . Abdominal Pain    HPI Kellie Davila is a 33 y.o. female.   [name] presents with complaints of sinus pressure to face for some time now. States took course of prednisone and amoxicillin. Symptom improved but then returned 1 week ago. Bought allegra today which typically helps but hasn't started it yet. flonase hasn't helped. No fevers. No runny nose, no ear pain. Has taken ibuprofen which hasn't helped. States she also feels like when she eats foot just "sits" in her stomach. Has to strain to pass BM. Last was 2 days ago. Feels nausea at times. Vomited two days ago, hasn't since. No abdominal pain. States she did note small blood in her stool when she did pass it. She saw her PCP about this and was given miralax but hasn't taken. No cough. No shortness of breath. No headache. She feels fatigued.     ROS per HPI, negative if not otherwise mentioned.      Past Medical History:  Diagnosis Date  . Anemia   . Anxiety    worse when on meds  . Vaginal Pap smear, abnormal    did bx, ok since    Patient Active Problem List   Diagnosis Date Noted  . Bacterial vaginitis 07/05/2017  . Abdominal pain affecting pregnancy, antepartum 01/25/2016  . Previous cesarean delivery affecting pregnancy, antepartum 01/25/2016  . 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 Abnormal Pap smear of cervix 12/27/2014    Past Surgical History:  Procedure Laterality Date  . CESAREAN SECTION      OB History    Gravida  3   Para  1   Term  1   Preterm      AB  2   Living  1     SAB  0   TAB  2   Ectopic  0   Multiple  0   Live Births  1            Home Medications    Prior to Admission medications   Medication Sig Start Date End Date Taking? Authorizing Provider  cetirizine-pseudoephedrine (ZYRTEC-D) 5-120 MG tablet Take 1  tablet by mouth daily. 10/08/18   Wurst, [place], PA-C  cyclobenzaprine (FLEXERIL) 10 MG tablet Take 1 tablet (10 mg total) by mouth 2 (two) times daily as needed for muscle spasms. 09/22/18   Dahlia Byes A, NP  meloxicam (MOBIC) 7.5 MG tablet Take 1 tablet (7.5 mg total) by mouth daily. 09/22/18   Bast, Gloris Manchester A, NP  predniSONE (STERAPRED UNI-PAK 21 TAB) 10 MG (21) TBPK tablet Take by mouth daily. Take 6 tabs by mouth daily  for 2 days, then 5 tabs for 2 days, then 4 tabs for 2 days, then 3 tabs for 2 days, 2 tabs for 2 days, then 1 tab by mouth daily for 2 days 11/26/18   Coralyn Mark, NP  promethazine (PHENERGAN) 12.5 MG tablet Take 1-2 tablets (12.5-25 mg total) by mouth every 6 (six) hours as needed for nausea or vomiting. 09/16/18   Cathie Hoops, Amy V, PA-C  tamsulosin (FLOMAX) 0.4 MG CAPS capsule Take 1 capsule (0.4 mg total) by mouth daily. 09/16/18   Cathie Hoops, Amy V, PA-C  triamcinolone (NASACORT) 55 MCG/ACT AERO nasal inhaler Place 2 sprays into the nose daily. 01/16/19   Georgetta Haber, NP  Family History Family History  Problem Relation Age of Onset  . Alcohol abuse Neg Hx   . Arthritis Neg Hx   . Asthma Neg Hx   . Birth defects Neg Hx   . Cancer Neg Hx   . COPD Neg Hx   . Depression Neg Hx   . Diabetes Neg Hx   . Drug abuse Neg Hx   . Early death Neg Hx   . Hearing loss Neg Hx   . Heart disease Neg Hx   . Hyperlipidemia Neg Hx   . Hypertension Neg Hx   . Kidney disease Neg Hx   . Learning disabilities Neg Hx   . Mental illness Neg Hx   . Mental retardation Neg Hx   . Miscarriages / Stillbirths Neg Hx   . Stroke Neg Hx   . Vision loss Neg Hx     Social History Social History   Tobacco Use  . Smoking status: Never Smoker  . Smokeless tobacco: Never Used  Substance Use Topics  . Alcohol use: No  . Drug use: No     Allergies   Compazine [prochlorperazine edisylate] and Reglan [metoclopramide]   Review of Systems Review of Systems   Physical Exam Triage Vital  Signs ED Triage Vitals  Enc Vitals Group     BP 01/16/19 1940 114/65     Pulse Rate 01/16/19 1940 91     Resp --      Temp 01/16/19 1940 98.9 F (37.2 C)     Temp Source 01/16/19 1940 Oral     SpO2 01/16/19 1940 100 %     Weight --      Height --      Head Circumference --      Peak Flow --      Pain Score 01/16/19 1938 0     Pain Loc --      Pain Edu? --      Excl. in GC? --    No data found.  Updated Vital Signs BP 114/65 (BP Location: Left Arm)   Pulse 91   Temp 98.9 F (37.2 C) (Oral)   SpO2 100%    Physical Exam Constitutional:      General: She is not in acute distress.    Appearance: She is well-developed.  HENT:     Head: Normocephalic and atraumatic.     Right Ear: Tympanic membrane, ear canal and external ear normal.     Left Ear: Tympanic membrane, ear canal and external ear normal.     Nose:     Right Sinus: Maxillary sinus tenderness and frontal sinus tenderness present.     Left Sinus: Maxillary sinus tenderness and frontal sinus tenderness present.     Mouth/Throat:     Pharynx: Uvula midline.     Tonsils: No tonsillar exudate.  Eyes:     Conjunctiva/sclera: Conjunctivae normal.     Pupils: Pupils are equal, round, and reactive to light.  Cardiovascular:     Rate and Rhythm: Normal rate and regular rhythm.     Heart sounds: Normal heart sounds.  Pulmonary:     Effort: Pulmonary effort is normal.     Breath sounds: Normal breath sounds.  Abdominal:     Palpations: Abdomen is soft.     Tenderness: There is no abdominal tenderness.  Skin:    General: Skin is warm and dry.  Neurological:     Mental Status: She is alert and oriented to person, place, and time.  UC Treatments / Results  Labs (all labs ordered are listed, but only abnormal results are displayed) Labs Reviewed  POCT URINALYSIS DIP (DEVICE) - Abnormal; Notable for the following components:      Result Value   pH 8.5 (*)    All other components within normal limits  POC  URINE PREG, ED  POCT PREGNANCY, URINE    EKG None  Radiology No results found.  Procedures Procedures (including critical care time)  Medications Ordered in UC Medications - No data to display  Initial Impression / Assessment and Plan / UC Course  I have reviewed the triage vital signs and the nursing notes.  Pertinent labs & imaging results that were available during my care of the patient were reviewed by me and considered in my medical decision making (see chart for details).     Chronic sinusitis per chart review. Will switch to nasacort to see if this is more effective. Discussed miralax, patient hadn't taken as she states she didn't know how. Benign physical exam. Eating and drinking. Return precautions provided. Patient verbalized understanding and agreeable to plan.    Final Clinical Impressions(s) / UC Diagnoses   Final diagnoses:  Constipation, unspecified constipation type  Sinus pressure     Discharge Instructions     Increase fiber, water and activity to help with constipation.  Mix 1 scoopful in 8 oz of water, follow this with at least 2-3 additional 8 oz of water.  Take once a day or as needed to promote large Bm.  Stop flonase and start nasocort.  Daily allegra.  If symptoms worsen or do not improve in the next week to return to be seen or to follow up with your PCP.     ED Prescriptions    Medication Sig Dispense Auth. Provider   triamcinolone (NASACORT) 55 MCG/ACT AERO nasal inhaler Place 2 sprays into the nose daily. 1 Inhaler Georgetta Haber, NP     Controlled Substance Prescriptions [place] Controlled Substance Registry consulted? Not Applicable   Georgetta Haber, NP 01/16/19 2024

## 2019-01-16 NOTE — Discharge Instructions (Signed)
Increase fiber, water and activity to help with constipation.  Mix 1 scoopful in 8 oz of water, follow this with at least 2-3 additional 8 oz of water.  Take once a day or as needed to promote large Bm.  Stop flonase and start nasocort.  Daily allegra.  If symptoms worsen or do not improve in the next week to return to be seen or to follow up with your PCP.

## 2019-02-04 ENCOUNTER — Ambulatory Visit (INDEPENDENT_AMBULATORY_CARE_PROVIDER_SITE_OTHER): Payer: Medicaid Other | Admitting: Allergy

## 2019-02-04 ENCOUNTER — Other Ambulatory Visit: Payer: Self-pay

## 2019-02-04 ENCOUNTER — Encounter: Payer: Self-pay | Admitting: Allergy

## 2019-02-04 VITALS — BP 98/62 | HR 98 | Temp 97.6°F | Resp 16 | Ht 62.0 in | Wt 180.4 lb

## 2019-02-04 DIAGNOSIS — J3089 Other allergic rhinitis: Secondary | ICD-10-CM | POA: Diagnosis not present

## 2019-02-04 DIAGNOSIS — H1013 Acute atopic conjunctivitis, bilateral: Secondary | ICD-10-CM | POA: Diagnosis not present

## 2019-02-04 MED ORDER — TRIAMCINOLONE ACETONIDE 55 MCG/ACT NA AERO: 2.0000 | INHALATION_SPRAY | Freq: Every day | NASAL | 3 refills | Status: DC

## 2019-02-04 MED ORDER — MONTELUKAST SODIUM 10 MG PO TABS: 10.0000 mg | ORAL_TABLET | Freq: Every day | ORAL | 5 refills | Status: DC

## 2019-02-04 MED ORDER — AZELASTINE HCL 0.1 % NA SOLN
2.0000 | Freq: Two times a day (BID) | NASAL | 3 refills | Status: DC
Start: 2019-02-04 — End: 2019-06-17

## 2019-02-04 MED ORDER — OLOPATADINE HCL 0.7 % OP SOLN: 1.0000 [drp] | Freq: Every day | OPHTHALMIC | 3 refills | Status: DC | PRN

## 2019-02-04 MED ORDER — FEXOFENADINE HCL 180 MG PO TABS: 180.0000 mg | ORAL_TABLET | Freq: Every day | ORAL | 3 refills | Status: DC

## 2019-02-04 NOTE — Progress Notes (Signed)
New Patient Note  RE: Kellie Davila MRN: [id number] DOB: Apr 04, 1986 Date of Office Visit: 02/04/2019  Referring provider: Christia Reading, MD Primary care provider: [name], PA-C  Chief Complaint: sinus issues  History of present illness: Kellie Davila is a 33 y.o. female presenting today for consultation for sinus allergy symptoms.  She did see Dr. Jenne Pane with ENT who thought that her sinus symptoms were related to environmental allergies I recommended she have an allergy evaluation.  She states her mother had toxic mold in her home.  Her mother moved out of that home and into the home with patient and her grandparents.  She states she is not sure if her mother brought mold issues into the home she is currently in.  She also states the home she lives in an hour is quite old.   Symptoms include "nasal pressure", ear pain, nose pain, "head feels cloudy", itchy eyes, itchy throat, nasal congestion and drainage with PND, sneezing.  Symptoms have been ongoing since December 2019.  She states prior to this she only would have seasonal itchy eye symptoms.  Since the symptoms started up in December she has been treated twice for presumed sinus infections with antibiotics and 1 of these treatments also included a course of prednisone.  She was also advised to use Flonase which she did use consistently at the beginning but states that it was starting to make her nose hurt worse and she did not feel that it was providing too much benefit that she stopped.  She also states she has been taking Zyrtec which has not helped much.  No history of asthma, eczema or food allergy.  Review of systems: Review of Systems  Constitutional: Negative for chills, fever and malaise/fatigue.  HENT: Positive for congestion, ear pain and sinus pain. Negative for ear discharge, hearing loss, nosebleeds, sore throat and tinnitus.   Eyes: Negative for pain, discharge and redness.  Respiratory: Negative for  cough, shortness of breath and wheezing.   Cardiovascular: Negative for chest pain.  Gastrointestinal: Negative for abdominal pain, constipation, diarrhea, heartburn, nausea and vomiting.  Musculoskeletal: Negative for joint pain.  Skin: Negative for itching and rash.  Neurological: Positive for headaches. Negative for dizziness.    All other systems negative unless noted above in HPI  Past medical history: Past Medical History:  Diagnosis Date  . Anemia   . Anxiety    worse when on meds  . Vaginal Pap smear, abnormal    did bx, ok since    Past surgical history: Past Surgical History:  Procedure Laterality Date  . CESAREAN SECTION      Family history:  Family History  Problem Relation Age of Onset  . Allergic rhinitis Mother   . Alcohol abuse Neg Hx   . Arthritis Neg Hx   . Asthma Neg Hx   . Birth defects Neg Hx   . Cancer Neg Hx   . COPD Neg Hx   . Depression Neg Hx   . Diabetes Neg Hx   . Drug abuse Neg Hx   . Early death Neg Hx   . Hearing loss Neg Hx   . Heart disease Neg Hx   . Hyperlipidemia Neg Hx   . Hypertension Neg Hx   . Kidney disease Neg Hx   . Learning disabilities Neg Hx   . Mental illness Neg Hx   . Mental retardation Neg Hx   . Miscarriages / Stillbirths Neg Hx   .  Stroke Neg Hx   . Vision loss Neg Hx     Social history: She lives in a older home with carpeting with electric heating and central cooling.  No pets in the home.  No concern for roaches in the home.  She is an Public librarianaesthetician.  She denies a smoking history.  Medication List: Allergies as of 02/04/2019      Reactions   Compazine [prochlorperazine Edisylate] Anxiety   Reglan [metoclopramide] Anxiety      Medication List       Accurate as of February 04, 2019 12:32 PM. Always use your most recent med list.        azelastine 0.1 % nasal spray Commonly known as:  ASTELIN Place 2 sprays into both nostrils 2 (two) times daily. Use in each nostril as directed   fexofenadine 180 MG  tablet Commonly known as:  ALLEGRA Take 1 tablet (180 mg total) by mouth daily.   fluticasone 50 MCG/ACT nasal spray Commonly known as:  FLONASE Use 1 squirt to each nostril daily as needed for sinus symptoms   montelukast 10 MG tablet Commonly known as:  SINGULAIR Take 1 tablet (10 mg total) by mouth at bedtime.   Olopatadine HCl 0.7 % Soln Commonly known as:  Pazeo Place 1 drop into both eyes daily as needed.   triamcinolone 55 MCG/ACT Aero nasal inhaler Commonly known as:  NASACORT Place 2 sprays into the nose daily.       Known medication allergies: Allergies  Allergen Reactions  . Compazine [Prochlorperazine Edisylate] Anxiety  . Reglan [Metoclopramide] Anxiety     Physical examination: Blood pressure 98/62, pulse 98, temperature 97.6 F (36.4 C), temperature source Tympanic, resp. rate 16, height 5\' 2"  (1.575 m), weight 180 lb 6.4 oz (81.8 kg).  General: Alert, interactive, in no acute distress. HEENT: PERRLA, TMs pearly gray, turbinates moderately edematous with clear discharge, post-pharynx non erythematous. Neck: Supple without lymphadenopathy. Lungs: Clear to auscultation without wheezing, rhonchi or rales. {no increased work of breathing. CV: Normal S1, S2 without murmurs. Abdomen: Nondistended, nontender. Skin: Warm and dry, without lesions or rashes. Extremities:  No clubbing, cyanosis or edema. Neuro:   Grossly intact.  Diagnositics/Labs:  Allergy testing: Environmental allergy skin prick testing is positive to rough pigweed and Rhizopus. Intradermal testing is negative. Allergy testing results were read and interpreted by provider, documented by clinical staff.   Assessment and plan: Patient Instructions  Allergic rhinitis with conjunctivitis - environmental allergy skin testing today is positive to rough pigweed (weed pollen) and rhizopus oryzae (mold) - will obtain environmental allergy panel via blood work to ensure you are not sensitive to  other allergens - allergen avoidance measures discussed/handouts provided - try Allegra 180mg  1 tablet daily - recommend starting Singulair 10mg  daily - take at bedtime - for nasal congestion use Nasacort 2 sprays each nostril daily for 1-2 weeks at a time for maximum effect - for nasal drainage/post-nasal drip use Astelin 2 sprays each nostril twice a day - for itchy/watery/red eyes use Pazeo 1 drop each eye daily as needed - allergen immunotherapy discussed today including protocol, benefits and risk.  Informational handout provided.  If interested in this therapuetic option you can check with your insurance carrier for coverage.  Let us know if you would like to proceed with this option.    Follow-up 3-4 months or sooner if needed  I appreciate the opportunity to take part in Yudit's care. Please do not hesitate to contact me with questions.  Sincerely,   Prudy Feeler, MD Allergy/Immunology [facility]

## 2019-02-04 NOTE — Patient Instructions (Addendum)
Allergic rhinitis with conjunctivitis - environmental allergy skin testing today is positive to rough pigweed (weed pollen) and rhizopus oryzae (mold) - will obtain environmental allergy panel via blood work to ensure you are not sensitive to other allergens - allergen avoidance measures discussed/handouts provided - try Allegra 180mg  1 tablet daily - recommend starting Singulair 10mg  daily - take at bedtime - for nasal congestion use Nasacort 2 sprays each nostril daily for 1-2 weeks at a time for maximum effect - for nasal drainage/post-nasal drip use Astelin 2 sprays each nostril twice a day - for itchy/watery/red eyes use Pazeo 1 drop each eye daily as needed - allergen immunotherapy discussed today including protocol, benefits and risk.  Informational handout provided.  If interested in this therapuetic option you can check with your insurance carrier for coverage.  Let us know if you would like to proceed with this option.    Follow-up 3-4 months or sooner if needed

## 2019-02-06 LAB — ALLERGENS, ZONE 2

## 2019-04-15 ENCOUNTER — Encounter (HOSPITAL_COMMUNITY): Payer: Self-pay | Admitting: Emergency Medicine

## 2019-04-15 ENCOUNTER — Ambulatory Visit (INDEPENDENT_AMBULATORY_CARE_PROVIDER_SITE_OTHER): Payer: Medicaid Other

## 2019-04-15 ENCOUNTER — Other Ambulatory Visit: Payer: Self-pay

## 2019-04-15 ENCOUNTER — Ambulatory Visit (HOSPITAL_COMMUNITY)
Admission: EM | Admit: 2019-04-15 | Discharge: 2019-04-15 | Disposition: A | Discharge status: Home / Self Care | Payer: Medicaid Other | Attending: Internal Medicine | Admitting: Internal Medicine

## 2019-04-15 DIAGNOSIS — R079 Chest pain, unspecified: Secondary | ICD-10-CM

## 2019-04-15 DIAGNOSIS — J321 Chronic frontal sinusitis: Secondary | ICD-10-CM

## 2019-04-15 DIAGNOSIS — R0602 Shortness of breath: Secondary | ICD-10-CM | POA: Diagnosis not present

## 2019-04-15 MED ORDER — PREDNISONE 50 MG PO TABS: 50.0000 mg | ORAL_TABLET | Freq: Every day | ORAL | 0 refills | Status: AC

## 2019-04-15 MED ORDER — CYCLOBENZAPRINE HCL 5 MG PO TABS: 5.0000 mg | ORAL_TABLET | Freq: Two times a day (BID) | ORAL | 0 refills | Status: DC | PRN

## 2019-04-15 MED ORDER — SPACER/AERO-HOLDING CHAMBERS DEVI: 1.0000 | Freq: Once | 0 refills | Status: AC

## 2019-04-15 MED ORDER — ALBUTEROL SULFATE HFA 108 (90 BASE) MCG/ACT IN AERS: 1.0000 | INHALATION_SPRAY | Freq: Four times a day (QID) | RESPIRATORY_TRACT | 0 refills | Status: DC | PRN

## 2019-04-15 NOTE — ED Triage Notes (Signed)
Pt sts pain in right side of her neck and chest; pt sts worse with movement and inspiration; pt sts moving to new location today but denies obvious injury

## 2019-04-15 NOTE — Discharge Instructions (Addendum)
Please pick up prescription for other nasal spray to see if this further helps with sinus problems/nasal swelling Please begin prednisone daily with food for the next 5 days.  Take in the morning if you are able The prednisone should also help with muscular pain, after finishing course of prednisone you may take ibuprofen and Tylenol, no more than 800 mg of ibuprofen every 8 hours with food paired with [id number] mg Tylenol every 4-6 hours Please follow-up with ENT/allergist for further management of sinus problems Follow-up here in emergency room if developing worsening chest discomfort or shortness of breath

## 2019-04-16 NOTE — ED Provider Notes (Signed)
[facility]    CSN: [id number] Arrival date & time: 04/15/19  1851      History   Chief Complaint Chief Complaint  Patient presents with  . Generalized Body Aches    HPI Kellie Davila is a 33 y.o. female history of chronic sinusitis presenting today for evaluation of sinus symptoms as well as concern over shortness of breath.  Patient states that she has had persistent issues with her sinuses over the past few months.  She has been seen multiple times with various antibiotics but continues to have congestion and pressure throughout her sinuses.  She has difficulty breathing due to swelling related to this.  Over the past couple days she has felt increased shortness of breath and difficulty breathing and taking a full deep breath.  She is concerned about COVID, but also concerned about mold exposure.  Patient states that she has been living in a house with possible mold.  She is had a cough which is mainly notable at nighttime.  Denies fevers chills or body aches.  She also notes that she was moving today and has developed some back pain.  HPI  Past Medical History:  Diagnosis Date  . Anemia   . Anxiety    worse when on meds  . Vaginal Pap smear, abnormal    did bx, ok since    Patient Active Problem List   Diagnosis Date Noted  . Bacterial vaginitis 07/05/2017  . Abdominal pain affecting pregnancy, antepartum 01/25/2016  . Previous cesarean delivery affecting pregnancy, antepartum 01/25/2016  . 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 Abnormal Pap smear of cervix 12/27/2014    Past Surgical History:  Procedure Laterality Date  . CESAREAN SECTION      OB History    Gravida  3   Para  1   Term  1   Preterm      AB  2   Living  1     SAB  0   TAB  2   Ectopic  0   Multiple  0   Live Births  1            Home Medications    Prior to Admission medications   Medication Sig Start Date End Date Taking? Authorizing Provider  albuterol (VENTOLIN  HFA) 108 (90 Base) MCG/ACT inhaler Inhale 1-2 puffs into the lungs every 6 (six) hours as needed for wheezing or shortness of breath. 04/15/19   ,  C, PA-C  azelastine (ASTELIN) 0.1 % nasal spray Place 2 sprays into both nostrils 2 (two) times daily. Use in each nostril as directed 02/04/19   Marcelyn BruinsPadgett, Shaylar Patricia, MD  cyclobenzaprine (FLEXERIL) 5 MG tablet Take 1-2 tablets (5-10 mg total) by mouth 2 (two) times daily as needed for muscle spasms. 04/15/19   ,  C, PA-C  fexofenadine (ALLEGRA) 180 MG tablet Take 1 tablet (180 mg total) by mouth daily. 02/04/19   Marcelyn BruinsPadgett, Shaylar Patricia, MD  fluticasone Aleda Grana(FLONASE) 50 MCG/ACT nasal spray Use 1 squirt to each nostril daily as needed for sinus symptoms 01/14/19   [provider]  montelukast (SINGULAIR) 10 MG tablet Take 1 tablet (10 mg total) by mouth at bedtime. 02/04/19   Marcelyn BruinsPadgett, Shaylar Patricia, MD  Olopatadine HCl (PAZEO) 0.7 % SOLN Place 1 drop into both eyes daily as needed. 02/04/19   Marcelyn BruinsPadgett, Shaylar Patricia, MD  predniSONE (DELTASONE) 50 MG tablet Take 1 tablet (50 mg total) by mouth daily for 5 days. 04/15/19 04/20/19  ,  C, PA-C  triamcinolone (NASACORT) 55 MCG/ACT AERO nasal inhaler Place 2 sprays into the nose daily. 02/04/19   Marcelyn BruinsPadgett, Shaylar Patricia, MD    Family History Family History  Problem Relation Age of Onset  . Allergic rhinitis Mother   . Alcohol abuse Neg Hx   . Arthritis Neg Hx   . Asthma Neg Hx   . Birth defects Neg Hx   . Cancer Neg Hx   . COPD Neg Hx   . Depression Neg Hx   . Diabetes Neg Hx   . Drug abuse Neg Hx   . Early death Neg Hx   . Hearing loss Neg Hx   . Heart disease Neg Hx   . Hyperlipidemia Neg Hx   . Hypertension Neg Hx   . Kidney disease Neg Hx   . Learning disabilities Neg Hx   . Mental illness Neg Hx   . Mental retardation Neg Hx   . Miscarriages / Stillbirths Neg Hx   . Stroke Neg Hx   . Vision loss Neg Hx     Social History Social History    Tobacco Use  . Smoking status: Never Smoker  . Smokeless tobacco: Never Used  Substance Use Topics  . Alcohol use: No  . Drug use: No     Allergies   Compazine [prochlorperazine edisylate] and Reglan [metoclopramide]   Review of Systems Review of Systems  Constitutional: Negative for activity change, appetite change, chills, fatigue and fever.  HENT: Positive for congestion, rhinorrhea and sinus pressure. Negative for ear pain, sore throat and trouble swallowing.   Eyes: Negative for discharge and redness.  Respiratory: Positive for cough, chest tightness and shortness of breath.   Cardiovascular: Negative for chest pain.  Gastrointestinal: Negative for abdominal pain, diarrhea, nausea and vomiting.  Musculoskeletal: Negative for myalgias.  Skin: Negative for rash.  Neurological: Negative for dizziness, light-headedness and headaches.     Physical Exam Triage Vital Signs ED Triage Vitals  Enc Vitals Group     BP 04/15/19 1903 111/71     Pulse Rate 04/15/19 1903 83     Resp 04/15/19 1903 18     Temp 04/15/19 1903 98.4 F (36.9 C)     Temp Source 04/15/19 1903 Oral     SpO2 04/15/19 1903 96 %     Weight --      Height --      Head Circumference --      Peak Flow --      Pain Score 04/15/19 1904 6     Pain Loc --      Pain Edu? --      Excl. in GC? --    No data found.  Updated Vital Signs BP 111/71 (BP Location: Left Arm)   Pulse 83   Temp 98.4 F (36.9 C) (Oral)   Resp 18   SpO2 96%   Visual Acuity Right Eye Distance:   Left Eye Distance:   Bilateral Distance:    Right Eye Near:   Left Eye Near:    Bilateral Near:     Physical Exam Vitals signs and nursing note reviewed.  Constitutional:      General: She is not in acute distress.    Appearance: She is well-developed.  HENT:     Head: Normocephalic and atraumatic.     Ears:     Comments: Bilateral ears without tenderness to palpation of external auricle, tragus and mastoid, EAC's without  erythema or swelling, TM's  with good bony landmarks and cone of light. Non erythematous.    Nose:     Comments: nasal mucosa erythematous, bilateral swollen turbinates    Mouth/Throat:     Comments: Oral mucosa pink and moist, no tonsillar enlargement or exudate. Posterior pharynx patent and nonerythematous, no uvula deviation or swelling. Normal phonation. Eyes:     Conjunctiva/sclera: Conjunctivae normal.  Neck:     Musculoskeletal: Neck supple.  Cardiovascular:     Rate and Rhythm: Normal rate and regular rhythm.     Heart sounds: No murmur.  Pulmonary:     Effort: Pulmonary effort is normal. No respiratory distress.     Breath sounds: Normal breath sounds.     Comments: Breathing comfortably at rest, CTABL, no wheezing, rales or other adventitious sounds auscultated  Anterior chest diffusely tender to palpation Abdominal:     Palpations: Abdomen is soft.     Tenderness: There is no abdominal tenderness.  Musculoskeletal:     Comments: Right trapezius with tenderness to palpation, nontender to cervical spine and thoracic spine midline Full active range of motion of neck Full active range of motion of upper extremities  Skin:    General: Skin is warm and dry.  Neurological:     Mental Status: She is alert.      UC Treatments / Results  Labs (all labs ordered are listed, but only abnormal results are displayed) Labs Reviewed - No data to display  EKG None  Radiology Dg Chest 2 View  Result Date: 04/15/2019 CLINICAL DATA:  Shortness of breath, chest pain EXAM: CHEST - 2 VIEW COMPARISON:  09/18/2017 FINDINGS: Heart and mediastinal contours are within normal limits. No focal opacities or effusions. No acute bony abnormality. IMPRESSION: No active cardiopulmonary disease. Electronically Signed   By: Rolm Baptise M.D.   On: 04/15/2019 20:06    Procedures Procedures (including critical care time)  Medications Ordered in UC Medications - No data to display  Initial  Impression / Assessment and Plan / UC Course  I have reviewed the triage vital signs and the nursing notes.  Pertinent labs & imaging results that were available during my care of the patient were reviewed by me and considered in my medical decision making (see chart for details).     Chest x-ray normal.  Patient with chronic sinusitis, has not been on any recent steroids, will try this to help with symptoms as well as recommending to pick up other nasal spray which she was previously prescribed which she has not began. Patient also appears to have muscular pain in chest as well as in neck/back.  Prednisone should help with this, but also provided Flexeril to supplement.  May use NSAIDs after completion of course of prednisone.  Provided albuterol inhaler to use as needed for shortness of breath.  Follow-up with ENT/allergist if sinus issues persisting.  Will set up COVID testing.Discussed strict return precautions. Patient verbalized understanding and is agreeable with plan.  Final Clinical Impressions(s) / UC Diagnoses   Final diagnoses:  Chronic frontal sinusitis  Shortness of breath     Discharge Instructions     Please pick up prescription for other nasal spray to see if this further helps with sinus problems/nasal swelling Please begin prednisone daily with food for the next 5 days.  Take in the morning if you are able The prednisone should also help with muscular pain, after finishing course of prednisone you may take ibuprofen and Tylenol, no more than 800 mg of ibuprofen  every 8 hours with food paired with [phone] mg Tylenol every 4-6 hours Please follow-up with ENT/allergist for further management of sinus problems Follow-up here in emergency room if developing worsening chest discomfort or shortness of breath    ED Prescriptions    Medication Sig Dispense Auth. Provider   predniSONE (DELTASONE) 50 MG tablet Take 1 tablet (50 mg total) by mouth daily for 5 days. 5 tablet  ,  C, PA-C   cyclobenzaprine (FLEXERIL) 5 MG tablet Take 1-2 tablets (5-10 mg total) by mouth 2 (two) times daily as needed for muscle spasms. 24 tablet ,  C, PA-C   albuterol (VENTOLIN HFA) 108 (90 Base) MCG/ACT inhaler Inhale 1-2 puffs into the lungs every 6 (six) hours as needed for wheezing or shortness of breath. 1 Inhaler , [place] C, PA-C   Spacer/Aero-Holding Chambers DEVI 1 applicator by Does not apply route once for 1 dose. 1 each , Junius Creamer C, PA-C     Controlled Substance Prescriptions [place] Controlled Substance Registry consulted? Not Applicable   Lew Dawes,  C, [place]PA-C 04/16/19 1746

## 2019-04-17 ENCOUNTER — Telehealth: Payer: Self-pay | Admitting: *Deleted

## 2019-04-17 DIAGNOSIS — Z20822 Contact with and (suspected) exposure to covid-19: Secondary | ICD-10-CM

## 2019-04-17 NOTE — Telephone Encounter (Signed)
-----   Message from Janith Lima, PA-C sent at 04/16/2019  5:18 PM EDT ----- Regarding: Needs COVID testing URI symptoms, SOB over the past few days. No known exposure

## 2019-04-17 NOTE — Telephone Encounter (Signed)
Patient scheduled for covid testing on Monday 04/20/19. Unable to go today. Instructions given and order placed

## 2019-04-20 ENCOUNTER — Other Ambulatory Visit: Payer: Self-pay

## 2019-04-21 ENCOUNTER — Other Ambulatory Visit: Payer: Self-pay

## 2019-06-04 ENCOUNTER — Ambulatory Visit: Payer: Medicaid Other | Admitting: Allergy

## 2019-06-17 ENCOUNTER — Encounter: Payer: Self-pay | Admitting: Allergy

## 2019-06-17 ENCOUNTER — Other Ambulatory Visit: Payer: Self-pay

## 2019-06-17 ENCOUNTER — Ambulatory Visit (INDEPENDENT_AMBULATORY_CARE_PROVIDER_SITE_OTHER): Payer: Medicaid Other | Admitting: Allergy

## 2019-06-17 VITALS — HR 66 | Temp 98.2°F | Resp 18

## 2019-06-17 DIAGNOSIS — J3089 Other allergic rhinitis: Secondary | ICD-10-CM | POA: Diagnosis not present

## 2019-06-17 DIAGNOSIS — R0602 Shortness of breath: Secondary | ICD-10-CM | POA: Diagnosis not present

## 2019-06-17 DIAGNOSIS — H1013 Acute atopic conjunctivitis, bilateral: Secondary | ICD-10-CM

## 2019-06-17 MED ORDER — FLOVENT HFA 44 MCG/ACT IN AERO: 2.0000 | INHALATION_SPRAY | Freq: Two times a day (BID) | RESPIRATORY_TRACT | 5 refills | Status: DC

## 2019-06-17 MED ORDER — XHANCE 93 MCG/ACT NA EXHU: 2.0000 | INHALANT_SUSPENSION | Freq: Two times a day (BID) | NASAL | 6 refills | Status: DC

## 2019-06-17 MED ORDER — LEVOCETIRIZINE DIHYDROCHLORIDE 5 MG PO TABS: 5.0000 mg | ORAL_TABLET | Freq: Every evening | ORAL | 5 refills | Status: DC

## 2019-06-17 MED ORDER — AZELASTINE HCL 0.1 % NA SOLN: 2.0000 | Freq: Two times a day (BID) | NASAL | 3 refills | Status: DC

## 2019-06-17 NOTE — Patient Instructions (Addendum)
Allergic rhinitis with conjunctivitis - continue avoidance measures for weed pollen, mold and dust mite.  Provided with avoidance measures - resume use of Xyzal '5mg'$  daily at this time - recommend performing nasal rinse daily (provided with rinse kit today).  This helps to flush/clean the nose prior to use of your medicated nasal sprays.   - for nasal congestion, sinus pressure and headache relief use Xhance nasal spray device.  Kellie Davila has fluticasone (same medication in Flonase) at a stronger strength and the device allows for deeper deposition of the medicated spray into the sinuses to have a better effect.  Use Xhance 2 sprays each nostril twice a day for nasal congestion.    - for nasal drainage/post-nasal drip use Astelin 2 sprays each nostril twice a day - for itchy/watery/red eyes use Pazeo 1 drop each eye daily as needed - consider allergen immunotherapy (allergy shots) if medication regimen is not effective in controlling symptoms  Chest tightness and Shortness of breath - have access to albuterol inhaler 2 puffs every 4-6 hours as needed for cough/wheeze/shortness of breath/chest tightness.  May use 15-20 minutes prior to activity.   Monitor frequency of use. - at this time for improved control recommend taking Flovent 68mg 2 puffs twice a day at this time until symptoms resolve    Follow-up 3-4 months or sooner if needed

## 2019-06-17 NOTE — Progress Notes (Signed)
Follow-up Note  RE: Kellie Davila MRN: [id number] DOB: 08/26/1986 Date of Office Visit: 06/17/2019   History of present illness: Kellie Davila is a 33 y.o. female presenting today for follow-up of allergic rhinitis with conjunctivitis.  She was last seen in the office on 02/04/2019 by myself.   She states about a month ago she put hair extensions in her hair that had been sitting in a bag in her previous home that had mold.  She is quite concerned about having had mold exposure from the extensions and that mold spores may be in her hair still.  She has since moved from this home.  She states she took the hair out a week ago and has been washing her hair quite frequently.  However because of this concern she feels that he has triggered return of allergy symptoms.     She states after her last visit she did have improvement in her allergy symptoms with use of singulair, zyrtec and astelin.   However now with the potential mold exposure she has had return of nasal congestion with PND with cough, frontal HA.  She also states she has been having chest tightness and SOB which is new for her.  She did go to [facility] about a month ago and she did have Covid testing which was negative.  They did prescribe her an albuterol inhaler which she states helps and has been using several times a week before this week which she has used daily.   She ran out of singulair however she states it cause her to have increased anxiety.    She states zyrtec works "okActor does not help.  Xyzal has been most effective antihistamine for her.   Nasacort she states does not help congestion.  astelin does help with nasal drainage but she ran out of this months ago.    Review of systems: Review of Systems  Constitutional: Negative for chills, fever and malaise/fatigue.  HENT: Positive for congestion, sinus pain and sore throat. Negative for ear discharge and nosebleeds.   Eyes: Negative for pain, discharge and  redness.  Respiratory: Positive for cough and shortness of breath.   Cardiovascular: Negative.   Gastrointestinal: Negative.   Musculoskeletal: Negative.   Skin: Negative for itching and rash.  Neurological: Positive for headaches.    All other systems negative unless noted above in HPI  Past medical/social/surgical/family history have been reviewed and are unchanged unless specifically indicated below.  No changes  Medication List: Allergies as of 06/17/2019      Reactions   Compazine [prochlorperazine Edisylate] Anxiety   Reglan [metoclopramide] Anxiety      Medication List       Accurate as of June 17, 2019  4:28 PM. If you have any questions, ask your nurse or doctor.        STOP taking these medications   fexofenadine 180 MG tablet Commonly known as: ALLEGRA Stopped by:  Charmian Muff, MD   fluticasone 50 MCG/ACT nasal spray Commonly known as: FLONASE Replaced by: Truett Perna 93 MCG/ACT Exhu Stopped by:  Charmian Muff, MD   montelukast 10 MG tablet Commonly known as: SINGULAIR Stopped by:  Charmian Muff, MD     TAKE these medications   albuterol 108 (90 Base) MCG/ACT inhaler Commonly known as: VENTOLIN HFA Inhale 1-2 puffs into the lungs every 6 (six) hours as needed for wheezing or shortness of breath.   azelastine 0.1 % nasal spray Commonly known as: ASTELIN Place  2 sprays into both nostrils 2 (two) times daily. Use in each nostril as directed   cetirizine 10 MG tablet Commonly known as: ZYRTEC Take 10 mg by mouth daily.   cyclobenzaprine 5 MG tablet Commonly known as: FLEXERIL Take 1-2 tablets (5-10 mg total) by mouth 2 (two) times daily as needed for muscle spasms.   Flovent HFA 44 MCG/ACT inhaler Generic drug: fluticasone Inhale 2 puffs into the lungs 2 (two) times daily. Started by:  Charmian Muff, MD   levocetirizine 5 MG tablet Commonly known as: Xyzal Take 1 tablet (5 mg total) by mouth every  evening. Started by:  Charmian Muff, MD   Olopatadine HCl 0.7 % Soln Commonly known as: Pazeo Place 1 drop into both eyes daily as needed.   triamcinolone 55 MCG/ACT Aero nasal inhaler Commonly known as: NASACORT Place 2 sprays into the nose daily.   Xhance 93 MCG/ACT Exhu Generic drug: Fluticasone Propionate Place 2 sprays into both nostrils 2 (two) times daily. Replaces: fluticasone 50 MCG/ACT nasal spray Started by:  Charmian Muff, MD       Known medication allergies: Allergies  Allergen Reactions  . Compazine [Prochlorperazine Edisylate] Anxiety  . Reglan [Metoclopramide] Anxiety     Physical examination: Pulse 66, temperature 98.2 F (36.8 C), temperature source Temporal, resp. rate 18.  General: Alert, interactive, in no acute distress. HEENT: PERRLA, TMs pearly gray, turbinates mildly edematous with clear discharge, post-pharynx non erythematous. Neck: Supple without lymphadenopathy. Lungs: Clear to auscultation without wheezing, rhonchi or rales. {no increased work of breathing. CV: Normal S1, S2 without murmurs. Abdomen: Nondistended, nontender. Skin: Warm and dry, without lesions or rashes. Extremities:  No clubbing, cyanosis or edema. Neuro:   Grossly intact.  Diagnositics/Labs: Labs:  Component     Latest Ref Rng & Units 02/04/2019  D Pteronyssinus IgE     Class 0 kU/L <0.10  D Farinae IgE     Class 0/I kU/L 0.10 (A)  Cat Dander IgE     Class 0 kU/L <0.10  Dog Dander IgE     Class 0 kU/L <0.10  [place] Grass IgE     Class 0 kU/L <0.10  Timothy Grass IgE     Class 0 kU/L <0.10  Johnson Grass IgE     Class 0 kU/L <0.10  Bahia Grass IgE     Class 0 kU/L <0.10  Cockroach, American IgE     Class 0 kU/L <0.10  Penicillium Chrysogen IgE     Class 0 kU/L <0.10  Cladosporium Herbarum IgE     Class 0 kU/L <0.10  Aspergillus Fumigatus IgE     Class 0 kU/L <0.10  Mucor Racemosus IgE     Class 0 kU/L <0.10  Alternaria Alternata  IgE     Class 0 kU/L <0.10  Stemphylium Herbarum IgE     Class 0 kU/L <0.10  Common Silver Wendee Copp IgE     Class 0 kU/L <0.10  Oak, White IgE     Class 0 kU/L <0.10  Elm, American IgE     Class 0 kU/L <0.10  Maple/Box Elder IgE     Class 0 kU/L <0.10  Hickory, White IgE     Class 0 kU/L <0.10  Amer Sycamore IgE Qn     Class 0 kU/L <0.10  White Mulberry IgE     Class 0 kU/L <0.10  Sweet gum IgE RAST Ql     Class 0 kU/L <0.10  [place], [place] IgE     Class  0 kU/L <0.10  Ragweed, Short IgE     Class 0 kU/L <0.10  Mugwort IgE Qn     Class 0 kU/L <0.10  Plantain, English IgE     Class 0 kU/L <0.10  Pigweed, Rough IgE     Class 0 kU/L <0.10  Sheep Sorrel IgE Qn     Class 0 kU/L <0.10  Nettle IgE     Class 0 kU/L <0.10    Spirometry: FEV1 2.33L 92%, FVC 2.57L 87%. Nonobstructive pattern  Assessment and plan:   Allergic rhinitis with conjunctivitis - continue avoidance measures for weed pollen, mold and dust mite.  Provided with avoidance measures - resume use of Xyzal '5mg'$  daily at this time - recommend performing nasal rinse daily (provided with rinse kit today).  This helps to flush/clean the nose prior to use of your medicated nasal sprays.   - for nasal congestion, sinus pressure and headache relief use Xhance nasal spray device.  Truett Perna has fluticasone (same medication in Flonase) at a stronger strength and the device allows for deeper deposition of the medicated spray into the sinuses to have a better effect.  Use Xhance 2 sprays each nostril twice a day for nasal congestion.    - for nasal drainage/post-nasal drip use Astelin 2 sprays each nostril twice a day - for itchy/watery/red eyes use Pazeo 1 drop each eye daily as needed - consider allergen immunotherapy (allergy shots) if medication regimen is not effective in controlling symptoms  Chest tightness and Shortness of breath - have access to albuterol inhaler 2 puffs every 4-6 hours as needed for  cough/wheeze/shortness of breath/chest tightness.  May use 15-20 minutes prior to activity.   Monitor frequency of use. - at this time for improved control recommend taking Flovent 81mg 2 puffs twice a day at this time until symptoms resolve    Follow-up 3-4 months or sooner if needed  I appreciate the opportunity to take part in Calyssa's care. Please do not hesitate to contact me with questions.  Sincerely,   SPrudy Feeler MD Allergy/Immunology [facility]

## 2019-06-27 ENCOUNTER — Encounter (HOSPITAL_COMMUNITY): Payer: Self-pay

## 2019-06-27 ENCOUNTER — Ambulatory Visit (HOSPITAL_COMMUNITY)
Admission: EM | Admit: 2019-06-27 | Discharge: 2019-06-27 | Disposition: A | Discharge status: Home / Self Care | Payer: Medicaid Other | Attending: Family Medicine | Admitting: Family Medicine

## 2019-06-27 ENCOUNTER — Other Ambulatory Visit: Payer: Self-pay

## 2019-06-27 DIAGNOSIS — M545 Low back pain, unspecified: Secondary | ICD-10-CM

## 2019-06-27 DIAGNOSIS — R110 Nausea: Secondary | ICD-10-CM | POA: Diagnosis not present

## 2019-06-27 LAB — POCT URINALYSIS DIP (DEVICE)
Bilirubin Urine: NEGATIVE
Glucose, UA: NEGATIVE mg/dL
Hgb urine dipstick: NEGATIVE
Ketones, ur: NEGATIVE mg/dL
Leukocytes,Ua: NEGATIVE
Nitrite: NEGATIVE
Protein, ur: NEGATIVE mg/dL
Specific Gravity, Urine: >=1.03 (ref 1.005–1.030)
Urobilinogen, UA: 0.2 mg/dL (ref 0.0–1.0)
pH: 6 (ref 5.0–8.0)

## 2019-06-27 MED ORDER — PROMETHAZINE HCL 25 MG PO TABS: 25.0000 mg | ORAL_TABLET | Freq: Four times a day (QID) | ORAL | 0 refills | Status: DC | PRN

## 2019-06-27 MED ORDER — DICLOFENAC SODIUM 75 MG PO TBEC: 75.0000 mg | DELAYED_RELEASE_TABLET | Freq: Two times a day (BID) | ORAL | 0 refills | Status: DC

## 2019-06-27 NOTE — Discharge Instructions (Addendum)

## 2019-06-27 NOTE — ED Triage Notes (Signed)
Pt present lower back and right side pain, pt also states she has been going to the bathroom more frequently then normal.

## 2019-06-29 NOTE — ED Provider Notes (Signed)
[facility]   [id number] 06/27/19 Arrival Time: 1706  ASSESSMENT & PLAN:  1. Acute right-sided low back pain without sciatica   2. Nausea without vomiting - mild    Able to ambulate here and hemodynamically stable. No indication for imaging of back at this time given no trauma and normal neurological exam. Discussed.  Meds ordered this encounter  Medications  . diclofenac (VOLTAREN) 75 MG EC tablet    Sig: Take 1 tablet (75 mg total) by mouth 2 (two) times daily.    Dispense:  14 tablet    Refill:  0  . promethazine (PHENERGAN) 25 MG tablet    Sig: Take 1 tablet (25 mg total) by mouth every 6 (six) hours as needed for nausea or vomiting.    Dispense:  12 tablet    Refill:  0   Declines UPT. Patient's last menstrual period was 06/06/2019. Reports that she is not sexually active within the past year. Unsure what is causing her mild nausea.  Encourage ROM/movement as tolerated.  Recommend: Follow-up Information    Kellie HawksGordon, Sarah B, PA-C.   Specialty: Physician Assistant Why: As needed. Contact information: [address] [place] [id number] [phone]          Reviewed expectations re: course of current medical issues. Questions answered. Outlined signs and symptoms indicating need for more acute intervention. Patient verbalized understanding. After Visit Summary given.   SUBJECTIVE: History from: patient.  Kellie Davila is a 33 y.o. female who presents with complaint of intermittent right sided lower back discomfort. Onset gradual, over the past several days. Injury/trama: no. History of back problems: rare. Discomfort described as aching without radiation. Pain is unchanged with prolonged walking/standing, unchanged with movements involving back, and slightly better with rest. Progressive LE weakness or saddle anesthesia: none. Extremity sensation changes or weakness: none. Ambulatory without difficulty. Normal bowel/bladder  habits: yes, but does question slight urinary frequency over the past few days without dysuria; without urinary retention. No associated abdominal pain/n/v. Self treatment: has tried nothing for pain relief.  Reports no chronic steroid use, fevers, IV drug use, or recent back surgeries or procedures.  ROS: As per HPI. All other systems negative.   OBJECTIVE:  Vitals:   06/27/19 1725  BP: 104/65  Pulse: 91  Resp: 16  Temp: 98.2 F (36.8 C)  TempSrc: Oral  SpO2: 100%    General appearance: alert; no distress Neck: supple with FROM; without midline tenderness CV: RRR Lungs: unlabored respirations; symmetrical air entry Abdomen: soft, non-tender; non-distended Back: mild to moderate right sided tenderness of her lower lateral paraspinal musculature and over SI joint; FROM at waist; bruising: none; without midline tenderness Extremities: no edema; symmetrical with no gross deformities; normal ROM of bilateral lower extremities Skin: warm and dry Neurologic: normal gait; normal reflexes of RLE and LLE; normal sensation of RLE and LLE; normal strength of RLE and LLE Psychological: alert and cooperative; normal mood and affect  Labs: Results for orders placed or performed during the hospital encounter of 06/27/19  POCT urinalysis dip (device)  Result Value Ref Range   Glucose, UA NEGATIVE NEGATIVE mg/dL   Bilirubin Urine NEGATIVE NEGATIVE   Ketones, ur NEGATIVE NEGATIVE mg/dL   Specific Gravity, Urine >=1.030 1.005 - 1.030   Hgb urine dipstick NEGATIVE NEGATIVE   pH 6.0 5.0 - 8.0   Protein, ur NEGATIVE NEGATIVE mg/dL   Urobilinogen, UA 0.2 0.0 - 1.0 mg/dL   Nitrite NEGATIVE NEGATIVE   Leukocytes,Ua  NEGATIVE NEGATIVE   Labs Reviewed  POCT URINALYSIS DIP (DEVICE)     Allergies  Allergen Reactions  . Compazine [Prochlorperazine Edisylate] Anxiety  . Reglan [Metoclopramide] Anxiety    Past Medical History:  Diagnosis Date  . Anemia   . Anxiety    worse when on meds  .  Vaginal Pap smear, abnormal    did bx, ok since   Social History   Socioeconomic History  . Marital status: Single    Spouse name: Not on file  . Number of children: Not on file  . Years of education: Not on file  . Highest education level: Not on file  Occupational History  . Not on file  Social Needs  . Financial resource strain: Not on file  . Food insecurity    Worry: Not on file    Inability: Not on file  . Transportation needs    Medical: Not on file    Non-medical: Not on file  Tobacco Use  . Smoking status: Never Smoker  . Smokeless tobacco: Never Used  Substance and Sexual Activity  . Alcohol use: No  . Drug use: No  . Sexual activity: Not Currently    Birth control/protection: Abstinence  Lifestyle  . Physical activity    Days per week: Not on file    Minutes per session: Not on file  . Stress: Not on file  Relationships  . Social Herbalist on phone: Not on file    Gets together: Not on file    Attends religious service: Not on file    Active member of club or organization: Not on file    Attends meetings of clubs or organizations: Not on file    Relationship status: Not on file  . Intimate partner violence    Fear of current or ex partner: Not on file    Emotionally abused: Not on file    Physically abused: Not on file    Forced sexual activity: Not on file  Other Topics Concern  . Not on file  Social History Narrative  . Not on file   Family History  Problem Relation Age of Onset  . Allergic rhinitis Mother   . Alcohol abuse Neg Hx   . Arthritis Neg Hx   . Asthma Neg Hx   . Birth defects Neg Hx   . Cancer Neg Hx   . COPD Neg Hx   . Depression Neg Hx   . Diabetes Neg Hx   . Drug abuse Neg Hx   . Early death Neg Hx   . Hearing loss Neg Hx   . Heart disease Neg Hx   . Hyperlipidemia Neg Hx   . Hypertension Neg Hx   . Kidney disease Neg Hx   . Learning disabilities Neg Hx   . Mental illness Neg Hx   . Mental retardation Neg Hx    . Miscarriages / Stillbirths Neg Hx   . Stroke Neg Hx   . Vision loss Neg Hx    Past Surgical History:  Procedure Laterality Date  . CESAREAN SECTION       Vanessa Kick, MD 06/29/19 [phone]

## 2019-07-20 ENCOUNTER — Other Ambulatory Visit: Payer: Self-pay | Admitting: Allergy

## 2019-07-20 NOTE — Telephone Encounter (Signed)
Informed patient she has refill available at the pharmacy. She will call pharmacy for refill.

## 2019-07-20 NOTE — Telephone Encounter (Signed)
Patient has lost her azelastine and would like to know if another one could be sent in to [facility] on [facility].

## 2019-08-05 ENCOUNTER — Emergency Department (HOSPITAL_COMMUNITY)
Admission: EM | Admit: 2019-08-05 | Discharge: 2019-08-05 | Disposition: A | Discharge status: Home / Self Care | Payer: Medicaid Other | Attending: Emergency Medicine | Admitting: Emergency Medicine

## 2019-08-05 ENCOUNTER — Other Ambulatory Visit: Payer: Self-pay

## 2019-08-05 ENCOUNTER — Encounter (HOSPITAL_COMMUNITY): Payer: Self-pay | Admitting: *Deleted

## 2019-08-05 DIAGNOSIS — Z79899 Other long term (current) drug therapy: Secondary | ICD-10-CM | POA: Diagnosis not present

## 2019-08-05 DIAGNOSIS — N644 Mastodynia: Secondary | ICD-10-CM | POA: Insufficient documentation

## 2019-08-05 NOTE — ED Triage Notes (Signed)
Pt states that she has breast pain x 6 months with the left breast hurting more than the right.  Pt states that her MD has conducted a breast exam but found no lumps.  Pt states that breast pain hasn't changed for the past six months.  Pt also states that she feels her black hair dye is causing her to have headaches and scalp soreness.  She noticed these symptoms in June when she dyed her hair.  Pt also reports sinus problems.  Pt a/o x 4 and ambulatory in triage.

## 2019-08-05 NOTE — ED Provider Notes (Signed)
[facility]EMERGENCY DEPT Provider Note   CSN: [id number] Arrival date & time: 08/05/19  1753     History   Chief Complaint Chief Complaint  Patient presents with  . Allergic Reaction  . Breast Pain    HPI Kellie Davila is a 33 y.o. female with no significant past medical history presents to the ED for a six-month history of left breast discomfort and 40-month history of intermittent head tightness, possibly resulting from hair products.  She states that she used hair dye that she has used in the past approximately 3 days ago and she has since developed some head tightness, but no respiratory difficulty, nausea, vomiting, or hives.  She states that she has issues with her sinuses, but denies any nasal congestion, rhinorrhea, or purulent nasal discharge.  She denies any other symptoms at this time and states that she has an appointment with her PCP tomorrow.      HPI  Past Medical History:  Diagnosis Date  . Anemia   . Anxiety    worse when on meds  . Vaginal Pap smear, abnormal    did bx, ok since    Patient Active Problem List   Diagnosis Date Noted  . Bacterial vaginitis 07/05/2017  . Abdominal pain affecting pregnancy, antepartum 01/25/2016  . Previous cesarean delivery affecting pregnancy, antepartum 01/25/2016  . 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 Abnormal Pap smear of cervix 12/27/2014    Past Surgical History:  Procedure Laterality Date  . CESAREAN SECTION       OB History    Gravida  3   Para  1   Term  1   Preterm      AB  2   Living  1     SAB  0   TAB  2   Ectopic  0   Multiple  0   Live Births  1            Home Medications    Prior to Admission medications   Medication Sig Start Date End Date Taking? Authorizing Provider  albuterol (VENTOLIN HFA) 108 (90 Base) MCG/ACT inhaler Inhale 1-2 puffs into the lungs every 6 (six) hours as needed for wheezing or shortness of breath. Patient not taking: Reported on  06/17/2019 04/15/19   Wieters, Hallie C, PA-C  azelastine (ASTELIN) 0.1 % nasal spray Place 2 sprays into both nostrils 2 (two) times daily. Use in each nostril as directed 06/17/19   Marcelyn Bruins, MD  cetirizine (ZYRTEC) 10 MG tablet Take 10 mg by mouth daily.    [provider]  cyclobenzaprine (FLEXERIL) 5 MG tablet Take 1-2 tablets (5-10 mg total) by mouth 2 (two) times daily as needed for muscle spasms. Patient not taking: Reported on 06/17/2019 04/15/19   Wieters, Hallie C, PA-C  diclofenac (VOLTAREN) 75 MG EC tablet Take 1 tablet (75 mg total) by mouth 2 (two) times daily. 06/27/19   Mardella Layman, MD  fluticasone (FLOVENT HFA) 44 MCG/ACT inhaler Inhale 2 puffs into the lungs 2 (two) times daily. 06/17/19   Marcelyn Bruins, MD  Fluticasone Propionate Timmothy Sours) 93 MCG/ACT EXHU Place 2 sprays into both nostrils 2 (two) times daily. 06/17/19   Marcelyn Bruins, MD  levocetirizine (XYZAL) 5 MG tablet Take 1 tablet (5 mg total) by mouth every evening. 06/17/19   Marcelyn Bruins, MD  Olopatadine HCl (PAZEO) 0.7 % SOLN Place 1 drop into both eyes daily as needed. Patient not taking: Reported on  06/17/2019 02/04/19   Marcelyn BruinsPadgett, Shaylar Patricia, MD  promethazine (PHENERGAN) 25 MG tablet Take 1 tablet (25 mg total) by mouth every 6 (six) hours as needed for nausea or vomiting. 06/27/19   Mardella LaymanHagler, Brian, MD  triamcinolone (NASACORT) 55 MCG/ACT AERO nasal inhaler Place 2 sprays into the nose daily. Patient not taking: Reported on 06/17/2019 02/04/19   Marcelyn BruinsPadgett, Shaylar Patricia, MD    Family History Family History  Problem Relation Age of Onset  . Allergic rhinitis Mother   . Alcohol abuse Neg Hx   . Arthritis Neg Hx   . Asthma Neg Hx   . Birth defects Neg Hx   . Cancer Neg Hx   . COPD Neg Hx   . Depression Neg Hx   . Diabetes Neg Hx   . Drug abuse Neg Hx   . Early death Neg Hx   . Hearing loss Neg Hx   . Heart disease Neg Hx   . Hyperlipidemia Neg Hx   .  Hypertension Neg Hx   . Kidney disease Neg Hx   . Learning disabilities Neg Hx   . Mental illness Neg Hx   . Mental retardation Neg Hx   . Miscarriages / Stillbirths Neg Hx   . Stroke Neg Hx   . Vision loss Neg Hx     Social History Social History   Tobacco Use  . Smoking status: Never Smoker  . Smokeless tobacco: Never Used  Substance Use Topics  . Alcohol use: No  . Drug use: No     Allergies   Compazine [prochlorperazine edisylate] and Reglan [metoclopramide]   Review of Systems Review of Systems  All other systems reviewed and are negative.    Physical Exam Updated Vital Signs BP 112/78 (BP Location: Right Arm)   Pulse 71   Temp 98.6 F (37 C) (Oral)   Resp 18   Ht 5\' 2"  (1.575 m)   Wt 81.6 kg   LMP 07/27/2019   SpO2 100%   BMI 32.92 kg/m   Physical Exam Vitals signs and nursing note reviewed. Exam conducted with a chaperone present.  Constitutional:      Appearance: Normal appearance.  HENT:     Head: Normocephalic and atraumatic.     Comments: Head discomfort not exacerbated by leaning forward.  No maxillary or frontal sinus tenderness to palpation.    Nose: Nose normal. No congestion or rhinorrhea.     Mouth/Throat:     Comments: Patent oropharynx.  No masses or imminent occlusion. Eyes:     General: No scleral icterus.    Conjunctiva/sclera: Conjunctivae normal.  Neck:     Musculoskeletal: Normal range of motion and neck supple. No neck rigidity or muscular tenderness.  Cardiovascular:     Rate and Rhythm: Normal rate.  Pulmonary:     Effort: Pulmonary effort is normal.     Breath sounds: No wheezing.  Abdominal:     General: There is no distension.     Palpations: Abdomen is soft.     Tenderness: There is no guarding.  Skin:    General: Skin is dry.     Capillary Refill: Capillary refill takes less than 2 seconds.  Neurological:     General: No focal deficit present.     Mental Status: She is alert and oriented to person, place, and  time.     GCS: GCS eye subscore is 4. GCS verbal subscore is 5. GCS motor subscore is 6.     Cranial Nerves:  No cranial nerve deficit.     Sensory: No sensory deficit.     Motor: No weakness.     Coordination: Coordination normal.     Gait: Gait normal.  Psychiatric:        Mood and Affect: Mood normal.        Behavior: Behavior normal.        Thought Content: Thought content normal.      ED Treatments / Results  Labs (all labs ordered are listed, but only abnormal results are displayed) Labs Reviewed - No data to display  EKG None  Radiology No results found.  Procedures Procedures (including critical care time)  Medications Ordered in ED Medications - No data to display   Initial Impression / Assessment and Plan / ED Course  I have reviewed the triage vital signs and the nursing notes.  Pertinent labs & imaging results that were available during my care of the patient were reviewed by me and considered in my medical decision making (see chart for details).        Evidently patient has had breast exams performed by her PCP which is revealed no abnormalities.  She describes the area of discomfort as occasionally feeling like a "rope" and I informed her about fibrocystic breast changes.  Upon doing so, she was excited and stated that her friend had something similar and that is probably what she is experiencing.  That said, I encouraged her to follow-up with her PCP tomorrow at her appointment to determine whether or not a mammogram would be advised.  Regarding her chronic sinus discomfort, agreed that perhaps an ENT referral and outpatient CT of the sinuses would be beneficial for her.  However, she has no evidence of an acute sinusitis and at this time there is no imaging or labs that are warranted.  She is afebrile, hemodynamically stable, and and her vital signs are perfect.  She looks healthy and has no other complaints.  No evidence of skin reaction or respiratory  difficulty, wheezing, or other symptoms or signs that would be concerning for anaphylaxis.  Inform the patient that we would not be obtaining any labs or imaging for her today, nor which should we prescribe any medications for her to go home.  She states that a prednisone burst has helped in the past, but I recommended that she run that by her PCP as she is going to be seeing her tomorrow.  She states that with Tylenol and ibuprofen, she can alleviate the discomfort and make it to her appointment tomorrow. Discussed case with Dr. Tyrone Nine and he agrees with assessment and plan.    Final Clinical Impressions(s) / ED Diagnoses   Final diagnoses:  Breast pain, left    ED Discharge Orders    None       Corena Herter, PA-C 08/05/19 Anthon, [place], DO 08/05/19 2303

## 2019-08-05 NOTE — Discharge Instructions (Signed)
Please see your provider tomorrow as scheduled.  Request outpatient referral to ENT for your ongoing sinus trouble for possible CT sinuses..  Do not suspect acute sinusitis at this time.  Discussed your 75-month history of ongoing allergic-like reactions to your hair dye and hair products.  Do not see any evidence of an anaphylactic reaction today.  If prednisone burst helps and you are not diabetic, perhaps your PCP would consider that.  Please return to the ED if you develop any fevers, chills, purulent nasal discharge, worsening headache, respiratory difficulty, or if you develop any neurologic deficits.

## 2019-08-05 NOTE — ED Notes (Signed)
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 Ambulatory without difficulty in NAD. Patient left prior to receiving d/c paperwork.

## 2019-08-11 ENCOUNTER — Other Ambulatory Visit: Payer: Self-pay | Admitting: Physician Assistant

## 2019-08-11 DIAGNOSIS — N644 Mastodynia: Secondary | ICD-10-CM

## 2019-08-12 ENCOUNTER — Other Ambulatory Visit: Payer: Self-pay | Admitting: Physician Assistant

## 2019-08-13 ENCOUNTER — Other Ambulatory Visit: Payer: Medicaid Other

## 2019-08-26 ENCOUNTER — Ambulatory Visit: Payer: Medicaid Other

## 2019-08-26 ENCOUNTER — Ambulatory Visit
Admission: RE | Admit: 2019-08-26 | Discharge: 2019-08-26 | Disposition: A | Discharge status: Home / Self Care | Payer: Medicaid Other | Source: Ambulatory Visit | Attending: Physician Assistant | Admitting: Physician Assistant

## 2019-08-26 ENCOUNTER — Other Ambulatory Visit: Payer: Self-pay

## 2019-08-26 DIAGNOSIS — N644 Mastodynia: Secondary | ICD-10-CM

## 2019-10-02 ENCOUNTER — Ambulatory Visit: Payer: Medicaid Other | Admitting: Allergy

## 2019-12-18 ENCOUNTER — Ambulatory Visit: Payer: Medicaid Other | Admitting: Allergy

## 2020-01-13 ENCOUNTER — Telehealth: Payer: Self-pay | Admitting: Allergy

## 2020-01-13 MED ORDER — LEVOCETIRIZINE DIHYDROCHLORIDE 5 MG PO TABS: 5.0000 mg | ORAL_TABLET | Freq: Every evening | ORAL | 0 refills | Status: DC

## 2020-01-13 MED ORDER — AZELASTINE HCL 0.1 % NA SOLN: 2.0000 | Freq: Two times a day (BID) | NASAL | 0 refills | Status: DC

## 2020-01-13 NOTE — Telephone Encounter (Signed)
Courtesy refills have been sent in to the requested pharmacy. Called patient and advised to keep appointment so that we can refill more of her medications. Patient verbalized understanding.

## 2020-01-13 NOTE — Telephone Encounter (Signed)
Patient is requesting refills for azelastine and levocetirizine. She has made an appointment for 02/10/2020. [facility] on [address].

## 2020-01-18 ENCOUNTER — Other Ambulatory Visit: Payer: Self-pay

## 2020-01-18 ENCOUNTER — Encounter (HOSPITAL_COMMUNITY): Payer: Self-pay | Admitting: Emergency Medicine

## 2020-01-18 ENCOUNTER — Ambulatory Visit (HOSPITAL_COMMUNITY)
Admission: EM | Admit: 2020-01-18 | Discharge: 2020-01-18 | Disposition: A | Discharge status: Home / Self Care | Payer: Medicaid Other | Attending: Internal Medicine | Admitting: Internal Medicine

## 2020-01-18 DIAGNOSIS — S91332A Puncture wound without foreign body, left foot, initial encounter: Secondary | ICD-10-CM

## 2020-01-18 DIAGNOSIS — Z23 Encounter for immunization: Secondary | ICD-10-CM

## 2020-01-18 MED ORDER — TETANUS-DIPHTH-ACELL PERTUSSIS 5-2.5-18.5 LF-MCG/0.5 IM SUSP
INTRAMUSCULAR | Status: AC
  Filled 2020-01-18: qty 0.5

## 2020-01-18 MED ORDER — LEVOFLOXACIN 500 MG PO TABS: 500.0000 mg | ORAL_TABLET | Freq: Every day | ORAL | 0 refills | Status: AC

## 2020-01-18 MED ORDER — TETANUS-DIPHTH-ACELL PERTUSSIS 5-2.5-18.5 LF-MCG/0.5 IM SUSP
0.5000 mL | Freq: Once | INTRAMUSCULAR | Status: AC
  Administered 2020-01-18: 20:00:00 0.5 mL via INTRAMUSCULAR

## 2020-01-18 NOTE — ED Provider Notes (Signed)
[facility]    CSN: [id number] Arrival date & time: 01/18/20  1939      History   Chief Complaint Chief Complaint  Patient presents with  . Laceration    HPI Kellie Davila is a 34 y.o. female comes to urgent care after she sustained a puncture wound to the left foot.  Patient was walking out of her home .  Injury occurred this afternoon.  It bled quite a bit but subsided spontaneously.  No swelling of the foot.   HPI  Past Medical History:  Diagnosis Date  . Anemia   . Anxiety    worse when on meds  . Vaginal Pap smear, abnormal    did bx, ok since    Patient Active Problem List   Diagnosis Date Noted  . Bacterial vaginitis 07/05/2017  . Abdominal pain affecting pregnancy, antepartum 01/25/2016  . Previous cesarean delivery affecting pregnancy, antepartum 01/25/2016  . 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 Abnormal Pap smear of cervix 12/27/2014    Past Surgical History:  Procedure Laterality Date  . CESAREAN SECTION      OB History    Gravida  3   Para  1   Term  1   Preterm      AB  2   Living  1     SAB  0   TAB  2   Ectopic  0   Multiple  0   Live Births  1            Home Medications    Prior to Admission medications   Medication Sig Start Date End Date Taking? Authorizing Provider  azelastine (ASTELIN) 0.1 % nasal spray Place 2 sprays into both nostrils 2 (two) times daily. Use in each nostril as directed 01/13/20   Marcelyn Bruins, MD  cetirizine (ZYRTEC) 10 MG tablet Take 10 mg by mouth daily.    [provider]  fluticasone (FLOVENT HFA) 44 MCG/ACT inhaler Inhale 2 puffs into the lungs 2 (two) times daily. 06/17/19   Marcelyn Bruins, MD  Fluticasone Propionate Timmothy Sours) 93 MCG/ACT EXHU Place 2 sprays into both nostrils 2 (two) times daily. 06/17/19   Marcelyn Bruins, MD  levocetirizine (XYZAL) 5 MG tablet Take 1 tablet (5 mg total) by mouth every evening. 01/13/20   Marcelyn Bruins, MD  levofloxacin (LEVAQUIN) 500 MG tablet Take 1 tablet (500 mg total) by mouth daily for 3 days. 01/18/20 01/21/20  Merrilee Jansky, MD  albuterol (VENTOLIN HFA) 108 (90 Base) MCG/ACT inhaler Inhale 1-2 puffs into the lungs every 6 (six) hours as needed for wheezing or shortness of breath. Patient not taking: Reported on 06/17/2019 04/15/19 01/18/20  Wieters, Junius Creamer, PA-C  promethazine (PHENERGAN) 25 MG tablet Take 1 tablet (25 mg total) by mouth every 6 (six) hours as needed for nausea or vomiting. Patient not taking: Reported on 01/18/2020 06/27/19 01/18/20  Mardella Layman, MD  triamcinolone (NASACORT) 55 MCG/ACT AERO nasal inhaler Place 2 sprays into the nose daily. Patient not taking: Reported on 06/17/2019 02/04/19 01/18/20  Marcelyn Bruins, MD    Family History Family History  Problem Relation Age of Onset  . Allergic rhinitis Mother   . Alcohol abuse Neg Hx   . Arthritis Neg Hx   . Asthma Neg Hx   . Birth defects Neg Hx   . Cancer Neg Hx   . COPD Neg Hx   . Depression Neg Hx   . Diabetes  Neg Hx   . Drug abuse Neg Hx   . Early death Neg Hx   . Hearing loss Neg Hx   . Heart disease Neg Hx   . Hyperlipidemia Neg Hx   . Hypertension Neg Hx   . Kidney disease Neg Hx   . Learning disabilities Neg Hx   . Mental illness Neg Hx   . Mental retardation Neg Hx   . Miscarriages / Stillbirths Neg Hx   . Stroke Neg Hx   . Vision loss Neg Hx     Social History Social History   Tobacco Use  . Smoking status: Never Smoker  . Smokeless tobacco: Never Used  Substance Use Topics  . Alcohol use: No  . Drug use: No     Allergies   Compazine [prochlorperazine edisylate] and Reglan [metoclopramide]   Review of Systems Review of Systems  Musculoskeletal: Negative for arthralgias and myalgias.  Skin: Positive for wound.  Hematological: Does not bruise/bleed easily.     Physical Exam Triage Vital Signs ED Triage Vitals  Enc Vitals Group     BP 01/18/20 2002  114/72     Pulse Rate 01/18/20 2002 99     Resp 01/18/20 2002 18     Temp 01/18/20 2002 98.5 F (36.9 C)     Temp Source 01/18/20 2002 Oral     SpO2 01/18/20 2002 99 %     Weight --      Height --      Head Circumference --      Peak Flow --      Pain Score 01/18/20 2003 4     Pain Loc --      Pain Edu? --      Excl. in GC? --    No data found.  Updated Vital Signs BP 114/72 (BP Location: Right Arm)   Pulse 99   Temp 98.5 F (36.9 C) (Oral)   Resp 18   SpO2 99%   Visual Acuity Right Eye Distance:   Left Eye Distance:   Bilateral Distance:    Right Eye Near:   Left Eye Near:    Bilateral Near:     Physical Exam Musculoskeletal:     Comments: Puncture wound on the plantar surface in the region of the fourth toe of the left foot.  No bleeding.  No erythema.  Skin:    General: Skin is warm.     Capillary Refill: Capillary refill takes less than 2 seconds.      UC Treatments / Results  Labs (all labs ordered are listed, but only abnormal results are displayed) Labs Reviewed - No data to display  EKG   Radiology No results found.  Procedures Procedures (including critical care time)  Medications Ordered in UC Medications  Tdap (BOOSTRIX) injection 0.5 mL (has no administration in time range)    Initial Impression / Assessment and Plan / UC Course  I have reviewed the triage vital signs and the nursing notes.  Pertinent labs & imaging results that were available during my care of the patient were reviewed by me and considered in my medical decision making (see chart for details).     1.  Puncture wound on the sole of the left foot: Update tetanus injection Levaquin 500 mg x 3 days for prophylaxis given the nature of the injury Return precautions given Topical antibiotic application with daily wound dressing changes. Final Clinical Impressions(s) / UC Diagnoses   Final diagnoses:  Puncture wound of  left foot, initial encounter   Discharge  Instructions   None    ED Prescriptions    Medication Sig Dispense Auth. Provider   levofloxacin (LEVAQUIN) 500 MG tablet Take 1 tablet (500 mg total) by mouth daily for 3 days. 3 tablet Khambrel Amsden, Myrene Galas, MD     PDMP not reviewed this encounter.   Chase Picket, MD 01/18/20 2026

## 2020-01-18 NOTE — ED Triage Notes (Signed)
Pt here with small abrasion/laceration to bottom of left foot at pinky toe area from rusted metal today per pt; no bleeding noted at present

## 2020-02-10 ENCOUNTER — Ambulatory Visit: Payer: Medicaid Other | Admitting: Allergy

## 2020-04-09 ENCOUNTER — Encounter (HOSPITAL_COMMUNITY): Payer: Self-pay

## 2020-04-09 ENCOUNTER — Ambulatory Visit (HOSPITAL_COMMUNITY)
Admission: EM | Admit: 2020-04-09 | Discharge: 2020-04-09 | Disposition: A | Discharge status: Home / Self Care | Payer: Medicaid Other | Attending: Physician Assistant | Admitting: Physician Assistant

## 2020-04-09 ENCOUNTER — Other Ambulatory Visit: Payer: Self-pay

## 2020-04-09 DIAGNOSIS — Z7951 Long term (current) use of inhaled steroids: Secondary | ICD-10-CM | POA: Diagnosis not present

## 2020-04-09 DIAGNOSIS — Z79899 Other long term (current) drug therapy: Secondary | ICD-10-CM | POA: Diagnosis not present

## 2020-04-09 DIAGNOSIS — Z20822 Contact with and (suspected) exposure to covid-19: Secondary | ICD-10-CM | POA: Diagnosis not present

## 2020-04-09 DIAGNOSIS — Z888 Allergy status to other drugs, medicaments and biological substances status: Secondary | ICD-10-CM | POA: Insufficient documentation

## 2020-04-09 DIAGNOSIS — J069 Acute upper respiratory infection, unspecified: Secondary | ICD-10-CM | POA: Diagnosis not present

## 2020-04-09 LAB — SARS CORONAVIRUS 2 (TAT 6-24 HRS): SARS Coronavirus 2: NEGATIVE

## 2020-04-09 MED ORDER — CETIRIZINE-PSEUDOEPHEDRINE ER 5-120 MG PO TB12: 1.0000 | ORAL_TABLET | Freq: Every day | ORAL | 0 refills | Status: DC

## 2020-04-09 MED ORDER — BENZONATATE 200 MG PO CAPS: 200.0000 mg | ORAL_CAPSULE | Freq: Three times a day (TID) | ORAL | 0 refills | Status: DC | PRN

## 2020-04-09 MED ORDER — PROMETHAZINE HCL 25 MG RE SUPP: 25.0000 mg | Freq: Four times a day (QID) | RECTAL | 0 refills | Status: DC | PRN

## 2020-04-09 NOTE — ED Provider Notes (Signed)
[facility]    CSN: [id number] Arrival date & time: 04/09/20  1043      History   Chief Complaint Chief Complaint  Patient presents with  . Sore Throat  . Chills  . Otalgia  . Headache  . Generalized Body Aches  . Diarrhea    HPI Kellie Davila is a 34 y.o. female.   Patient here c/w "feeling sick" x 1 day.  Admits nasal congestion, rhinorrhea, sore throat, PND, productive cough, nausea, denies f/c, vomiting, adominal pain, wheezing, SOB.  No sick contacts, no known exposures to COVID 19, she is fully vaccinated against COVID19.     Past Medical History:  Diagnosis Date  . Anemia   . Anxiety    worse when on meds  . Vaginal Pap smear, abnormal    did bx, ok since    Patient Active Problem List   Diagnosis Date Noted  . Bacterial vaginitis 07/05/2017  . Abdominal pain affecting pregnancy, antepartum 01/25/2016  . Previous cesarean delivery affecting pregnancy, antepartum 01/25/2016  . 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 Abnormal Pap smear of cervix 12/27/2014    Past Surgical History:  Procedure Laterality Date  . CESAREAN SECTION      OB History    Gravida  3   Para  1   Term  1   Preterm      AB  2   Living  1     SAB  0   TAB  2   Ectopic  0   Multiple  0   Live Births  1            Home Medications    Prior to Admission medications   Medication Sig Start Date End Date Taking? Authorizing Provider  azelastine (ASTELIN) 0.1 % nasal spray Place 2 sprays into both nostrils 2 (two) times daily. Use in each nostril as directed 01/13/20   Marcelyn Bruins, MD  benzonatate (TESSALON) 200 MG capsule Take 1 capsule (200 mg total) by mouth 3 (three) times daily as needed for cough. 04/09/20   Evern Core, PA-C  cetirizine (ZYRTEC) 10 MG tablet Take 10 mg by mouth daily.    [provider]  cetirizine-pseudoephedrine (ZYRTEC-D) 5-120 MG tablet Take 1 tablet by mouth daily. 04/09/20   Evern Core, PA-C    fluticasone (FLOVENT HFA) 44 MCG/ACT inhaler Inhale 2 puffs into the lungs 2 (two) times daily. 06/17/19   Marcelyn Bruins, MD  Fluticasone Propionate Timmothy Sours) 93 MCG/ACT EXHU Place 2 sprays into both nostrils 2 (two) times daily. 06/17/19   Marcelyn Bruins, MD  levocetirizine (XYZAL) 5 MG tablet Take 1 tablet (5 mg total) by mouth every evening. 01/13/20   Marcelyn Bruins, MD  promethazine (PHENERGAN) 25 MG suppository Place 1 suppository (25 mg total) rectally every 6 (six) hours as needed for nausea or vomiting. 04/09/20   Evern Core, PA-C  albuterol (VENTOLIN HFA) 108 (90 Base) MCG/ACT inhaler Inhale 1-2 puffs into the lungs every 6 (six) hours as needed for wheezing or shortness of breath. Patient not taking: Reported on 06/17/2019 04/15/19 01/18/20  Wieters, Hallie C, PA-C  triamcinolone (NASACORT) 55 MCG/ACT AERO nasal inhaler Place 2 sprays into the nose daily. Patient not taking: Reported on 06/17/2019 02/04/19 01/18/20  Marcelyn Bruins, MD    Family History Family History  Problem Relation Age of Onset  . Allergic rhinitis Mother   . Alcohol abuse Neg Hx   . Arthritis Neg Hx   .  Asthma Neg Hx   . Birth defects Neg Hx   . Cancer Neg Hx   . COPD Neg Hx   . Depression Neg Hx   . Diabetes Neg Hx   . Drug abuse Neg Hx   . Early death Neg Hx   . Hearing loss Neg Hx   . Heart disease Neg Hx   . Hyperlipidemia Neg Hx   . Hypertension Neg Hx   . Kidney disease Neg Hx   . Learning disabilities Neg Hx   . Mental illness Neg Hx   . Mental retardation Neg Hx   . Miscarriages / Stillbirths Neg Hx   . Stroke Neg Hx   . Vision loss Neg Hx     Social History Social History   Tobacco Use  . Smoking status: Never Smoker  . Smokeless tobacco: Never Used  Vaping Use  . Vaping Use: Never used  Substance Use Topics  . Alcohol use: No  . Drug use: No     Allergies   Compazine [prochlorperazine edisylate] and Reglan [metoclopramide]   Review  of Systems Review of Systems  Constitutional: Negative for chills and fever.  HENT: Positive for congestion, ear pain, postnasal drip, rhinorrhea, sinus pressure and sore throat. Negative for dental problem, ear discharge, facial swelling, nosebleeds and sneezing.   Eyes: Negative for pain, redness and visual disturbance.  Respiratory: Positive for cough. Negative for shortness of breath and wheezing.   Cardiovascular: Negative for chest pain and palpitations.  Gastrointestinal: Positive for nausea. Negative for abdominal pain, diarrhea and vomiting.  Genitourinary: Negative for dysuria and hematuria.  Musculoskeletal: Negative for arthralgias and back pain.  Skin: Negative for color change and rash.  Neurological: Negative for seizures and syncope.  Hematological: Negative for adenopathy. Does not bruise/bleed easily.  Psychiatric/Behavioral: Negative for confusion and sleep disturbance.  All other systems reviewed and are negative.    Physical Exam Triage Vital Signs ED Triage Vitals  Enc Vitals Group     BP 04/09/20 1138 111/72     Pulse Rate 04/09/20 1138 64     Resp --      Temp 04/09/20 1138 98.5 F (36.9 C)     Temp Source 04/09/20 1138 Oral     SpO2 04/09/20 1138 100 %     Weight --      Height --      Head Circumference --      Peak Flow --      Pain Score 04/09/20 1137 5     Pain Loc --      Pain Edu? --      Excl. in GC? --    No data found.  Updated Vital Signs BP 111/72 (BP Location: Right Arm)   Pulse 64   Temp 98.5 F (36.9 C) (Oral)   LMP 04/05/2020 (Exact Date)   SpO2 100%   Visual Acuity Right Eye Distance:   Left Eye Distance:   Bilateral Distance:    Right Eye Near:   Left Eye Near:    Bilateral Near:     Physical Exam Vitals and nursing note reviewed.  Constitutional:      General: She is not in acute distress.    Appearance: Normal appearance. She is well-developed. She is obese. She is not ill-appearing or toxic-appearing.  HENT:      Head: Normocephalic and atraumatic.     Right Ear: Ear canal normal. No swelling or tenderness. A middle ear effusion is present. Tympanic membrane is  not injected, scarred, perforated or bulging.     Left Ear: Ear canal normal. No swelling or tenderness. A middle ear effusion is present. Tympanic membrane is not injected, scarred, perforated or bulging.     Nose: Mucosal edema, congestion and rhinorrhea present. Rhinorrhea is clear.     Right Turbinates: Swollen. Not enlarged.     Left Turbinates: Swollen. Not enlarged.     Right Sinus: No maxillary sinus tenderness or frontal sinus tenderness.     Left Sinus: No maxillary sinus tenderness or frontal sinus tenderness.     Mouth/Throat:     Mouth: Mucous membranes are moist.     Pharynx: Oropharynx is clear. No pharyngeal swelling, oropharyngeal exudate, posterior oropharyngeal erythema or uvula swelling.     Tonsils: No tonsillar exudate or tonsillar abscesses. 0 on the right. 0 on the left.  Eyes:     Conjunctiva/sclera: Conjunctivae normal.  Cardiovascular:     Rate and Rhythm: Normal rate and regular rhythm.     Heart sounds: No murmur heard.   Pulmonary:     Effort: Pulmonary effort is normal. No respiratory distress.     Breath sounds: Normal breath sounds. No wheezing, rhonchi or rales.  Musculoskeletal:        General: Normal range of motion.     Cervical back: Normal range of motion and neck supple. No rigidity.  Lymphadenopathy:     Cervical: No cervical adenopathy.  Skin:    General: Skin is warm and dry.     Capillary Refill: Capillary refill takes less than 2 seconds.  Neurological:     General: No focal deficit present.     Mental Status: She is alert and oriented to person, place, and time.     Motor: No weakness.     Gait: Gait normal.  Psychiatric:        Mood and Affect: Mood normal.        Behavior: Behavior normal.      UC Treatments / Results  Labs (all labs ordered are listed, but only abnormal  results are displayed) Labs Reviewed  SARS CORONAVIRUS 2 (TAT 6-24 HRS)    EKG   Radiology No results found.  Procedures Procedures (including critical care time)  Medications Ordered in UC Medications - No data to display  Initial Impression / Assessment and Plan / UC Course  I have reviewed the triage vital signs and the nursing notes.  Pertinent labs & imaging results that were available during my care of the patient were reviewed by me and considered in my medical decision making (see chart for details).     Patient requested promethazine for nausea over zofran, stated zofran does not "work" for her. Take medications as prescribed. Follow up with PCP if no improvement after 1 week, return with new or worsening symptoms. Final Clinical Impressions(s) / UC Diagnoses   Final diagnoses:  Viral upper respiratory tract infection   Discharge Instructions   None    ED Prescriptions    Medication Sig Dispense Auth. Provider   cetirizine-pseudoephedrine (ZYRTEC-D) 5-120 MG tablet Take 1 tablet by mouth daily. 30 tablet Evern Core, PA-C   benzonatate (TESSALON) 200 MG capsule Take 1 capsule (200 mg total) by mouth 3 (three) times daily as needed for cough. 20 capsule Evern Core, PA-C   promethazine (PHENERGAN) 25 MG suppository Place 1 suppository (25 mg total) rectally every 6 (six) hours as needed for nausea or vomiting. 12 each Evern Core, PA-C  PDMP not reviewed this encounter.   Peri Jefferson, PA-C 04/09/20 1209

## 2020-12-04 ENCOUNTER — Encounter (HOSPITAL_COMMUNITY): Payer: Self-pay

## 2020-12-04 ENCOUNTER — Ambulatory Visit (HOSPITAL_COMMUNITY)
Admission: EM | Admit: 2020-12-04 | Discharge: 2020-12-04 | Disposition: A | Discharge status: Home / Self Care | Payer: Medicaid Other | Attending: Emergency Medicine | Admitting: Emergency Medicine

## 2020-12-04 DIAGNOSIS — J069 Acute upper respiratory infection, unspecified: Secondary | ICD-10-CM | POA: Diagnosis not present

## 2020-12-04 DIAGNOSIS — Z20822 Contact with and (suspected) exposure to covid-19: Secondary | ICD-10-CM

## 2020-12-04 LAB — SARS CORONAVIRUS 2 (TAT 6-24 HRS): SARS Coronavirus 2: NEGATIVE

## 2020-12-04 MED ORDER — IBUPROFEN 600 MG PO TABS: 600.0000 mg | ORAL_TABLET | Freq: Four times a day (QID) | ORAL | 0 refills | Status: AC | PRN

## 2020-12-04 NOTE — ED Triage Notes (Signed)
Pt in with c/o headache, fatigue, dry cough and ST that has been going on for 2 days now. Also states her chest feels tight  Pt has been taking ibuprofen for pain relief  Denies any diarrhea or vomiting

## 2020-12-04 NOTE — Discharge Instructions (Addendum)
Continue Astelin, Mucinex, start saline nasal irrigation with a [employer] Med rinse and distilled water as often as you want.  This will help reduce your allergies in addition to preventing a bacterial sinus infection.  Take 600 mg of ibuprofen combined with 1000 mg of Tylenol together 3-4 times a day as needed for pain.  Isolate till your Covid test comes back.  You will get the results through MyChart as soon as it comes out.  Follow-up here or with your doctor in a week to 8-days if you are not better and we can discuss starting you on antibiotics for sinus infection at that time.

## 2020-12-04 NOTE — ED Provider Notes (Signed)
HPI  SUBJECTIVE:  Kellie Davila is a 35 y.o. female who presents with 2 days of fatigue, sinus pain and pressure, nasal congestion sore throat, chest tightness, dry cough, occasional intermittent nausea and postnasal drip.  No fevers, body aches, other headaches, rhinorrhea, loss of sense of smell or taste, wheeze, shortness of breath, vomiting, diarrhea, abdominal pain.  She reports mild upper dental pain.  No facial swelling.  No allergy symptoms.  No known exposure to Covid.  She got the second dose of [employer] vaccine in April 21.  She states this feels like previous sinus infections.  She was on amoxicillin within the past month for tooth ache.  No antipyretic in the past 6 hours.  She has tried Mucinex, ibuprofen 400 mg twice daily and Astelin nasal spray.  Mucinex helps.  No aggravating factors.  She has a past medical history of allergies which are not bothering her now.  She is currently not taking any antihistamines.  She also has history of hypercholesterolemia.  LMP: 1/16.  Denies the possibility being pregnant.  FIE:[id number], Roena Malady     Past Medical History:  Diagnosis Date  . Anemia   . Anxiety    worse when on meds  . Vaginal Pap smear, abnormal    did bx, ok since    Past Surgical History:  Procedure Laterality Date  . CESAREAN SECTION      Family History  Problem Relation Age of Onset  . Allergic rhinitis Mother   . Alcohol abuse Neg Hx   . Arthritis Neg Hx   . Asthma Neg Hx   . Birth defects Neg Hx   . Cancer Neg Hx   . COPD Neg Hx   . Depression Neg Hx   . Diabetes Neg Hx   . Drug abuse Neg Hx   . Early death Neg Hx   . Hearing loss Neg Hx   . Heart disease Neg Hx   . Hyperlipidemia Neg Hx   . Hypertension Neg Hx   . Kidney disease Neg Hx   . Learning disabilities Neg Hx   . Mental illness Neg Hx   . Mental retardation Neg Hx   . Miscarriages / Stillbirths Neg Hx   . Stroke Neg Hx   . Vision loss Neg Hx     Social History   Tobacco Use   . Smoking status: Never Smoker  . Smokeless tobacco: Never Used  Vaping Use  . Vaping Use: Never used  Substance Use Topics  . Alcohol use: No  . Drug use: No    No current facility-administered medications for this encounter.  Current Outpatient Medications:  .  ibuprofen (ADVIL) 600 MG tablet, Take 1 tablet (600 mg total) by mouth every 6 (six) hours as needed., Disp: 30 tablet, Rfl: 0 .  azelastine (ASTELIN) 0.1 % nasal spray, Place 2 sprays into both nostrils 2 (two) times daily. Use in each nostril as directed, Disp: 30 mL, Rfl: 0 .  fluticasone (FLOVENT HFA) 44 MCG/ACT inhaler, Inhale 2 puffs into the lungs 2 (two) times daily., Disp: 1 Inhaler, Rfl: 5 .  Fluticasone Propionate (XHANCE) 93 MCG/ACT EXHU, Place 2 sprays into both nostrils 2 (two) times daily., Disp: 32 mL, Rfl: 6 .  promethazine (PHENERGAN) 25 MG suppository, Place 1 suppository (25 mg total) rectally every 6 (six) hours as needed for nausea or vomiting., Disp: 12 each, Rfl: 0  Allergies  Allergen Reactions  . Compazine [Prochlorperazine Edisylate] Anxiety  .  Reglan [Metoclopramide] Anxiety     ROS  As noted in HPI.   Physical Exam  BP 110/65   Pulse 76   Temp 98.3 F (36.8 C)   Resp 19   LMP 11/13/2020 (Exact Date)   SpO2 100%   Constitutional: Well developed, well nourished, no acute distress Eyes:  EOMI, conjunctiva normal bilaterally HENT: Normocephalic, atraumatic,mucus membranes moist.  Erythematous, swollen turbinates.  Positive mucoid nasal congestion.  No maxillary, frontal sinus tenderness.  Normal oropharynx, normal tonsils without exudates.  Uvula midline.  No postnasal drip. Neck: No cervical lymphadenopathy Respiratory: Normal inspiratory effort lungs clear bilaterally good air movement Cardiovascular: Normal rate regular rhythm no murmurs rubs or gallops GI: nondistended skin: No rash, skin intact Musculoskeletal: no deformities Neurologic: Alert & oriented x 3, no focal neuro  deficits Psychiatric: Speech and behavior appropriate   ED Course   Medications - No data to display  Orders Placed This Encounter  Procedures  . SARS CORONAVIRUS 2 (TAT 6-24 HRS) Nasopharyngeal Nasopharyngeal Swab    Standing Status:   Standing    Number of Occurrences:   1    Order Specific Question:   Is this test for diagnosis or screening    Answer:   Diagnosis of ill patient    Order Specific Question:   Symptomatic for COVID-19 as defined by CDC    Answer:   Yes    Order Specific Question:   Date of Symptom Onset    Answer:   12/02/2020    Order Specific Question:   Hospitalized for COVID-19    Answer:   No    Order Specific Question:   Admitted to ICU for COVID-19    Answer:   No    Order Specific Question:   Previously tested for COVID-19    Answer:   No    Order Specific Question:   Resident in a congregate (group) care setting    Answer:   No    Order Specific Question:   Employed in healthcare setting    Answer:   No    Order Specific Question:   Pregnant    Answer:   No    Order Specific Question:   Has patient completed COVID vaccination(s) (2 doses of Pfizer/Moderna 1 dose of [employer])    Answer:   Yes    Order Specific Question:   Has patient completed COVID Booster / 3rd dose    Answer:   No    Results for orders placed or performed during the hospital encounter of 12/04/20 (from the past 24 hour(s))  SARS CORONAVIRUS 2 (TAT 6-24 HRS) Nasopharyngeal Nasopharyngeal Swab     Status: None   Collection Time: 12/04/20  6:48 PM   Specimen: Nasopharyngeal Swab  Result Value Ref Range   SARS Coronavirus 2 NEGATIVE NEGATIVE   No results found.  ED Clinical Impression  1. Acute upper respiratory infection   2. Encounter for laboratory testing for COVID-19 virus      ED Assessment/Plan  COVID sent.  Patient with a viral URI/sinusitis.  Supportive treatment.  Continue Astelin, Mucinex, start saline nasal irrigation, will prescribe ibuprofen 600 mg  combined with 1000 mg of Tylenol 3-4 times a day.  Advised patient to isolate until her Covid test comes back. she will follow-up with her doctor return here if not better in a week and we can discuss starting her on antibiotics for sinus infection at that time.  COVID negative  Discussed labs, imaging,  MDM, treatment plan, and plan for follow-up with patient.patient agrees with plan.   Meds ordered this encounter  Medications  . ibuprofen (ADVIL) 600 MG tablet    Sig: Take 1 tablet (600 mg total) by mouth every 6 (six) hours as needed.    Dispense:  30 tablet    Refill:  0    *This clinic note was created using Scientist, clinical (histocompatibility and immunogenetics). Therefore, there may be occasional mistakes despite careful proofreading.   ?    Domenick Gong, MD 12/05/20 [phone]

## 2020-12-11 ENCOUNTER — Encounter (HOSPITAL_COMMUNITY): Payer: Self-pay

## 2020-12-11 ENCOUNTER — Ambulatory Visit (HOSPITAL_COMMUNITY)
Admission: EM | Admit: 2020-12-11 | Discharge: 2020-12-11 | Disposition: A | Discharge status: Home / Self Care | Payer: Medicaid Other | Attending: Emergency Medicine | Admitting: Emergency Medicine

## 2020-12-11 ENCOUNTER — Other Ambulatory Visit: Payer: Self-pay

## 2020-12-11 DIAGNOSIS — K1329 Other disturbances of oral epithelium, including tongue: Secondary | ICD-10-CM | POA: Diagnosis not present

## 2020-12-11 MED ORDER — NYSTATIN 100000 UNIT/ML MT SUSP: 500000.0000 [IU] | Freq: Four times a day (QID) | OROMUCOSAL | 0 refills | Status: DC

## 2020-12-11 NOTE — ED Provider Notes (Signed)
[facility]    CSN: [id number] Arrival date & time: 12/11/20  1404      History   Chief Complaint Chief Complaint  Patient presents with  . Rash    HPI Kellie Davila is a 35 y.o. female.   Patient presents with "thrush" on her tongue x3 days.  She describes this as yellow plaque on her tongue.  She states she has been on an antibiotic for the past week for a sinus infection.  She also had a wisdom tooth pulled last week.  She denies immunocompromising meds or illnesses.  She denies fever, chills, sore throat, difficulty swallowing, or other symptoms.  Patient was seen here on 12/04/2020; diagnosed with acute upper respiratory infection; treated symptomatically.  She had an e-visit with her PCP on 12/05/2020; treated with Augmentin and Diflucan.  She was seen by her PCP on 12/06/2020; diagnosed with RLQ abdominal pain and routine women's annual gynecological exam.  Her medical history includes anxiety and anemia.  The history is provided by the patient and medical records.    Past Medical History:  Diagnosis Date  . Anemia   . Anxiety    worse when on meds  . Vaginal Pap smear, abnormal    did bx, ok since    Patient Active Problem List   Diagnosis Date Noted  . Bacterial vaginitis 07/05/2017  . Abdominal pain affecting pregnancy, antepartum 01/25/2016  . Previous cesarean delivery affecting pregnancy, antepartum 01/25/2016  . 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 Abnormal Pap smear of cervix 12/27/2014    Past Surgical History:  Procedure Laterality Date  . CESAREAN SECTION      OB History    Gravida  3   Para  1   Term  1   Preterm      AB  2   Living  1     SAB  0   IAB  2   Ectopic  0   Multiple  0   Live Births  1            Home Medications    Prior to Admission medications   Medication Sig Start Date End Date Taking? Authorizing Provider  nystatin (MYCOSTATIN) 100000 UNIT/ML suspension Take 5 mLs (500,000 Units total) by mouth 4  (four) times daily. 12/11/20  Yes Mickie Bail, NP  azelastine (ASTELIN) 0.1 % nasal spray Place 2 sprays into both nostrils 2 (two) times daily. Use in each nostril as directed 01/13/20   Marcelyn Bruins, MD  fluticasone (FLOVENT HFA) 44 MCG/ACT inhaler Inhale 2 puffs into the lungs 2 (two) times daily. 06/17/19   Marcelyn Bruins, MD  Fluticasone Propionate Timmothy Sours) 93 MCG/ACT EXHU Place 2 sprays into both nostrils 2 (two) times daily. 06/17/19   Marcelyn Bruins, MD  ibuprofen (ADVIL) 600 MG tablet Take 1 tablet (600 mg total) by mouth every 6 (six) hours as needed. 12/04/20   Domenick Gong, MD  promethazine (PHENERGAN) 25 MG suppository Place 1 suppository (25 mg total) rectally every 6 (six) hours as needed for nausea or vomiting. 04/09/20   Evern Core, PA-C  albuterol (VENTOLIN HFA) 108 (90 Base) MCG/ACT inhaler Inhale 1-2 puffs into the lungs every 6 (six) hours as needed for wheezing or shortness of breath. Patient not taking: Reported on 06/17/2019 04/15/19 01/18/20  Wieters, Hallie C, PA-C  levocetirizine (XYZAL) 5 MG tablet Take 1 tablet (5 mg total) by mouth every evening. 01/13/20 12/04/20  Marcelyn Bruins, MD  triamcinolone (NASACORT) 55 MCG/ACT AERO nasal inhaler Place 2 sprays into the nose daily. Patient not taking: Reported on 06/17/2019 02/04/19 01/18/20  Marcelyn Bruins, MD    Family History Family History  Problem Relation Age of Onset  . Allergic rhinitis Mother   . Alcohol abuse Neg Hx   . Arthritis Neg Hx   . Asthma Neg Hx   . Birth defects Neg Hx   . Cancer Neg Hx   . COPD Neg Hx   . Depression Neg Hx   . Diabetes Neg Hx   . Drug abuse Neg Hx   . Early death Neg Hx   . Hearing loss Neg Hx   . Heart disease Neg Hx   . Hyperlipidemia Neg Hx   . Hypertension Neg Hx   . Kidney disease Neg Hx   . Learning disabilities Neg Hx   . Mental illness Neg Hx   . Mental retardation Neg Hx   . Miscarriages / Stillbirths Neg Hx   .  Stroke Neg Hx   . Vision loss Neg Hx     Social History Social History   Tobacco Use  . Smoking status: Never Smoker  . Smokeless tobacco: Never Used  Vaping Use  . Vaping Use: Never used  Substance Use Topics  . Alcohol use: No  . Drug use: No     Allergies   Compazine [prochlorperazine edisylate] and Reglan [metoclopramide]   Review of Systems Review of Systems  Constitutional: Negative for chills and fever.  HENT: Negative for ear pain, sore throat, trouble swallowing and voice change.        Tongue plaque  Eyes: Negative for pain and visual disturbance.  Respiratory: Negative for cough and shortness of breath.   Cardiovascular: Negative for chest pain and palpitations.  Gastrointestinal: Negative for abdominal pain and vomiting.  Genitourinary: Negative for dysuria and hematuria.  Musculoskeletal: Negative for arthralgias and back pain.  Skin: Negative for color change and rash.  Neurological: Negative for seizures and syncope.  All other systems reviewed and are negative.    Physical Exam Triage Vital Signs ED Triage Vitals  Enc Vitals Group     BP 12/11/20 1506 104/74     Pulse Rate 12/11/20 1506 62     Resp 12/11/20 1506 16     Temp 12/11/20 1506 98.1 F (36.7 C)     Temp Source 12/11/20 1506 Oral     SpO2 12/11/20 1506 100 %     Weight --      Height --      Head Circumference --      Peak Flow --      Pain Score 12/11/20 1507 0     Pain Loc --      Pain Edu? --      Excl. in GC? --    No data found.  Updated Vital Signs BP 104/74 (BP Location: Right Arm)   Pulse 62   Temp 98.1 F (36.7 C) (Oral)   Resp 16   LMP 12/11/2020   SpO2 100%   Visual Acuity Right Eye Distance:   Left Eye Distance:   Bilateral Distance:    Right Eye Near:   Left Eye Near:    Bilateral Near:     Physical Exam Vitals and nursing note reviewed.  Constitutional:      General: She is not in acute distress.    Appearance: She is well-developed and  well-nourished. She is not ill-appearing.  HENT:  Head: Normocephalic and atraumatic.     Mouth/Throat:     Mouth: Mucous membranes are moist.     Pharynx: Oropharynx is clear.     Comments: Thin white-yellow plaque on tongue which partially removed with scraping.  No plaque on buccal mucosa, palate, posterior pharynx. Eyes:     Conjunctiva/sclera: Conjunctivae normal.  Cardiovascular:     Rate and Rhythm: Normal rate and regular rhythm.     Heart sounds: Normal heart sounds.  Pulmonary:     Effort: Pulmonary effort is normal. No respiratory distress.     Breath sounds: Normal breath sounds.  Abdominal:     Palpations: Abdomen is soft.     Tenderness: There is no abdominal tenderness.  Musculoskeletal:        General: No edema.     Cervical back: Neck supple.  Skin:    General: Skin is warm and dry.  Neurological:     General: No focal deficit present.     Mental Status: She is alert and oriented to person, place, and time.     Gait: Gait normal.  Psychiatric:        Mood and Affect: Mood and affect and mood normal.        Behavior: Behavior normal.      UC Treatments / Results  Labs (all labs ordered are listed, but only abnormal results are displayed) Labs Reviewed - No data to display  EKG   Radiology No results found.  Procedures Procedures (including critical care time)  Medications Ordered in UC Medications - No data to display  Initial Impression / Assessment and Plan / UC Course  I have reviewed the triage vital signs and the nursing notes.  Pertinent labs & imaging results that were available during my care of the patient were reviewed by me and considered in my medical decision making (see chart for details).   Tongue plaque.  Patient may possibly have early oral thrush.  Treating with oral nystatin suspension.  Education provided on oral thrush.  Instructed patient to follow-up with her PCP if her symptoms are not improving.  She agrees to plan of  care.      Final Clinical Impressions(s) / UC Diagnoses   Final diagnoses:  Tongue plaque     Discharge Instructions     Use the Nystatin as directed.    Follow up with your primary care provider if your symptoms are not improving.        ED Prescriptions    Medication Sig Dispense Auth. Provider   nystatin (MYCOSTATIN) 100000 UNIT/ML suspension Take 5 mLs (500,000 Units total) by mouth 4 (four) times daily. 60 mL Mickie Bail, NP     PDMP not reviewed this encounter.   Mickie Bail, NP 12/11/20 1540

## 2020-12-11 NOTE — Discharge Instructions (Signed)
Use the Nystatin as directed.    Follow up with your primary care provider if your symptoms are not improving.    

## 2020-12-11 NOTE — ED Triage Notes (Signed)
Pt present thrush on her tongue with discoloration. The thrush is yellow. Pt states no foul odor but has a nasty taste in her mouth. She recently got her Tooth pulled and was on antibotics and rash appeared  After surgery.

## 2021-02-24 ENCOUNTER — Ambulatory Visit (HOSPITAL_COMMUNITY)
Admission: EM | Admit: 2021-02-24 | Discharge: 2021-02-24 | Disposition: A | Discharge status: Home / Self Care | Payer: Medicaid Other

## 2021-02-24 ENCOUNTER — Other Ambulatory Visit: Payer: Self-pay

## 2021-02-24 ENCOUNTER — Encounter (HOSPITAL_COMMUNITY): Payer: Self-pay

## 2021-02-24 DIAGNOSIS — J069 Acute upper respiratory infection, unspecified: Secondary | ICD-10-CM | POA: Diagnosis not present

## 2021-02-24 NOTE — Discharge Instructions (Signed)
Push fluids to ensure adequate hydration and keep secretions thin.  Over the counter  medications such as mucinex d to help with symptoms.  Continue with your allergy medications, ibuprofen and tylenol.  If symptoms worsen or do not improve in the next week to return to be seen or to follow up with your PCP.

## 2021-02-24 NOTE — ED Provider Notes (Signed)
[facility]    CSN: [id number] Arrival date & time: 02/24/21  5993      History   Chief Complaint Chief Complaint  Patient presents with  . Sore Throat  . Cough  . Nasal Congestion  . Otalgia    HPI Kellie Davila is a 35 y.o. female.   Kellie Davila presents with complaints of sore throat, cough and congestion which started three days ago. Cough has been productive. Nasal drainage and post nasal drip. No known fevers. No gi symptoms. Has been taking loratadine, tylenol and motrin which have provided minimal relief. No known ill contacts. She did have covid-19 in January of this year. She works as an Public librarian.   ROS per HPI, negative if not otherwise mentioned.      Past Medical History:  Diagnosis Date  . Anemia   . Anxiety    worse when on meds  . Vaginal Pap smear, abnormal    did bx, ok since    Patient Active Problem List   Diagnosis Date Noted  . Bacterial vaginitis 07/05/2017  . Abdominal pain affecting pregnancy, antepartum 01/25/2016  . Previous cesarean delivery affecting pregnancy, antepartum 01/25/2016  . 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 Abnormal Pap smear of cervix 12/27/2014    Past Surgical History:  Procedure Laterality Date  . CESAREAN SECTION      OB History    Gravida  3   Para  1   Term  1   Preterm      AB  2   Living  1     SAB  0   IAB  2   Ectopic  0   Multiple  0   Live Births  1            Home Medications    Prior to Admission medications   Medication Sig Start Date End Date Taking? Authorizing Provider  azelastine (ASTELIN) 0.1 % nasal spray Place 2 sprays into both nostrils 2 (two) times daily. Use in each nostril as directed 01/13/20  Yes Padgett, Pilar Grammes, MD  fluticasone (FLOVENT HFA) 44 MCG/ACT inhaler Inhale 2 puffs into the lungs 2 (two) times daily. 06/17/19  Yes Padgett, Pilar Grammes, MD  Fluticasone Propionate Timmothy Sours) 93 MCG/ACT EXHU Place 2 sprays into both  nostrils 2 (two) times daily. 06/17/19  Yes Padgett, Pilar Grammes, MD  ibuprofen (ADVIL) 600 MG tablet Take 1 tablet (600 mg total) by mouth every 6 (six) hours as needed. 12/04/20  Yes Domenick Gong, MD  nystatin (MYCOSTATIN) 100000 UNIT/ML suspension Take 5 mLs (500,000 Units total) by mouth 4 (four) times daily. 12/11/20   Mickie Bail, NP  promethazine (PHENERGAN) 25 MG suppository Place 1 suppository (25 mg total) rectally every 6 (six) hours as needed for nausea or vomiting. 04/09/20   Evern Core, PA-C  albuterol (VENTOLIN HFA) 108 (90 Base) MCG/ACT inhaler Inhale 1-2 puffs into the lungs every 6 (six) hours as needed for wheezing or shortness of breath. Patient not taking: Reported on 06/17/2019 04/15/19 01/18/20  Wieters, Hallie C, PA-C  levocetirizine (XYZAL) 5 MG tablet Take 1 tablet (5 mg total) by mouth every evening. 01/13/20 12/04/20  Marcelyn Bruins, MD  triamcinolone (NASACORT) 55 MCG/ACT AERO nasal inhaler Place 2 sprays into the nose daily. Patient not taking: Reported on 06/17/2019 02/04/19 01/18/20  Marcelyn Bruins, MD    Family History Family History  Problem Relation Age of Onset  . Allergic rhinitis Mother   .  Alcohol abuse Neg Hx   . Arthritis Neg Hx   . Asthma Neg Hx   . Birth defects Neg Hx   . Cancer Neg Hx   . COPD Neg Hx   . Depression Neg Hx   . Diabetes Neg Hx   . Drug abuse Neg Hx   . Early death Neg Hx   . Hearing loss Neg Hx   . Heart disease Neg Hx   . Hyperlipidemia Neg Hx   . Hypertension Neg Hx   . Kidney disease Neg Hx   . Learning disabilities Neg Hx   . Mental illness Neg Hx   . Mental retardation Neg Hx   . Miscarriages / Stillbirths Neg Hx   . Stroke Neg Hx   . Vision loss Neg Hx     Social History Social History   Tobacco Use  . Smoking status: Never Smoker  . Smokeless tobacco: Never Used  Vaping Use  . Vaping Use: Never used  Substance Use Topics  . Alcohol use: No  . Drug use: No     Allergies    Compazine [prochlorperazine edisylate] and Reglan [metoclopramide]   Review of Systems Review of Systems   Physical Exam Triage Vital Signs ED Triage Vitals  Enc Vitals Group     BP 02/24/21 0857 111/68     Pulse Rate 02/24/21 0857 (!) 58     Resp 02/24/21 0857 18     Temp 02/24/21 0857 98.8 F (37.1 C)     Temp src --      SpO2 02/24/21 0857 100 %     Weight --      Height --      Head Circumference --      Peak Flow --      Pain Score 02/24/21 0855 7     Pain Loc --      Pain Edu? --      Excl. in GC? --    No data found.  Updated Vital Signs BP 111/68   Pulse (!) 58   Temp 98.8 F (37.1 C)   Resp 18   LMP 02/19/2021   SpO2 100%   Visual Acuity Right Eye Distance:   Left Eye Distance:   Bilateral Distance:    Right Eye Near:   Left Eye Near:    Bilateral Near:     Physical Exam Constitutional:      General: She is not in acute distress.    Appearance: She is well-developed.  HENT:     Right Ear: Tympanic membrane and ear canal normal.     Left Ear: Tympanic membrane and ear canal normal.     Nose: Mucosal edema and rhinorrhea present.     Mouth/Throat:     Mouth: Mucous membranes are moist. No oral lesions.     Pharynx: Oropharynx is clear. Uvula midline. No posterior oropharyngeal erythema or uvula swelling.     Tonsils: No tonsillar exudate.  Cardiovascular:     Rate and Rhythm: Normal rate.  Pulmonary:     Effort: Pulmonary effort is normal.  Skin:    General: Skin is warm and dry.  Neurological:     Mental Status: She is alert and oriented to person, place, and time.      UC Treatments / Results  Labs (all labs ordered are listed, but only abnormal results are displayed) Labs Reviewed - No data to display  EKG   Radiology No results found.  Procedures Procedures (including  critical care time)  Medications Ordered in UC Medications - No data to display  Initial Impression / Assessment and Plan / UC Course  I have reviewed  the triage vital signs and the nursing notes.  Pertinent labs & imaging results that were available during my care of the patient were reviewed by me and considered in my medical decision making (see chart for details).     Non toxic. Benign physical exam.  Viral vs allergic vs combination, likely. Supportive cares recommended. Return precautions provided. Patient verbalized understanding and agreeable to plan.   Final Clinical Impressions(s) / UC Diagnoses   Final diagnoses:  Upper respiratory tract infection, unspecified type     Discharge Instructions     Push fluids to ensure adequate hydration and keep secretions thin.  Over the counter  medications such as mucinex d to help with symptoms.  Continue with your allergy medications, ibuprofen and tylenol.  If symptoms worsen or do not improve in the next week to return to be seen or to follow up with your PCP.     ED Prescriptions    None     PDMP not reviewed this encounter.   Georgetta Haber, NP 02/24/21 [phone]

## 2021-02-24 NOTE — ED Triage Notes (Signed)
Pt c/o itchy/sore throat, drainage, coughing up green phlegm, ear pain x3 days.

## 2021-03-16 ENCOUNTER — Emergency Department (HOSPITAL_BASED_OUTPATIENT_CLINIC_OR_DEPARTMENT_OTHER)
Admission: EM | Admit: 2021-03-16 | Discharge: 2021-03-16 | Disposition: A | Discharge status: Home / Self Care | Payer: Medicaid Other | Attending: Emergency Medicine | Admitting: Emergency Medicine

## 2021-03-16 ENCOUNTER — Encounter (HOSPITAL_BASED_OUTPATIENT_CLINIC_OR_DEPARTMENT_OTHER): Payer: Self-pay | Admitting: *Deleted

## 2021-03-16 ENCOUNTER — Other Ambulatory Visit: Payer: Self-pay

## 2021-03-16 DIAGNOSIS — M542 Cervicalgia: Secondary | ICD-10-CM | POA: Insufficient documentation

## 2021-03-16 DIAGNOSIS — S299XXA Unspecified injury of thorax, initial encounter: Secondary | ICD-10-CM | POA: Diagnosis present

## 2021-03-16 DIAGNOSIS — S29012A Strain of muscle and tendon of back wall of thorax, initial encounter: Secondary | ICD-10-CM | POA: Insufficient documentation

## 2021-03-16 DIAGNOSIS — M25519 Pain in unspecified shoulder: Secondary | ICD-10-CM | POA: Insufficient documentation

## 2021-03-16 DIAGNOSIS — M546 Pain in thoracic spine: Secondary | ICD-10-CM | POA: Insufficient documentation

## 2021-03-16 DIAGNOSIS — Y92410 Unspecified street and highway as the place of occurrence of the external cause: Secondary | ICD-10-CM | POA: Insufficient documentation

## 2021-03-16 MED ORDER — METHOCARBAMOL 500 MG PO TABS: 500.0000 mg | ORAL_TABLET | Freq: Four times a day (QID) | ORAL | 0 refills | Status: AC | PRN

## 2021-03-16 MED ORDER — IBUPROFEN 800 MG PO TABS: 800.0000 mg | ORAL_TABLET | Freq: Three times a day (TID) | ORAL | 0 refills | Status: AC
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
## 2021-03-16 NOTE — Discharge Instructions (Signed)
1.  Take ibuprofen 800 mg every 8 hours with food.  You may also take Robaxin for muscle relaxer every 4-6 hours as prescribed.
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
## 2021-08-15 ENCOUNTER — Other Ambulatory Visit: Payer: Self-pay

## 2021-08-15 ENCOUNTER — Emergency Department (HOSPITAL_BASED_OUTPATIENT_CLINIC_OR_DEPARTMENT_OTHER)
Admission: EM | Admit: 2021-08-15 | Discharge: 2021-08-15 | Disposition: A | Discharge status: Home / Self Care | Payer: Medicaid Other | Attending: Emergency Medicine | Admitting: Emergency Medicine

## 2021-08-15 ENCOUNTER — Encounter (HOSPITAL_BASED_OUTPATIENT_CLINIC_OR_DEPARTMENT_OTHER): Payer: Self-pay | Admitting: *Deleted

## 2021-08-15 ENCOUNTER — Emergency Department (HOSPITAL_BASED_OUTPATIENT_CLINIC_OR_DEPARTMENT_OTHER): Payer: Medicaid Other

## 2021-08-15 DIAGNOSIS — R0789 Other chest pain: Secondary | ICD-10-CM

## 2021-08-15 DIAGNOSIS — R0781 Pleurodynia: Secondary | ICD-10-CM | POA: Diagnosis present

## 2021-08-15 LAB — CBC WITH DIFFERENTIAL/PLATELET
Abs Immature Granulocytes: 0.01 10*3/uL (ref 0.00–0.07)
Basophils Absolute: 0 10*3/uL (ref 0.0–0.1)
Basophils Relative: 0 %
Eosinophils Absolute: 0.2 10*3/uL (ref 0.0–0.5)
Eosinophils Relative: 3 %
HCT: 34 % — ABNORMAL LOW (ref 36.0–46.0)
Hemoglobin: 11.7 g/dL — ABNORMAL LOW (ref 12.0–15.0)
Immature Granulocytes: 0 %
Lymphocytes Relative: 40 %
Lymphs Abs: 2.3 10*3/uL (ref 0.7–4.0)
MCH: 29.5 pg (ref 26.0–34.0)
MCHC: 34.4 g/dL (ref 30.0–36.0)
MCV: 85.9 fL (ref 80.0–100.0)
Monocytes Absolute: 0.4 10*3/uL (ref 0.1–1.0)
Monocytes Relative: 6 %
Neutro Abs: 2.9 10*3/uL (ref 1.7–7.7)
Neutrophils Relative %: 51 %
Platelets: 340 10*3/uL (ref 150–400)
RBC: 3.96 MIL/uL (ref 3.87–5.11)
RDW: 13.2 % (ref 11.5–15.5)
WBC: 5.8 10*3/uL (ref 4.0–10.5)
nRBC: 0 % (ref 0.0–0.2)

## 2021-08-15 LAB — COMPREHENSIVE METABOLIC PANEL
ALT: 19 U/L (ref 0–44)
AST: 21 U/L (ref 15–41)
Albumin: 4 g/dL (ref 3.5–5.0)
Alkaline Phosphatase: 33 U/L — ABNORMAL LOW (ref 38–126)
Anion gap: 7 (ref 5–15)
BUN: 9 mg/dL (ref 6–20)
CO2: 26 mmol/L (ref 22–32)
Calcium: 9.3 mg/dL (ref 8.9–10.3)
Chloride: 105 mmol/L (ref 98–111)
Creatinine, Ser: 0.78 mg/dL (ref 0.44–1.00)
GFR, Estimated: >60 mL/min (ref >60)
Glucose, Bld: 94 mg/dL (ref 70–99)
Potassium: 4.1 mmol/L (ref 3.5–5.1)
Sodium: 138 mmol/L (ref 135–145)
Total Bilirubin: 0.3 mg/dL (ref 0.3–1.2)
Total Protein: 7 g/dL (ref 6.5–8.1)

## 2021-08-15 LAB — TROPONIN I (HIGH SENSITIVITY): Troponin I (High Sensitivity): <2 ng/L (ref <18)

## 2021-08-15 LAB — PREGNANCY, URINE: Preg Test, Ur: NEGATIVE

## 2021-08-15 LAB — LIPASE, BLOOD: Lipase: 26 U/L (ref 11–51)

## 2021-08-15 MED ORDER — LIDOCAINE VISCOUS HCL 2 % MT SOLN
15.0000 mL | Freq: Once | OROMUCOSAL | Status: AC
  Administered 2021-08-15: 15 mL via ORAL
  Filled 2021-08-15: qty 15

## 2021-08-15 MED ORDER — SUCRALFATE 1 G PO TABS: 1.0000 g | ORAL_TABLET | Freq: Three times a day (TID) | ORAL | 0 refills | Status: AC

## 2021-08-15 MED ORDER — ALUM & MAG HYDROXIDE-SIMETH 200-200-20 MG/5ML PO SUSP
30.0000 mL | Freq: Once | ORAL | Status: AC
  Administered 2021-08-15: 30 mL via ORAL
  Filled 2021-08-15: qty 30

## 2021-08-15 MED ORDER — OMEPRAZOLE 20 MG PO CPDR: 20.0000 mg | DELAYED_RELEASE_CAPSULE | Freq: Every day | ORAL | 0 refills | Status: AC

## 2021-08-15 NOTE — ED Provider Notes (Signed)
[facility] EMERGENCY DEPT Provider Note   CSN: [id number] Arrival date & time: 08/15/21  7124     History Chief Complaint  Patient presents with   Chest Pain   Abdominal Pain    Kellie Davila is a 35 y.o. female.  The history is provided by the patient.  Chest Pain Pain location:  Substernal area Pain quality: aching   Pain radiates to:  Does not radiate Pain severity:  Mild Onset quality:  Gradual Timing:  Constant Progression:  Unchanged Chronicity:  New Context: eating and at rest   Relieved by:  Nothing Worsened by:  Nothing Risk factors: no coronary artery disease, no diabetes mellitus, not pregnant, no prior DVT/PE and no smoking       Past Medical History:  Diagnosis Date   Anemia    Anxiety    worse when on meds   Vaginal Pap smear, abnormal    did bx, ok since    Patient Active Problem List   Diagnosis Date Noted   Bacterial vaginitis 07/05/2017   Abdominal pain affecting pregnancy, antepartum 01/25/2016   Previous cesarean delivery affecting pregnancy, antepartum 01/25/2016   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   Abnormal Pap smear of cervix 12/27/2014    Past Surgical History:  Procedure Laterality Date   CESAREAN SECTION       OB History     Gravida  3   Para  1   Term  1   Preterm      AB  2   Living  1      SAB  0   IAB  2   Ectopic  0   Multiple  0   Live Births  1           Family History  Problem Relation Age of Onset   Allergic rhinitis Mother    Alcohol abuse Neg Hx    Arthritis Neg Hx    Asthma Neg Hx    Birth defects Neg Hx    Cancer Neg Hx    COPD Neg Hx    Depression Neg Hx    Diabetes Neg Hx    Drug abuse Neg Hx    Early death Neg Hx    Hearing loss Neg Hx    Heart disease Neg Hx    Hyperlipidemia Neg Hx    Hypertension Neg Hx    Kidney disease Neg Hx    Learning disabilities Neg Hx    Mental illness Neg Hx    Mental retardation Neg Hx    Miscarriages / Stillbirths Neg Hx     Stroke Neg Hx    Vision loss Neg Hx     Social History   Tobacco Use   Smoking status: Never   Smokeless tobacco: Never  Vaping Use   Vaping Use: Never used  Substance Use Topics   Alcohol use: No   Drug use: No    Home Medications Prior to Admission medications   Medication Sig Start Date End Date Taking? Authorizing Provider  omeprazole (PRILOSEC) 20 MG capsule Take 1 capsule (20 mg total) by mouth daily for 14 days. 08/15/21 08/29/21 Yes Shayla Heming, DO  sucralfate (CARAFATE) 1 g tablet Take 1 tablet (1 g total) by mouth 4 (four) times daily -  with meals and at bedtime for 14 days. 08/15/21 08/29/21 Yes Kenyata Napier, DO  ibuprofen (ADVIL) 600 MG tablet Take 1 tablet (600 mg total) by mouth every 6 (six) hours as needed. 12/04/20  Domenick Gong, MD  ibuprofen (ADVIL) 800 MG tablet Take 1 tablet (800 mg total) by mouth 3 (three) times daily. 03/16/21   Arby Barrette, MD  methocarbamol (ROBAXIN) 500 MG tablet Take 1 tablet (500 mg total) by mouth every 6 (six) hours as needed for muscle spasms. 03/16/21   Arby Barrette, MD  albuterol (VENTOLIN HFA) 108 (90 Base) MCG/ACT inhaler Inhale 1-2 puffs into the lungs every 6 (six) hours as needed for wheezing or shortness of breath. Patient not taking: Reported on 06/17/2019 04/15/19 01/18/20  Wieters, Hallie C, PA-C  levocetirizine (XYZAL) 5 MG tablet Take 1 tablet (5 mg total) by mouth every evening. 01/13/20 12/04/20  Marcelyn Bruins, MD  triamcinolone (NASACORT) 55 MCG/ACT AERO nasal inhaler Place 2 sprays into the nose daily. Patient not taking: Reported on 06/17/2019 02/04/19 01/18/20  Marcelyn Bruins, MD    Allergies    Compazine [prochlorperazine edisylate] and Reglan [metoclopramide]  Review of Systems   Review of Systems  HENT:  Negative for ear pain.   All other systems reviewed and are negative.  Physical Exam Updated Vital Signs BP 119/74   Pulse (!) 54   Temp 98 F (36.7 C)   Resp 17   Ht 5\' 2"   (1.575 m)   Wt 78.9 kg   LMP 08/14/2021   SpO2 100%   BMI 31.83 kg/m   Physical Exam Vitals and nursing note reviewed.  Constitutional:      General: She is not in acute distress.    Appearance: She is well-developed. She is not ill-appearing.  HENT:     Head: Normocephalic and atraumatic.  Eyes:     Extraocular Movements: Extraocular movements intact.     Conjunctiva/sclera: Conjunctivae normal.     Pupils: Pupils are equal, round, and reactive to light.  Cardiovascular:     Rate and Rhythm: Regular rhythm.     Pulses:          Radial pulses are 2+ on the right side and 2+ on the left side.     Heart sounds: Normal heart sounds. No murmur heard. Pulmonary:     Effort: Pulmonary effort is normal. No respiratory distress.     Breath sounds: Normal breath sounds.  Abdominal:     Palpations: Abdomen is soft.     Tenderness: There is no abdominal tenderness.  Musculoskeletal:        General: Normal range of motion.     Cervical back: Normal range of motion and neck supple.     Right lower leg: No edema.     Left lower leg: No edema.  Skin:    General: Skin is warm and dry.     Capillary Refill: Capillary refill takes less than 2 seconds.  Neurological:     General: No focal deficit present.     Mental Status: She is alert.  Psychiatric:        Mood and Affect: Mood normal.    ED Results / Procedures / Treatments   Labs (all labs ordered are listed, but only abnormal results are displayed) Labs Reviewed  CBC WITH DIFFERENTIAL/PLATELET - Abnormal; Notable for the following components:      Result Value   Hemoglobin 11.7 (*)    HCT 34.0 (*)    All other components within normal limits  COMPREHENSIVE METABOLIC PANEL - Abnormal; Notable for the following components:   Alkaline Phosphatase 33 (*)    All other components within normal limits  LIPASE, BLOOD  PREGNANCY,  URINE  TROPONIN I (HIGH SENSITIVITY)    EKG EKG Interpretation  Date/Time:  Tuesday August 15 2021 09:12:04 EDT Ventricular Rate:  58 PR Interval:  124 QRS Duration: 84 QT Interval:  414 QTC Calculation: 407 R Axis:   60 Text Interpretation: Sinus rhythm Confirmed by Virgina Norfolk ([id number]) on 08/15/2021 9:22:16 AM  Radiology DG Chest Portable 1 View  Result Date: 08/15/2021 CLINICAL DATA:  35 year old female with mid sternal and epigastric pain. EXAM: PORTABLE CHEST 1 VIEW COMPARISON:  Chest radiograph 04/15/2019 and earlier. FINDINGS: Portable AP upright view at 0938 hours. Lung volumes and mediastinal contours are stable and within normal limits. Visualized tracheal air column is within normal limits. Allowing for portable technique the lungs are clear. No pneumothorax or pleural effusion. Paucity of bowel gas in the upper abdomen. No osseous abnormality identified. IMPRESSION: Negative portable chest. Electronically Signed   By: Odessa Fleming M.D.   On: 08/15/2021 09:48    Procedures Procedures   Medications Ordered in ED Medications  alum & mag hydroxide-simeth (MAALOX/MYLANTA) 200-200-20 MG/5ML suspension 30 mL (30 mLs Oral Given 08/15/21 0956)    And  lidocaine (XYLOCAINE) 2 % viscous mouth solution 15 mL (15 mLs Oral Given 08/15/21 1601)    ED Course  I have reviewed the triage vital signs and the nursing notes.  Pertinent labs & imaging results that were available during my care of the patient were reviewed by me and considered in my medical decision making (see chart for details).    MDM Rules/Calculators/A&P                           NYEEMAH JENNETTE is here with chest pain.  Normal vitals.  No fever.  PERC negative.  Doubt PE.  No cardiac risk factors. Troponin normal.  EKG shows sinus rhythm.  No concern for ACS as overall unremarkable story.  GI cocktail helped with pain.  Overall suspect acid reflux.  Pain seems to be may be worse with eating.  Gallbladder and liver enzymes within normal limits.  Doubt cholecystitis.  Lipase normal doubt pancreatitis.  X-ray showed no  evidence of pneumonia or pneumothorax.  Will prescribe Prilosec and Carafate and have her follow-up with primary care doctor.  Understands return precautions.  Discharge.  This chart was dictated using voice recognition software.  Despite best efforts to proofread,  errors can occur which can change the documentation meaning.   Final Clinical Impression(s) / ED Diagnoses Final diagnoses:  Atypical chest pain    Rx / DC Orders ED Discharge Orders          Ordered    sucralfate (CARAFATE) 1 g tablet  3 times daily with meals & bedtime        08/15/21 1153    omeprazole (PRILOSEC) 20 MG capsule  Daily        08/15/21 1153             Jatoria Kneeland, Madelaine Bhat, DO 08/15/21 1154

## 2021-08-15 NOTE — Discharge Instructions (Addendum)
Take medications as prescribed for acid reflux.  Follow-up with your primary care doctor.

## 2021-08-15 NOTE — ED Triage Notes (Signed)
Mid sternal/epigastric pain for about a week on and off. Worse with eating salty or greasy foods and lying down. States breast are tender too.

## 2021-09-26 ENCOUNTER — Encounter (HOSPITAL_BASED_OUTPATIENT_CLINIC_OR_DEPARTMENT_OTHER): Payer: Self-pay

## 2021-09-26 ENCOUNTER — Other Ambulatory Visit: Payer: Self-pay

## 2021-09-26 ENCOUNTER — Emergency Department (HOSPITAL_BASED_OUTPATIENT_CLINIC_OR_DEPARTMENT_OTHER)
Admission: EM | Admit: 2021-09-26 | Discharge: 2021-09-26 | Disposition: A | Discharge status: Home / Self Care | Payer: Medicaid Other | Attending: Emergency Medicine | Admitting: Emergency Medicine

## 2021-09-26 DIAGNOSIS — U071 COVID-19: Secondary | ICD-10-CM | POA: Insufficient documentation

## 2021-09-26 DIAGNOSIS — Z20822 Contact with and (suspected) exposure to covid-19: Secondary | ICD-10-CM | POA: Diagnosis not present

## 2021-09-26 DIAGNOSIS — R519 Headache, unspecified: Secondary | ICD-10-CM | POA: Diagnosis present

## 2021-09-26 LAB — RESP PANEL BY RT-PCR (FLU A&B, COVID) ARPGX2
Influenza A by PCR: NEGATIVE
Influenza B by PCR: NEGATIVE
SARS Coronavirus 2 by RT PCR: POSITIVE — AB

## 2021-09-26 MED ORDER — FLUTICASONE PROPIONATE 50 MCG/ACT NA SUSP: 2.0000 | Freq: Every day | NASAL | 2 refills | Status: DC

## 2021-09-26 NOTE — Discharge Instructions (Addendum)
You tested positive for COVID today.  This is likely the cause of your symptoms.  I will write your prescription for fluticasone nasal spray which will help with the congestion and sinus pressure.  I would like for you to also pick up a Nettie pot at the local [facility] or [facility].  DayQuil/NyQuil for congestion and aches and pains. Please follow up with your PCP.   Please return to the emergency room if you experience worsening symptoms, severe and worsening cough, severe fevers, or any other concerns you might have.

## 2021-09-26 NOTE — ED Triage Notes (Signed)
Complains with sore throat, body aches and bilateral ear pain.

## 2021-09-26 NOTE — ED Notes (Signed)
Pt states she has had generalized flu-like symptoms x 2 days, sore throat and bilateral ear pain

## 2021-09-26 NOTE — ED Provider Notes (Signed)
[facility] EMERGENCY DEPT Provider Note   CSN: [id number] Arrival date & time: 09/26/21  1806     History Chief Complaint  Patient presents with   Influenza    [name] is a 35 y.o. female presents the emergency department with a 4-day history of global headache, sinus congestion, postnasal drip, cough, and rhinorrhea.  Significant other has similar symptoms.  She denies any fever, abdominal pain, nausea, vomiting, diarrhea.  She has been taking Mucinex with little improvement.  Her headache is worse with laying down and bending forward.  She describes this as a throbbing and pressure sensation.  She currently rates her headache moderate severity.   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   Abnormal Pap smear of cervix 12/27/2014    Past Surgical History:  Procedure Laterality Date   CESAREAN SECTION       OB History     Gravida  3   Para  1   Term  1   Preterm      AB  2   Living  1      SAB  0   IAB  2   Ectopic  0   Multiple  0   Live Births  1           Family History  Problem Relation Age of Onset   Allergic rhinitis Mother    Alcohol abuse Neg Hx    Arthritis Neg Hx    Asthma Neg Hx    Birth defects Neg Hx    Cancer Neg Hx    COPD Neg Hx    Depression Neg Hx    Diabetes Neg Hx    Drug abuse Neg Hx    Early death Neg Hx    Hearing loss Neg Hx    Heart disease Neg Hx    Hyperlipidemia Neg Hx    Hypertension Neg Hx    Kidney disease Neg Hx    Learning disabilities Neg Hx    Mental illness Neg Hx    Mental retardation Neg Hx    Miscarriages /  Stillbirths Neg Hx    Stroke Neg Hx    Vision loss Neg Hx     Social History   Tobacco Use   Smoking status: Never   Smokeless tobacco: Never  Vaping Use   Vaping Use: Never used  Substance Use Topics   Alcohol use: No   Drug use: No    Home Medications Prior to Admission medications   Medication Sig Start Date End Date Taking? Authorizing Provider  fluticasone (FLONASE) 50 MCG/ACT nasal spray Place 2 sprays into both nostrils daily. 09/26/21  Yes Meredeth Ide, Darreld Hoffer M, PA-C  ibuprofen (ADVIL) 600 MG tablet Take 1 tablet (600 mg total) by mouth every 6 (six) hours as needed. 12/04/20   Domenick Gong, MD  ibuprofen (ADVIL) 800 MG tablet Take 1 tablet (800 mg total) by mouth 3 (three) times daily. 03/16/21   Arby Barrette, MD  methocarbamol (ROBAXIN) 500 MG tablet Take 1 tablet (500 mg total) by mouth every 6 (six) hours  as needed for muscle spasms. 03/16/21   Arby Barrette, MD  omeprazole (PRILOSEC) 20 MG capsule Take 1 capsule (20 mg total) by mouth daily for 14 days. 08/15/21 08/29/21  Curatolo, Adam, DO  sucralfate (CARAFATE) 1 g tablet Take 1 tablet (1 g total) by mouth 4 (four) times daily -  with meals and at bedtime for 14 days. 08/15/21 08/29/21  Curatolo, Adam, DO  albuterol (VENTOLIN HFA) 108 (90 Base) MCG/ACT inhaler Inhale 1-2 puffs into the lungs every 6 (six) hours as needed for wheezing or shortness of breath. Patient not taking: Reported on 06/17/2019 04/15/19 01/18/20  Wieters, Hallie C, PA-C  levocetirizine (XYZAL) 5 MG tablet Take 1 tablet (5 mg total) by mouth every evening. 01/13/20 12/04/20  Marcelyn Bruins, MD  triamcinolone (NASACORT) 55 MCG/ACT AERO nasal inhaler Place 2 sprays into the nose daily. Patient not taking: Reported on 06/17/2019 02/04/19 01/18/20  Marcelyn Bruins, MD    Allergies    Compazine [prochlorperazine edisylate] and Reglan [metoclopramide]  Review of Systems   Review of Systems  All other systems reviewed and are  negative.  Physical Exam Updated Vital Signs BP (!) 131/94 (BP Location: Right Arm)   Pulse 65   Temp 98.3 F (36.8 C)   Resp 18   Ht 5\' 6"  (1.676 m)   Wt 80.3 kg   LMP 09/10/2021   SpO2 100%   BMI 28.57 kg/m   Physical Exam Vitals and nursing note reviewed.  Constitutional:      Appearance: Normal appearance.  HENT:     Head: Normocephalic and atraumatic.  Eyes:     General:        Right eye: No discharge.        Left eye: No discharge.     Conjunctiva/sclera: Conjunctivae normal.  Cardiovascular:     Rate and Rhythm: Normal rate and regular rhythm.  Pulmonary:     Effort: Pulmonary effort is normal. No respiratory distress.     Breath sounds: Normal breath sounds.  Skin:    General: Skin is warm and dry.     Findings: No rash.  Neurological:     General: No focal deficit present.     Mental Status: She is alert.  Psychiatric:        Mood and Affect: Mood normal.        Behavior: Behavior normal.    ED Results / Procedures / Treatments   Labs (all labs ordered are listed, but only abnormal results are displayed) Labs Reviewed  RESP PANEL BY RT-PCR (FLU A&B, COVID) ARPGX2 - Abnormal; Notable for the following components:      Result Value   SARS Coronavirus 2 by RT PCR POSITIVE (*)    All other components within normal limits    EKG None  Radiology No results found.  Procedures Procedures   Medications Ordered in ED Medications - No data to display  ED Course  I have reviewed the triage vital signs and the nursing notes.  Pertinent labs & imaging results that were available during my care of the patient were reviewed by me and considered in my medical decision making (see chart for details).    MDM Rules/Calculators/A&P                          Kellie Davila is a 35 y.o. female who presents to the emergency department for evaluation of viral upper respiratory symptoms.  Patient tested positive  for COVID.  This is likely as cause of her  symptoms.  We discussed at length over-the-counter remedies.  I will also prescribe her fluticasone nasal spray for nasal congestion and sinus pressure.  Patient and significant other expressed full understanding.  All questions or concerns addressed.  She is currently resting comfortably in the emergency department in no respiratory distress.  Vital signs are stable.  She is safe for discharge.  I will have her follow-up with her primary care provider for further evaluation.   Final Clinical Impression(s) / ED Diagnoses Final diagnoses:  COVID    Rx / DC Orders ED Discharge Orders          Ordered    fluticasone (FLONASE) 50 MCG/ACT nasal spray  Daily        09/26/21 2146             Teressa Lower, PA-C 09/26/21 2146    Vanetta Mulders, MD 09/28/21 1920

## 2022-01-08 ENCOUNTER — Encounter (HOSPITAL_BASED_OUTPATIENT_CLINIC_OR_DEPARTMENT_OTHER): Payer: Self-pay | Admitting: Emergency Medicine

## 2022-01-08 ENCOUNTER — Emergency Department (HOSPITAL_BASED_OUTPATIENT_CLINIC_OR_DEPARTMENT_OTHER)
Admission: EM | Admit: 2022-01-08 | Discharge: 2022-01-08 | Disposition: A | Discharge status: Home / Self Care | Payer: Medicaid Other | Attending: Emergency Medicine | Admitting: Emergency Medicine

## 2022-01-08 ENCOUNTER — Emergency Department (HOSPITAL_BASED_OUTPATIENT_CLINIC_OR_DEPARTMENT_OTHER): Payer: Medicaid Other | Admitting: Radiology

## 2022-01-08 ENCOUNTER — Other Ambulatory Visit: Payer: Self-pay

## 2022-01-08 DIAGNOSIS — M25571 Pain in right ankle and joints of right foot: Secondary | ICD-10-CM

## 2022-01-08 DIAGNOSIS — M7989 Other specified soft tissue disorders: Secondary | ICD-10-CM | POA: Diagnosis not present

## 2022-01-08 MED ORDER — IBUPROFEN 400 MG PO TABS
400.0000 mg | ORAL_TABLET | Freq: Once | ORAL | Status: AC
  Administered 2022-01-08: 400 mg via ORAL
  Filled 2022-01-08: qty 1

## 2022-01-08 NOTE — ED Notes (Signed)
RN provided AVS using Teachback Method. Patient verbalizes understanding of Discharge Instructions. Opportunity for Questioning and Answers were provided by RN. Patient Discharged from ED ambulatory to Home via Self.  

## 2022-01-08 NOTE — ED Triage Notes (Signed)
Pt ambulated to triage with steady gait. Pt states that right ankle has been swollen x 1 week and started to throb with pain 2 days ago. Denies any injury.  ?

## 2022-01-08 NOTE — Discharge Instructions (Signed)
You are seen in the ER today for ankle pain.  There is nothing broken or dislocated in your ankle.  Suspect mild sprain of your ankle.  Follow-up with your primary care doctor.  Rest the area, ice it, wear an Ace bandage for compression, and follow-up with your primary care doctor.  Return to the ER with any new severe symptoms. ?

## 2022-01-08 NOTE — ED Notes (Signed)
Affected Patient Ankle wrapped as requested. Good Capillary Refill post-application. Patient ambulatory with no complaints. ?

## 2022-01-08 NOTE — ED Notes (Signed)
XRAY at the Bedside. ?

## 2022-01-08 NOTE — ED Notes (Signed)
PA at the Bedside. ?

## 2022-01-08 NOTE — ED Provider Notes (Cosign Needed Addendum)
[facility] Provider Note   CSN: [id number] Arrival date & time: 01/08/22  1240     History  Chief Complaint  Patient presents with   Ankle Pain    Kellie Davila is a 36 y.o. female who presents with one week of right ankle swelling and soreness without known injury. She denies any redness or warmth to the touch - swelling only over lateral ankle without any progression up the calf or any calf pain.   I have personally reviewed this patient's medical records. She has hisotry of anxiety, cerebellar cyst. She is not on any medications daily.  No history of DVT or malignancy, no recent travel, prolonged immobilization, surgical procedure, or hormone replacement.  HPI     Home Medications Prior to Admission medications   Medication Sig Start Date End Date Taking? Authorizing Provider  fluticasone (FLONASE) 50 MCG/ACT nasal spray Place 2 sprays into both nostrils daily. 09/26/21   Honor LohFleming, Conner M, PA-C  ibuprofen (ADVIL) 600 MG tablet Take 1 tablet (600 mg total) by mouth every 6 (six) hours as needed. 12/04/20   Domenick GongMortenson, Ashley, MD  ibuprofen (ADVIL) 800 MG tablet Take 1 tablet (800 mg total) by mouth 3 (three) times daily. 03/16/21   Arby BarrettePfeiffer, Marcy, MD  methocarbamol (ROBAXIN) 500 MG tablet Take 1 tablet (500 mg total) by mouth every 6 (six) hours as needed for muscle spasms. 03/16/21   Arby BarrettePfeiffer, Marcy, MD  omeprazole (PRILOSEC) 20 MG capsule Take 1 capsule (20 mg total) by mouth daily for 14 days. 08/15/21 08/29/21  Curatolo, Adam, DO  sucralfate (CARAFATE) 1 g tablet Take 1 tablet (1 g total) by mouth 4 (four) times daily -  with meals and at bedtime for 14 days. 08/15/21 08/29/21  Curatolo, Adam, DO  albuterol (VENTOLIN HFA) 108 (90 Base) MCG/ACT inhaler Inhale 1-2 puffs into the lungs every 6 (six) hours as needed for wheezing or shortness of breath. Patient not taking: Reported on 06/17/2019 04/15/19 01/18/20  Wieters, Hallie C, PA-C  levocetirizine  (XYZAL) 5 MG tablet Take 1 tablet (5 mg total) by mouth every evening. 01/13/20 12/04/20  Marcelyn BruinsPadgett, Shaylar Patricia, MD  triamcinolone (NASACORT) 55 MCG/ACT AERO nasal inhaler Place 2 sprays into the nose daily. Patient not taking: Reported on 06/17/2019 02/04/19 01/18/20  Marcelyn BruinsPadgett, Shaylar Patricia, MD      Allergies    Compazine [prochlorperazine edisylate] and Reglan [metoclopramide]    Review of Systems   Review of Systems  Musculoskeletal:        R ankle pain   Physical Exam Updated Vital Signs BP 119/75    Pulse 67    Temp 97.6 F (36.4 C) (Temporal)    Resp 18    Ht 5\' 2"  (1.575 m)    Wt 81.6 kg    LMP 01/06/2022 (Exact Date)    SpO2 100%    BMI 32.92 kg/m  Physical Exam Vitals and nursing note reviewed.  HENT:     Head: Normocephalic and atraumatic.  Eyes:     General: No scleral icterus.       Right eye: No discharge.        Left eye: No discharge.     Conjunctiva/sclera: Conjunctivae normal.  Pulmonary:     Effort: Pulmonary effort is normal.  Musculoskeletal:     Right knee: Normal.     Left knee: Normal.     Right lower leg: Normal. No swelling or tenderness. No edema.     Left lower leg: Normal.  No swelling or tenderness. No edema.     Right ankle: Normal.     Right Achilles Tendon: Normal.     Left ankle: Normal.     Left Achilles Tendon: Normal.     Right foot: Normal capillary refill. Swelling and tenderness present.     Left foot: Normal.       Feet:     Comments: FROM of the right ankle actively and passively without pain.   Feet:     Comments: Normal cap refill in all toes of the right foot.  Symmetric DP pulses bilaterally.  Skin:    General: Skin is warm and dry.     Capillary Refill: Capillary refill takes less than 2 seconds.  Neurological:     General: No focal deficit present.     Mental Status: She is alert.     GCS: GCS eye subscore is 4. GCS verbal subscore is 5. GCS motor subscore is 6.     Gait: Gait is intact.  Psychiatric:        Mood and  Affect: Mood normal.    ED Results / Procedures / Treatments   Labs (all labs ordered are listed, but only abnormal results are displayed) Labs Reviewed - No data to display  EKG None  Radiology DG Ankle Complete Right  Result Date: 01/08/2022 CLINICAL DATA:  Pain and swelling. Right lateral ankle pain and swelling for 1 week. EXAM: RIGHT ANKLE - COMPLETE 3+ VIEW COMPARISON:  Right ankle radiographs 12/27/2008 FINDINGS: Mild lateral malleolar soft tissue swelling, minimally less compared to prior remote 12/27/2008 radiographs. Mild anterior ankle soft tissue swelling. The ankle mortise is symmetric and intact. No acute fracture or dislocation. Tiny calcaneal heel spur is new from prior. IMPRESSION: Mild lateral and anterior ankle soft tissue swelling. No acute fracture is seen. Electronically Signed   By: Neita Garnet M.D.   On: 01/08/2022 13:57    Procedures Procedures    Medications Ordered in ED Medications  ibuprofen (ADVIL) tablet 400 mg (400 mg Oral Given 01/08/22 1340)    ED Course/ Medical Decision Making/ A&P                           Medical Decision Making 36 year old female who presents with concern for right ankle pain and swelling x 1 week.   There is concern on intake.  Patient is neurovascular intact in the foot with mild swelling over the lateral malleolus without evidence of trauma or skin changes  Amount and/or Complexity of Data Reviewed Radiology: ordered.    Details: Images visualized by this provider.  No acute fracture or dislocation others tiny calcaneal heel spur. Mild soft tissue swelling over the lateral ankle.  Risk Prescription drug management.   No acute fracture or dislocation on physical exam or work-up.  The exact etiology of this patient symptoms remains unclear suspect mild sprain of the joint.  Recommend ice, resting the joint, compression with Ace wrap provided in the emergency department and follow-up with PCP recommended.  Myrl  voiced understanding of her medical evaluation and treatment plan.  Each of her questions was answered to her expressed satisfaction.  Return precautions were given.  Patient is well-appearing, stable, and was discharged in good condition.  This chart was dictated using voice recognition software, Dragon. Despite the best efforts of this provider to proofread and correct errors, errors may still occur which can change documentation meaning.   Final  Clinical Impression(s) / ED Diagnoses Final diagnoses:  None    Rx / DC Orders ED Discharge Orders     None        Paris Lore, PA-C 01/08/22 1412

## 2022-03-19 ENCOUNTER — Other Ambulatory Visit: Payer: Self-pay

## 2022-03-19 ENCOUNTER — Encounter (HOSPITAL_BASED_OUTPATIENT_CLINIC_OR_DEPARTMENT_OTHER): Payer: Self-pay

## 2022-03-19 ENCOUNTER — Emergency Department (HOSPITAL_BASED_OUTPATIENT_CLINIC_OR_DEPARTMENT_OTHER)
Admission: EM | Admit: 2022-03-19 | Discharge: 2022-03-19 | Disposition: A | Discharge status: Home / Self Care | Payer: Medicaid Other | Attending: Emergency Medicine | Admitting: Emergency Medicine

## 2022-03-19 DIAGNOSIS — L243 Irritant contact dermatitis due to cosmetics: Secondary | ICD-10-CM | POA: Diagnosis not present

## 2022-03-19 DIAGNOSIS — R21 Rash and other nonspecific skin eruption: Secondary | ICD-10-CM | POA: Diagnosis present

## 2022-03-19 MED ORDER — FAMOTIDINE 20 MG PO TABS: 20.0000 mg | ORAL_TABLET | Freq: Two times a day (BID) | ORAL | 0 refills | Status: AC

## 2022-03-19 MED ORDER — PREDNISONE 50 MG PO TABS
60.0000 mg | ORAL_TABLET | Freq: Once | ORAL | Status: AC
  Administered 2022-03-19: 60 mg via ORAL
  Filled 2022-03-19: qty 1

## 2022-03-19 MED ORDER — FAMOTIDINE 20 MG PO TABS
20.0000 mg | ORAL_TABLET | Freq: Once | ORAL | Status: AC
  Administered 2022-03-19: 20 mg via ORAL
  Filled 2022-03-19: qty 1

## 2022-03-19 MED ORDER — PREDNISONE 10 MG PO TABS: 20.0000 mg | ORAL_TABLET | Freq: Every day | ORAL | 0 refills | Status: AC

## 2022-03-19 MED ORDER — DIPHENHYDRAMINE HCL 25 MG PO CAPS
25.0000 mg | ORAL_CAPSULE | Freq: Once | ORAL | Status: AC
  Administered 2022-03-19: 25 mg via ORAL
  Filled 2022-03-19: qty 1

## 2022-03-19 NOTE — Discharge Instructions (Signed)
Please pick up medications and take as prescribed. Follow up with your PCP as scheduled for tomorrow.   It is recommended that you wash all of your bedding, towels, etc in case there is any product left on these areas causing irritation. It is also recommended that you wash hair once more in case there is any product left.   Return to the ED for any new/worsening symptoms

## 2022-03-19 NOTE — ED Provider Notes (Signed)
[facility] EMERGENCY DEPT Provider Note   CSN: [id number] Arrival date & time: 03/19/22  1105     History  Chief Complaint  Patient presents with   Allergic Reaction    Kellie Davila is a 36 y.o. female who presents to the ED today with complaint of allergic reaction. Pt reports she had a weave placed about 1 week ago. She was unaware that the hair salon used a product that she is allergic to. She began noticing burning/itching to her scalp and removed the weave. She has since washed her hair to try to get the remainder of the product out however had noticed worsening burning to her neck, hair line, upper arms, and chest. She has been taking 25 mg Benadryl daily without relief. No other complaints at this time.   The history is provided by the patient and medical records.      Home Medications Prior to Admission medications   Medication Sig Start Date End Date Taking? Authorizing Provider  famotidine (PEPCID) 20 MG tablet Take 1 tablet (20 mg total) by mouth 2 (two) times daily. 03/19/22  Yes Claudette Wermuth, PA-C  predniSONE (DELTASONE) 10 MG tablet Take 2 tablets (20 mg total) by mouth daily for 5 days. 03/19/22 03/24/22 Yes Paislie Tessler, PA-C  fluticasone (FLONASE) 50 MCG/ACT nasal spray Place 2 sprays into both nostrils daily. 09/26/21   Honor Loh M, PA-C  ibuprofen (ADVIL) 600 MG tablet Take 1 tablet (600 mg total) by mouth every 6 (six) hours as needed. 12/04/20   Domenick Gong, MD  ibuprofen (ADVIL) 800 MG tablet Take 1 tablet (800 mg total) by mouth 3 (three) times daily. 03/16/21   Arby Barrette, MD  methocarbamol (ROBAXIN) 500 MG tablet Take 1 tablet (500 mg total) by mouth every 6 (six) hours as needed for muscle spasms. 03/16/21   Arby Barrette, MD  omeprazole (PRILOSEC) 20 MG capsule Take 1 capsule (20 mg total) by mouth daily for 14 days. 08/15/21 08/29/21  Curatolo, Adam, DO  sucralfate (CARAFATE) 1 g tablet Take 1 tablet (1 g total) by mouth 4  (four) times daily -  with meals and at bedtime for 14 days. 08/15/21 08/29/21  Curatolo, Adam, DO  albuterol (VENTOLIN HFA) 108 (90 Base) MCG/ACT inhaler Inhale 1-2 puffs into the lungs every 6 (six) hours as needed for wheezing or shortness of breath. Patient not taking: Reported on 06/17/2019 04/15/19 01/18/20  Wieters, Hallie C, PA-C  levocetirizine (XYZAL) 5 MG tablet Take 1 tablet (5 mg total) by mouth every evening. 01/13/20 12/04/20  Marcelyn Bruins, MD  triamcinolone (NASACORT) 55 MCG/ACT AERO nasal inhaler Place 2 sprays into the nose daily. Patient not taking: Reported on 06/17/2019 02/04/19 01/18/20  Marcelyn Bruins, MD      Allergies    Compazine [prochlorperazine edisylate] and Reglan [metoclopramide]    Review of Systems   Review of Systems  Constitutional:  Negative for chills and fever.  HENT:  Negative for sore throat and trouble swallowing.   Respiratory:  Negative for shortness of breath and wheezing.   Skin:  Negative for rash.       + burning/itching  All other systems reviewed and are negative.  Physical Exam Updated Vital Signs BP 115/71 (BP Location: Right Arm)   Pulse (!) 58   Temp 97.9 F (36.6 C)   Resp 16   Ht 5\' 2"  (1.575 m)   Wt 81.6 kg   SpO2 100%   BMI 32.90 kg/m  Physical Exam  Vitals and nursing note reviewed.  Constitutional:      Appearance: She is not ill-appearing or diaphoretic.  HENT:     Head: Normocephalic and atraumatic.  Eyes:     Conjunctiva/sclera: Conjunctivae normal.  Cardiovascular:     Rate and Rhythm: Normal rate and regular rhythm.     Pulses: Normal pulses.  Pulmonary:     Effort: Pulmonary effort is normal.     Breath sounds: Normal breath sounds. No wheezing, rhonchi or rales.  Skin:    General: Skin is warm and dry.     Coloration: Skin is not jaundiced.     Findings: No rash.     Comments: No obvious rash appreciated to areas of burning  Neurological:     Mental Status: She is alert.    ED Results  / Procedures / Treatments   Labs (all labs ordered are listed, but only abnormal results are displayed) Labs Reviewed - No data to display  EKG None  Radiology No results found.  Procedures Procedures    Medications Ordered in ED Medications  diphenhydrAMINE (BENADRYL) capsule 25 mg (25 mg Oral Given 03/19/22 1224)  famotidine (PEPCID) tablet 20 mg (20 mg Oral Given 03/19/22 1224)  predniSONE (DELTASONE) tablet 60 mg (60 mg Oral Given 03/19/22 1224)    ED Course/ Medical Decision Making/ A&P                           Medical Decision Making 36 year old female presenting to the ED today with complaint of allergic reaction s/2 hair product 1 week ago that she has since tried to wash out. Taking Benadryl daily without relief. On arrival to the ED VSS. Pt appears to be in NAD. No urticarial rash appreciated to areas of burning however areas are nape of neck, hair line, upper arms, and chest where she describes the hair product touching while washing out in the shower. Suspect chemical dermatitis at this time. Will provide additional benadryl, pepcid, and prednisone and reevaluate.   On reeval pt resting comfortably. Reports mild improvement in symptoms. Will discharge home with additional prednisone and pepcid. Pt encouraged to continue using benadryl. I suspect chemical/contact dermatitis causing some irritation to her skin. Have recommended additional wash out of her hair and washing/changing out sheets, towels, etc in case there is any additional product left on these areas. She has appt with PCP tomorrow; advised to keep. Otherwise stable for discharge home.   Problems Addressed: Irritant contact dermatitis due to cosmetics: acute illness or injury          Final Clinical Impression(s) / ED Diagnoses Final diagnoses:  Irritant contact dermatitis due to cosmetics    Rx / DC Orders ED Discharge Orders          Ordered    predniSONE (DELTASONE) 10 MG tablet  Daily         03/19/22 1300    famotidine (PEPCID) 20 MG tablet  2 times daily        03/19/22 1300             Discharge Instructions      Please pick up medications and take as prescribed. Follow up with your PCP as scheduled for tomorrow.   It is recommended that you wash all of your bedding, towels, etc in case there is any product left on these areas causing irritation. It is also recommended that you wash hair once more in case  there is any product left.   Return to the ED for any new/worsening symptoms       Tanda Rockers, Cordelia Poche 03/19/22 1301    Cheryll Cockayne, MD 03/23/22 [phone]

## 2022-03-19 NOTE — ED Notes (Signed)
Pt. Said her synthetic hair is causing an allergic reaction to head, shoulders, and neck.

## 2022-03-19 NOTE — ED Triage Notes (Signed)
Patient here POV from Home.  Endorses having Synthetic Hair placed 1 Week PTA and recently washing it and removing it 2 days PTA.  Endorses Burning to Neck, Bilateral Shoulders, and Upper Back. No Rash. Using Benadryl without much Relief.  Endorses being allergic to Brand of Hair.   NAD Noted during Triage. A&Ox4. GCS 15. Ambulatory

## 2022-03-21 ENCOUNTER — Emergency Department (HOSPITAL_BASED_OUTPATIENT_CLINIC_OR_DEPARTMENT_OTHER)
Admission: EM | Admit: 2022-03-21 | Discharge: 2022-03-21 | Disposition: A | Discharge status: Home / Self Care | Payer: Medicaid Other | Attending: Emergency Medicine | Admitting: Emergency Medicine

## 2022-03-21 ENCOUNTER — Other Ambulatory Visit: Payer: Self-pay

## 2022-03-21 DIAGNOSIS — T7840XA Allergy, unspecified, initial encounter: Secondary | ICD-10-CM

## 2022-03-21 DIAGNOSIS — R220 Localized swelling, mass and lump, head: Secondary | ICD-10-CM

## 2022-03-21 MED ORDER — EPINEPHRINE 0.3 MG/0.3ML IJ SOAJ: 0.3000 mg | INTRAMUSCULAR | 0 refills | Status: AC | PRN

## 2022-03-21 NOTE — Discharge Instructions (Addendum)
Your history and exam are consistent with your improving allergic reaction from the hair product but likely also have a degree of seasonal allergies with your face.  We discussed using EpiPen at this time but do not feel it is warranted currently.  Given your improving symptoms, we recommend continuing the rest of the course of the steroids and using some antihistamines like Benadryl at home as well.  Please follow-up with your PCP for more formal allergy testing and rest.  Please also fill the prescription for EpiPen in case symptoms worsen.  If any symptoms change or worsen acutely, please return to the nearest emergency department.
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
## 2022-03-21 NOTE — ED Notes (Signed)
Pt at desk, does not want VS repeated, has to pick up son. DC paperwork has been reviewed with pt and she verbalizes understanding

## 2022-03-21 NOTE — ED Notes (Signed)
Pt at desk and states she has to leave.  Pt has been seen by EDP, paperwork printing now

## 2022-03-21 NOTE — ED Triage Notes (Signed)
Pt arrived POV c/o facial swelling. Was seen here on 05/22 for an allergic reaction and was given Prednisone. Seen PCP yesterday for follow up and was given steroid injection and increased steroid dosage. Per pt, she took prednisone today and started noticing puffiness and redness around the eye.  Pt talking in full sentences, NAD noted in triage.

## 2022-03-21 NOTE — ED Notes (Signed)
Dx with allergic reaction on 5/22, treated here.  Saw PCP yesterday and given steroid injection and prednisone was increased.   Noticed today after taking prednisone face under eyes became swollen and red.   No redness noted at present time.  Pt also c/o headaches since yesterday after prednisone was increased

## 2022-06-06 ENCOUNTER — Encounter (HOSPITAL_BASED_OUTPATIENT_CLINIC_OR_DEPARTMENT_OTHER): Payer: Self-pay

## 2022-06-06 ENCOUNTER — Emergency Department (HOSPITAL_BASED_OUTPATIENT_CLINIC_OR_DEPARTMENT_OTHER)
Admission: EM | Admit: 2022-06-06 | Discharge: 2022-06-06 | Disposition: A | Discharge status: Home / Self Care | Payer: Medicaid Other | Attending: Emergency Medicine | Admitting: Emergency Medicine

## 2022-06-06 DIAGNOSIS — R519 Headache, unspecified: Secondary | ICD-10-CM | POA: Diagnosis not present

## 2022-06-06 DIAGNOSIS — Z20822 Contact with and (suspected) exposure to covid-19: Secondary | ICD-10-CM | POA: Diagnosis not present

## 2022-06-06 DIAGNOSIS — R197 Diarrhea, unspecified: Secondary | ICD-10-CM | POA: Insufficient documentation

## 2022-06-06 DIAGNOSIS — R112 Nausea with vomiting, unspecified: Secondary | ICD-10-CM | POA: Diagnosis present

## 2022-06-06 DIAGNOSIS — K529 Noninfective gastroenteritis and colitis, unspecified: Secondary | ICD-10-CM

## 2022-06-06 LAB — URINALYSIS, ROUTINE W REFLEX MICROSCOPIC
Bilirubin Urine: NEGATIVE
Glucose, UA: NEGATIVE mg/dL
Ketones, ur: NEGATIVE mg/dL
Leukocytes,Ua: NEGATIVE
Nitrite: NEGATIVE
Protein, ur: NEGATIVE mg/dL
Specific Gravity, Urine: 1.02 (ref 1.005–1.030)
pH: 6 (ref 5.0–8.0)

## 2022-06-06 LAB — PREGNANCY, URINE: Preg Test, Ur: NEGATIVE

## 2022-06-06 LAB — COMPREHENSIVE METABOLIC PANEL
ALT: 19 U/L (ref 0–44)
AST: 19 U/L (ref 15–41)
Albumin: 4.4 g/dL (ref 3.5–5.0)
Alkaline Phosphatase: 33 U/L — ABNORMAL LOW (ref 38–126)
Anion gap: 8 (ref 5–15)
BUN: 8 mg/dL (ref 6–20)
CO2: 26 mmol/L (ref 22–32)
Calcium: 9.1 mg/dL (ref 8.9–10.3)
Chloride: 106 mmol/L (ref 98–111)
Creatinine, Ser: 0.85 mg/dL (ref 0.44–1.00)
GFR, Estimated: >60 mL/min (ref >60)
Glucose, Bld: 96 mg/dL (ref 70–99)
Potassium: 3 mmol/L — ABNORMAL LOW (ref 3.5–5.1)
Sodium: 140 mmol/L (ref 135–145)
Total Bilirubin: 0.4 mg/dL (ref 0.3–1.2)
Total Protein: 7.4 g/dL (ref 6.5–8.1)

## 2022-06-06 LAB — CBC
HCT: 32.9 % — ABNORMAL LOW (ref 36.0–46.0)
Hemoglobin: 11.3 g/dL — ABNORMAL LOW (ref 12.0–15.0)
MCH: 29.4 pg (ref 26.0–34.0)
MCHC: 34.3 g/dL (ref 30.0–36.0)
MCV: 85.5 fL (ref 80.0–100.0)
Platelets: 332 10*3/uL (ref 150–400)
RBC: 3.85 MIL/uL — ABNORMAL LOW (ref 3.87–5.11)
RDW: 13.9 % (ref 11.5–15.5)
WBC: 5.2 10*3/uL (ref 4.0–10.5)
nRBC: 0 % (ref 0.0–0.2)

## 2022-06-06 LAB — URINALYSIS, MICROSCOPIC (REFLEX)
Bacteria, UA: NONE SEEN
WBC, UA: NONE SEEN WBC/hpf (ref 0–5)

## 2022-06-06 LAB — LIPASE, BLOOD: Lipase: <10 U/L — ABNORMAL LOW (ref 11–51)

## 2022-06-06 LAB — SARS CORONAVIRUS 2 BY RT PCR: SARS Coronavirus 2 by RT PCR: NEGATIVE

## 2022-06-06 MED ORDER — POTASSIUM CHLORIDE CRYS ER 20 MEQ PO TBCR
60.0000 meq | EXTENDED_RELEASE_TABLET | Freq: Once | ORAL | Status: AC
  Administered 2022-06-06: 60 meq via ORAL
  Filled 2022-06-06: qty 3

## 2022-06-06 MED ORDER — ONDANSETRON HCL 4 MG PO TABS: 4.0000 mg | ORAL_TABLET | Freq: Every day | ORAL | 0 refills | Status: AC | PRN

## 2022-06-06 MED ORDER — LACTATED RINGERS IV BOLUS
1000.0000 mL | Freq: Once | INTRAVENOUS | Status: AC
  Administered 2022-06-06: 1000 mL via INTRAVENOUS

## 2022-06-06 MED ORDER — ONDANSETRON HCL 4 MG/2ML IJ SOLN
4.0000 mg | Freq: Once | INTRAMUSCULAR | Status: DC
  Filled 2022-06-06: qty 2

## 2022-06-06 MED ORDER — ONDANSETRON 4 MG PO TBDP
4.0000 mg | ORAL_TABLET | Freq: Once | ORAL | Status: AC
Start: 2022-06-06 — End: 2022-06-06
  Administered 2022-06-06: 4 mg via ORAL
  Filled 2022-06-06: qty 1

## 2022-06-06 MED ORDER — FLUTICASONE PROPIONATE 50 MCG/ACT NA SUSP: 2.0000 | Freq: Every day | NASAL | 0 refills | Status: AC

## 2022-06-06 MED ORDER — LORATADINE 10 MG PO TABS
10.0000 mg | ORAL_TABLET | Freq: Once | ORAL | Status: AC
  Administered 2022-06-06: 10 mg via ORAL
  Filled 2022-06-06: qty 1

## 2022-06-06 MED ORDER — FAMOTIDINE 20 MG PO TABS
20.0000 mg | ORAL_TABLET | Freq: Once | ORAL | Status: AC
  Administered 2022-06-06: 20 mg via ORAL
  Filled 2022-06-06: qty 1

## 2022-06-06 MED ORDER — AZELASTINE HCL 0.1 % NA SOLN: 2.0000 | Freq: Two times a day (BID) | NASAL | 0 refills | Status: AC

## 2022-06-06 MED ORDER — FAMOTIDINE IN NACL 20-0.9 MG/50ML-% IV SOLN
20.0000 mg | Freq: Once | INTRAVENOUS | Status: DC
  Filled 2022-06-06: qty 50

## 2022-06-06 NOTE — ED Triage Notes (Signed)
Pt presents POV from home for headache, face pressure and vomiting x1 this morning.  Pt thinks she has food poisoning

## 2022-06-06 NOTE — ED Provider Notes (Signed)
[facility] EMERGENCY DEPT Provider Note   CSN: [id number] Arrival date & time: 06/06/22  0845     History  Chief Complaint  Patient presents with   Headache   Emesis    Kellie Davila is a 36 y.o. female with hx of seasonal allergies and recurrent sinus infections presenting to the ED for complaint of n/v/d after ingestion of suspicious food.  Patient states yesterday she ate some crab legs and feeling sick since then.  She is also been having sinus congestion which is a chronic issue for her.  She states this has been bothering her but appears to be worse last few days.  She is denying any fevers or chills.  She denies any abdominal pain, back pain, joint or muscle pain.   Headache Associated symptoms: diarrhea, nausea, sinus pressure and vomiting   Associated symptoms: no abdominal pain, no back pain, no eye pain, no fatigue, no fever, no numbness and no sore throat   Emesis Associated symptoms: diarrhea and headaches   Associated symptoms: no abdominal pain, no arthralgias, no chills, no fever and no sore throat        Home Medications Prior to Admission medications   Medication Sig Start Date End Date Taking? Authorizing Provider  EPINEPHrine 0.3 mg/0.3 mL IJ SOAJ injection Inject 0.3 mg into the muscle as needed for anaphylaxis. 03/21/22   Tegeler, Canary Brim, MD  famotidine (PEPCID) 20 MG tablet Take 1 tablet (20 mg total) by mouth 2 (two) times daily. 03/19/22   Hyman Hopes, Margaux, PA-C  fluticasone (FLONASE) 50 MCG/ACT nasal spray Place 2 sprays into both nostrils daily. 09/26/21   Honor Loh M, PA-C  ibuprofen (ADVIL) 600 MG tablet Take 1 tablet (600 mg total) by mouth every 6 (six) hours as needed. 12/04/20   Domenick Gong, MD  ibuprofen (ADVIL) 800 MG tablet Take 1 tablet (800 mg total) by mouth 3 (three) times daily. 03/16/21   Arby Barrette, MD  methocarbamol (ROBAXIN) 500 MG tablet Take 1 tablet (500 mg total) by mouth every 6 (six)  hours as needed for muscle spasms. 03/16/21   Arby Barrette, MD  omeprazole (PRILOSEC) 20 MG capsule Take 1 capsule (20 mg total) by mouth daily for 14 days. 08/15/21 08/29/21  Curatolo, Adam, DO  sucralfate (CARAFATE) 1 g tablet Take 1 tablet (1 g total) by mouth 4 (four) times daily -  with meals and at bedtime for 14 days. 08/15/21 08/29/21  Curatolo, Adam, DO  albuterol (VENTOLIN HFA) 108 (90 Base) MCG/ACT inhaler Inhale 1-2 puffs into the lungs every 6 (six) hours as needed for wheezing or shortness of breath. Patient not taking: Reported on 06/17/2019 04/15/19 01/18/20  Wieters, Hallie C, PA-C  levocetirizine (XYZAL) 5 MG tablet Take 1 tablet (5 mg total) by mouth every evening. 01/13/20 12/04/20  Marcelyn Bruins, MD  triamcinolone (NASACORT) 55 MCG/ACT AERO nasal inhaler Place 2 sprays into the nose daily. Patient not taking: Reported on 06/17/2019 02/04/19 01/18/20  Marcelyn Bruins, MD      Allergies    Compazine [prochlorperazine edisylate] and Reglan [metoclopramide]    Review of Systems   Review of Systems  Constitutional:  Negative for activity change, chills, fatigue and fever.  HENT:  Positive for sinus pressure. Negative for sneezing, sore throat and tinnitus.   Eyes:  Negative for pain, discharge, redness and itching.  Respiratory:  Negative for choking, chest tightness and shortness of breath.   Cardiovascular:  Negative for chest pain and  leg swelling.  Gastrointestinal:  Positive for diarrhea, nausea and vomiting. Negative for abdominal distention and abdominal pain.  Endocrine: Negative for cold intolerance and heat intolerance.  Genitourinary:  Negative for difficulty urinating and dyspareunia.  Musculoskeletal:  Negative for arthralgias and back pain.  Skin:  Negative for color change and pallor.  Neurological:  Positive for headaches. Negative for light-headedness and numbness.  Psychiatric/Behavioral:  Negative for agitation and behavioral problems.      Physical Exam Updated Vital Signs BP 106/72   Pulse (!) 52   Temp 98.4 F (36.9 C)   Resp 16   SpO2 100%  Physical Exam: Gen: NAD, pleasant young female HENT: Nasal polyps in bilateral nares, dry MM, TTP of sinus with gentle palpation CV: RRR, normal pulses Resp: CTAB Abd: No TTP, normal bowel sounds MSK: No asymmetry, normal bulk and tone Skin: No lesions on exposed skin Neuro: alert and oriented x4 Psych: Normal mood and normal affect  ED Results / Procedures / Treatments   Labs (all labs ordered are listed, but only abnormal results are displayed) Labs Reviewed  SARS CORONAVIRUS 2 BY RT PCR    EKG None  Radiology No results found.  Procedures NA   Medications Ordered in ED Medications - No data to display  ED Course/ Medical Decision Making/ A&P                           Medical Decision Making Patient presented with symptoms of nausea, vomiting and diarrhea after consumption of suspicious food. It appeared pt had mild case of gastroenteritis. She states it is improving overall. Labs work with CBC showing normal leukocytosis and mild anemia, CMP remarkable for K at 3.0 with LFTs within normal limit. Her K was repleted. Lipase was checked and negative for pancreatitis. UA without signs of dehydration and UTI. Covid and pregnancy test negative. She was given anti-emetics and felt better. She also complained of sinus congestion. Exam showed bilateral nasal polyps and TTP of frontal and maxillary sinuses. Plan to discharge pt with flonase and follow up with PCP for further workup.  -Discharge with oral anti-emetics, return ED precautions given  -Discharged with flonase and azelastine.  -PCP follow up in 7-10 days to ensure symptoms resolution and follow up on nasal polyps for further workup.   Amount and/or Complexity of Data Reviewed External Data Reviewed: labs, radiology, ECG and notes.    Details: All external data reviewed. Labs: ordered. Decision-making  details documented in ED Course.    Details: CBC, CMP, UA, UPT, Lipase. Results outlined in the MDM.  Risk OTC drugs. Prescription drug management.           Final Clinical Impression(s) / ED Diagnoses Final diagnoses:  None    Rx / DC Orders ED Discharge Orders     None      Gwenevere Abbot, MD Eligha Bridegroom. [facility] Internal Medicine Residency, PGY-2    Gwenevere Abbot, MD 06/07/22 1761    Blane Ohara, MD 06/08/22 2322

## 2022-06-06 NOTE — Discharge Instructions (Signed)
You presented with complaints of nausea, vomiting and diarrhea.  It appears your symptoms are due to food ingested.  They appear to be mild and will resolve in a few days and up to a week.  Will give you some medicine to help you with your nausea.  He also complained of sinus pressure, we will give you nasal spray to help with that as well.  It appears you have nasal polyps, so I like you to follow-up with your primary care doctor to get this evaluated further.  If nausea or vomiting is worsening unable to tolerate any fluids, please come back to the ED.  It was a pleasure taking care of you!

## 2022-10-07 ENCOUNTER — Emergency Department (HOSPITAL_BASED_OUTPATIENT_CLINIC_OR_DEPARTMENT_OTHER)
Admission: EM | Admit: 2022-10-07 | Discharge: 2022-10-07 | Discharge status: Against Medical Advice | Payer: Medicaid Other | Source: Home / Self Care

## 2022-11-03 DIAGNOSIS — Z20822 Contact with and (suspected) exposure to covid-19: Secondary | ICD-10-CM | POA: Diagnosis not present

## 2022-11-03 DIAGNOSIS — J029 Acute pharyngitis, unspecified: Secondary | ICD-10-CM | POA: Diagnosis not present

## 2022-12-26 DIAGNOSIS — R051 Acute cough: Secondary | ICD-10-CM | POA: Diagnosis not present

## 2022-12-26 DIAGNOSIS — J029 Acute pharyngitis, unspecified: Secondary | ICD-10-CM | POA: Diagnosis not present

## 2023-01-01 DIAGNOSIS — Z20822 Contact with and (suspected) exposure to covid-19: Secondary | ICD-10-CM | POA: Diagnosis not present

## 2023-01-01 DIAGNOSIS — J019 Acute sinusitis, unspecified: Secondary | ICD-10-CM | POA: Diagnosis not present

## 2023-03-18 ENCOUNTER — Emergency Department (HOSPITAL_BASED_OUTPATIENT_CLINIC_OR_DEPARTMENT_OTHER)
Admission: EM | Admit: 2023-03-18 | Discharge: 2023-03-18 | Disposition: A | Discharge status: Home / Self Care | Payer: Medicaid Other | Attending: Emergency Medicine | Admitting: Emergency Medicine

## 2023-03-18 ENCOUNTER — Encounter (HOSPITAL_BASED_OUTPATIENT_CLINIC_OR_DEPARTMENT_OTHER): Payer: Self-pay

## 2023-03-18 ENCOUNTER — Other Ambulatory Visit: Payer: Self-pay

## 2023-03-18 ENCOUNTER — Other Ambulatory Visit (HOSPITAL_BASED_OUTPATIENT_CLINIC_OR_DEPARTMENT_OTHER): Payer: Self-pay

## 2023-03-18 DIAGNOSIS — L232 Allergic contact dermatitis due to cosmetics: Secondary | ICD-10-CM | POA: Diagnosis not present

## 2023-03-18 DIAGNOSIS — L243 Irritant contact dermatitis due to cosmetics: Secondary | ICD-10-CM | POA: Diagnosis not present

## 2023-03-18 DIAGNOSIS — T7840XA Allergy, unspecified, initial encounter: Secondary | ICD-10-CM | POA: Diagnosis present

## 2023-03-18 MED ORDER — DEXAMETHASONE SODIUM PHOSPHATE 10 MG/ML IJ SOLN
10.0000 mg | Freq: Once | INTRAMUSCULAR | Status: DC
  Filled 2023-03-18: qty 1

## 2023-03-18 MED ORDER — DEXAMETHASONE SODIUM PHOSPHATE 10 MG/ML IJ SOLN
10.0000 mg | Freq: Once | INTRAMUSCULAR | Status: AC
  Administered 2023-03-18: 10 mg via INTRAMUSCULAR

## 2023-03-18 MED ORDER — DIPHENHYDRAMINE HCL 50 MG/ML IJ SOLN
50.0000 mg | Freq: Once | INTRAMUSCULAR | Status: AC
  Administered 2023-03-18: 50 mg via INTRAMUSCULAR

## 2023-03-18 MED ORDER — FAMOTIDINE 20 MG PO TABS
20.0000 mg | ORAL_TABLET | Freq: Once | ORAL | Status: AC
  Administered 2023-03-18: 20 mg via ORAL
  Filled 2023-03-18: qty 1

## 2023-03-18 MED ORDER — DIPHENHYDRAMINE HCL 50 MG/ML IJ SOLN
50.0000 mg | Freq: Once | INTRAMUSCULAR | Status: DC
  Filled 2023-03-18: qty 1

## 2023-03-18 MED ORDER — PREDNISONE 10 MG PO TABS
40.0000 mg | ORAL_TABLET | Freq: Every day | ORAL | 0 refills | Status: AC
  Filled 2023-03-18: qty 20, 5d supply, fill #0

## 2023-03-18 MED ORDER — SODIUM CHLORIDE 0.9 % IV BOLUS: 500.0000 mL | Freq: Once | INTRAVENOUS | Status: DC

## 2023-03-18 MED ORDER — FAMOTIDINE IN NACL 20-0.9 MG/50ML-% IV SOLN
20.0000 mg | Freq: Once | INTRAVENOUS | Status: DC
  Filled 2023-03-18: qty 50

## 2023-03-18 NOTE — ED Notes (Signed)
Discharge instructions, follow up care, and prescription reviewed and explained, pt verbalized understanding and had no further questions on d/c. Pt caox4, ambulatory, NAD on d/c.  

## 2023-03-18 NOTE — Discharge Instructions (Signed)
Thank you for letting us take care of you today.  We gave you multiple medications to help treat an allergic reaction.  As discussed, I am discharging you home on prednisone over the next few days to help with your symptoms.  Please take this as prescribed.  If you continue to have symptoms, follow-up with her PCP this week.  For any new or worsening symptoms including spreading rash, difficulty breathing, swelling over the face or throat, fever, chest pain, or other new, concerning symptoms, please return to the nearest ED for reevaluation.

## 2023-03-18 NOTE — ED Triage Notes (Signed)
Patient here POV from Home.  Endorses Synthetic Hair placed and removed 4-5 Days ago. Noted Burning to facial Area, Upper Back, Neck. No fevers or rash noted. History of Same.  Benadryl last PM without much relief.   NAD Noted during triage. A&Ox4. Gcs 15. Ambulatory.

## 2023-03-18 NOTE — ED Provider Notes (Signed)
[place] EMERGENCY DEPARTMENT AT [facility] Provider Note   CSN: [id number] Arrival date & time: 03/18/23  1323     History  Chief Complaint  Patient presents with   Allergic Reaction    Kellie Davila is a 37 y.o. female with past medical history anemia and anxiety who presents to the ED complaining of allergic reaction.  She notes that she had symptomatic care placed about 4 days ago and developed itching and burning of her scalp and face.  She removed it the next day but has continued to have symptoms.  She took a 25 mg Benadryl last night without relief.  Notes that she has had similar reactions in the past.  No associated shortness of breath, difficulty swallowing, facial swelling or throat swelling, nausea, vomiting, or diarrhea.  After removing the synthetic hair, she did thoroughly wash her hair yesterday to remove any remaining product.  No new medications.  No other known new exposures.      Home Medications Prior to Admission medications   Medication Sig Start Date End Date Taking? Authorizing Provider  predniSONE (DELTASONE) 10 MG tablet Take 4 tablets (40 mg total) by mouth daily for 5 days. 03/18/23 03/23/23 Yes Cherina Dhillon L, PA-C  azelastine (ASTELIN) 0.1 % nasal spray Place 2 sprays into both nostrils 2 (two) times daily. Use in each nostril as directed 06/06/22 07/06/22  Gwenevere Abbot, MD  EPINEPHrine 0.3 mg/0.3 mL IJ SOAJ injection Inject 0.3 mg into the muscle as needed for anaphylaxis. 03/21/22   Tegeler, Canary Brim, MD  famotidine (PEPCID) 20 MG tablet Take 1 tablet (20 mg total) by mouth 2 (two) times daily. 03/19/22   Hyman Hopes, Margaux, PA-C  fluticasone (FLONASE) 50 MCG/ACT nasal spray Place 2 sprays into both nostrils daily. 06/06/22 07/06/22  Gwenevere Abbot, MD  ibuprofen (ADVIL) 600 MG tablet Take 1 tablet (600 mg total) by mouth every 6 (six) hours as needed. 12/04/20   Domenick Gong, MD  ibuprofen (ADVIL) 800 MG tablet Take 1 tablet (800 mg total) by  mouth 3 (three) times daily. 03/16/21   Arby Barrette, MD  methocarbamol (ROBAXIN) 500 MG tablet Take 1 tablet (500 mg total) by mouth every 6 (six) hours as needed for muscle spasms. 03/16/21   Arby Barrette, MD  omeprazole (PRILOSEC) 20 MG capsule Take 1 capsule (20 mg total) by mouth daily for 14 days. 08/15/21 08/29/21  Curatolo, Adam, DO  sucralfate (CARAFATE) 1 g tablet Take 1 tablet (1 g total) by mouth 4 (four) times daily -  with meals and at bedtime for 14 days. 08/15/21 08/29/21  Curatolo, Adam, DO  albuterol (VENTOLIN HFA) 108 (90 Base) MCG/ACT inhaler Inhale 1-2 puffs into the lungs every 6 (six) hours as needed for wheezing or shortness of breath. Patient not taking: Reported on 06/17/2019 04/15/19 01/18/20  Wieters, Hallie C, PA-C  levocetirizine (XYZAL) 5 MG tablet Take 1 tablet (5 mg total) by mouth every evening. 01/13/20 12/04/20  Marcelyn Bruins, MD  triamcinolone (NASACORT) 55 MCG/ACT AERO nasal inhaler Place 2 sprays into the nose daily. Patient not taking: Reported on 06/17/2019 02/04/19 01/18/20  Marcelyn Bruins, MD      Allergies    Compazine [prochlorperazine edisylate] and Reglan [metoclopramide]    Review of Systems   Review of Systems  All other systems reviewed and are negative.   Physical Exam Updated Vital Signs BP 117/73 (BP Location: Right Arm)   Pulse 70   Temp 97.7 F (36.5 C) (Temporal)  Resp 18   Ht 5\' 2"  (1.575 m)   Wt 77.1 kg   SpO2 100%   BMI 31.09 kg/m  Physical Exam Vitals and nursing note reviewed.  Constitutional:      General: She is not in acute distress.    Appearance: Normal appearance. She is not ill-appearing or toxic-appearing.     Comments: Speaking in full sentences with ease  HENT:     Head: Normocephalic and atraumatic.     Mouth/Throat:     Mouth: Mucous membranes are moist.     Pharynx: Oropharynx is clear.     Comments: Widely patent airway, normal phonation Eyes:     Conjunctiva/sclera: Conjunctivae  normal.  Cardiovascular:     Rate and Rhythm: Normal rate and regular rhythm.     Heart sounds: No murmur heard. Pulmonary:     Effort: Pulmonary effort is normal. No respiratory distress.     Breath sounds: Normal breath sounds. No stridor. No wheezing, rhonchi or rales.  Chest:     Chest wall: No tenderness.  Abdominal:     General: Abdomen is flat.     Palpations: Abdomen is soft.  Musculoskeletal:        General: Normal range of motion.     Cervical back: Neck supple.     Right lower leg: No edema.     Left lower leg: No edema.  Skin:    General: Skin is warm and dry.     Capillary Refill: Capillary refill takes less than 2 seconds.     Comments: No rash, lesions, excoriations, increased warmth, drainage, or other abnormalities noted throughout scalp, neck, face, and upper chest  Neurological:     Mental Status: She is alert. Mental status is at baseline.  Psychiatric:        Behavior: Behavior normal.     ED Results / Procedures / Treatments   Labs (all labs ordered are listed, but only abnormal results are displayed) Labs Reviewed - No data to display  EKG None  Radiology No results found.  Procedures Procedures    Medications Ordered in ED Medications  dexamethasone (DECADRON) injection 10 mg (10 mg Intramuscular Given 03/18/23 1615)  diphenhydrAMINE (BENADRYL) injection 50 mg (50 mg Intramuscular Given 03/18/23 1617)  famotidine (PEPCID) tablet 20 mg (20 mg Oral Given 03/18/23 1618)    ED Course/ Medical Decision Making/ A&P                             Medical Decision Making Risk Prescription drug management.   Medical Decision Making:   Kellie Davila is a 37 y.o. female who presented to the ED today with allergic reaction detailed above.    Patient's presentation is complicated by their history of new exposure.  Complete initial physical exam performed, notably the patient was in no acute distress.  Lungs clear to auscultation.  Widely patent  airway.  No signs of rash or other skin abnormalities to the face, scalp, neck, and upper chest.    Reviewed and confirmed nursing documentation for past medical history, family history, social history.    Initial Assessment:   With the patient's presentation, differential diagnosis includes but is not limited to contact dermatitis, anaphylaxis, medication reaction, urticaria, airway obstruction.  This is most consistent with an acute complicated illness  Initial Plan:  Symptomatic management Objective evaluation as below reviewed    Final Assessment and Plan:   37 year old female presents  to the ED complaining of allergic reaction after she is contact with symptomatic care.  She has removed.  She has washed started early but continues to have symptoms.  Benadryl unhelpful at home.  No medications given as above to treat allergic reaction.  No airway obstruction.  Lungs clear to auscultation.  No multisystem involvement, no anaphylaxis.  Normotensive.  Afebrile.  Following these medications, patient with good relief of symptoms.  Will discharge home on steroid burst for 5 days and have follow-up with PCP as needed for any continued symptoms and/or referral to allergist.  Patient agreeable with plan.  Strict ED return precautions given, all questions answered, and stable for discharge.   Clinical Impression:  1. Allergic reaction, initial encounter   2. Irritant contact dermatitis due to cosmetics      Discharge           Final Clinical Impression(s) / ED Diagnoses Final diagnoses:  Allergic reaction, initial encounter  Irritant contact dermatitis due to cosmetics    Rx / DC Orders ED Discharge Orders          Ordered    predniSONE (DELTASONE) 10 MG tablet  Daily        03/18/23 1727              Tonette Lederer, PA-C 03/18/23 1732    Mardene Sayer, MD 03/19/23 [phone]

## 2023-03-19 DIAGNOSIS — T7840XA Allergy, unspecified, initial encounter: Secondary | ICD-10-CM | POA: Diagnosis not present

## 2023-03-27 DIAGNOSIS — M545 Low back pain, unspecified: Secondary | ICD-10-CM | POA: Diagnosis not present

## 2023-03-27 DIAGNOSIS — R140 Abdominal distension (gaseous): Secondary | ICD-10-CM | POA: Diagnosis not present

## 2023-03-27 DIAGNOSIS — M6289 Other specified disorders of muscle: Secondary | ICD-10-CM | POA: Diagnosis not present

## 2023-03-27 DIAGNOSIS — K5900 Constipation, unspecified: Secondary | ICD-10-CM | POA: Diagnosis not present

## 2023-06-17 DIAGNOSIS — N898 Other specified noninflammatory disorders of vagina: Secondary | ICD-10-CM | POA: Diagnosis not present

## 2023-06-17 DIAGNOSIS — E559 Vitamin D deficiency, unspecified: Secondary | ICD-10-CM | POA: Diagnosis not present

## 2023-06-17 DIAGNOSIS — R350 Frequency of micturition: Secondary | ICD-10-CM | POA: Diagnosis not present

## 2023-06-17 DIAGNOSIS — N920 Excessive and frequent menstruation with regular cycle: Secondary | ICD-10-CM | POA: Diagnosis not present

## 2023-06-17 DIAGNOSIS — Z113 Encounter for screening for infections with a predominantly sexual mode of transmission: Secondary | ICD-10-CM | POA: Diagnosis not present

## 2023-06-17 DIAGNOSIS — F4323 Adjustment disorder with mixed anxiety and depressed mood: Secondary | ICD-10-CM | POA: Diagnosis not present

## 2023-06-17 DIAGNOSIS — R102 Pelvic and perineal pain: Secondary | ICD-10-CM | POA: Diagnosis not present

## 2023-06-17 DIAGNOSIS — E6609 Other obesity due to excess calories: Secondary | ICD-10-CM | POA: Diagnosis not present

## 2023-06-17 DIAGNOSIS — Z6833 Body mass index (BMI) 33.0-33.9, adult: Secondary | ICD-10-CM | POA: Diagnosis not present

## 2023-06-28 DIAGNOSIS — N760 Acute vaginitis: Secondary | ICD-10-CM | POA: Diagnosis not present

## 2023-06-28 DIAGNOSIS — R102 Pelvic and perineal pain: Secondary | ICD-10-CM | POA: Diagnosis not present

## 2023-06-28 DIAGNOSIS — Z3202 Encounter for pregnancy test, result negative: Secondary | ICD-10-CM | POA: Diagnosis not present

## 2023-07-12 DIAGNOSIS — R519 Headache, unspecified: Secondary | ICD-10-CM | POA: Diagnosis not present

## 2023-07-12 DIAGNOSIS — J3089 Other allergic rhinitis: Secondary | ICD-10-CM | POA: Diagnosis not present

## 2023-07-12 DIAGNOSIS — N898 Other specified noninflammatory disorders of vagina: Secondary | ICD-10-CM | POA: Diagnosis not present

## 2023-07-12 DIAGNOSIS — H938X1 Other specified disorders of right ear: Secondary | ICD-10-CM | POA: Diagnosis not present

## 2023-07-25 DIAGNOSIS — J029 Acute pharyngitis, unspecified: Secondary | ICD-10-CM | POA: Diagnosis not present

## 2023-07-25 DIAGNOSIS — Z20822 Contact with and (suspected) exposure to covid-19: Secondary | ICD-10-CM | POA: Diagnosis not present

## 2023-08-01 DIAGNOSIS — N898 Other specified noninflammatory disorders of vagina: Secondary | ICD-10-CM | POA: Diagnosis not present

## 2023-08-01 DIAGNOSIS — N760 Acute vaginitis: Secondary | ICD-10-CM | POA: Diagnosis not present

## 2023-08-01 DIAGNOSIS — B9689 Other specified bacterial agents as the cause of diseases classified elsewhere: Secondary | ICD-10-CM | POA: Diagnosis not present

## 2023-08-21 ENCOUNTER — Other Ambulatory Visit: Payer: Self-pay | Admitting: *Deleted

## 2023-08-21 NOTE — Patient Outreach (Signed)
  Medicaid Managed Care   Unsuccessful Attempt Note   08/21/2023 Name: Kellie Davila MRN: [id number] DOB: 1986-01-26  Referred by: [name], PA-C Reason for referral : High Risk Managed Medicaid (Unsuccessful RNCM initial telephone outreach)   An unsuccessful telephone outreach was attempted today. The patient was referred to the case management team for assistance with care management and care coordination.    Follow Up Plan: A HIPAA compliant phone message was left for the patient providing contact information and requesting a return call. and The Managed Medicaid care management team will reach out to the patient again over the next 7 days.    Estanislado Emms RN, BSN [place]  [facility] RN Care Coordinator [phone]

## 2023-08-21 NOTE — Patient Instructions (Signed)
Visit Information  [name]  - as a part of your Medicaid benefit, you are eligible for care management and care coordination services at no cost or copay. I was unable to reach you by phone today but would be happy to help you with your health related needs. Please feel free to call me @ [phone].   A member of the Managed Medicaid care management team will reach out to you again over the next 7 days.   Estanislado Emms RN, BSN [place]  [facility] RN Care Coordinator [phone]

## 2023-08-27 DIAGNOSIS — F4320 Adjustment disorder, unspecified: Secondary | ICD-10-CM | POA: Diagnosis not present

## 2023-08-27 DIAGNOSIS — N898 Other specified noninflammatory disorders of vagina: Secondary | ICD-10-CM | POA: Diagnosis not present

## 2023-08-30 DIAGNOSIS — B9689 Other specified bacterial agents as the cause of diseases classified elsewhere: Secondary | ICD-10-CM | POA: Diagnosis not present

## 2023-08-30 DIAGNOSIS — R102 Pelvic and perineal pain: Secondary | ICD-10-CM | POA: Diagnosis not present

## 2023-08-30 DIAGNOSIS — N939 Abnormal uterine and vaginal bleeding, unspecified: Secondary | ICD-10-CM | POA: Diagnosis not present

## 2023-08-30 DIAGNOSIS — N760 Acute vaginitis: Secondary | ICD-10-CM | POA: Diagnosis not present

## 2023-09-21 DIAGNOSIS — N760 Acute vaginitis: Secondary | ICD-10-CM | POA: Diagnosis not present

## 2023-09-21 DIAGNOSIS — R059 Cough, unspecified: Secondary | ICD-10-CM | POA: Diagnosis not present

## 2023-09-21 DIAGNOSIS — Z20822 Contact with and (suspected) exposure to covid-19: Secondary | ICD-10-CM | POA: Diagnosis not present

## 2023-09-21 DIAGNOSIS — J069 Acute upper respiratory infection, unspecified: Secondary | ICD-10-CM | POA: Diagnosis not present

## 2023-09-23 DIAGNOSIS — F4322 Adjustment disorder with anxiety: Secondary | ICD-10-CM | POA: Diagnosis not present

## 2023-10-07 ENCOUNTER — Telehealth: Payer: Self-pay

## 2023-10-07 NOTE — Progress Notes (Signed)

## 2023-10-08 DIAGNOSIS — F411 Generalized anxiety disorder: Secondary | ICD-10-CM | POA: Diagnosis not present

## 2023-10-16 DIAGNOSIS — F411 Generalized anxiety disorder: Secondary | ICD-10-CM | POA: Diagnosis not present

## 2023-11-08 DIAGNOSIS — N760 Acute vaginitis: Secondary | ICD-10-CM | POA: Diagnosis not present

## 2023-11-08 DIAGNOSIS — J019 Acute sinusitis, unspecified: Secondary | ICD-10-CM | POA: Diagnosis not present

## 2023-11-12 DIAGNOSIS — F411 Generalized anxiety disorder: Secondary | ICD-10-CM | POA: Diagnosis not present

## 2023-11-21 DIAGNOSIS — F411 Generalized anxiety disorder: Secondary | ICD-10-CM | POA: Diagnosis not present

## 2023-11-26 DIAGNOSIS — F411 Generalized anxiety disorder: Secondary | ICD-10-CM | POA: Diagnosis not present

## 2023-12-18 DIAGNOSIS — F411 Generalized anxiety disorder: Secondary | ICD-10-CM | POA: Diagnosis not present

## 2024-01-20 ENCOUNTER — Other Ambulatory Visit (HOSPITAL_BASED_OUTPATIENT_CLINIC_OR_DEPARTMENT_OTHER): Payer: Self-pay

## 2024-01-24 DIAGNOSIS — U071 COVID-19: Secondary | ICD-10-CM | POA: Diagnosis not present

## 2024-01-24 DIAGNOSIS — R059 Cough, unspecified: Secondary | ICD-10-CM | POA: Diagnosis not present

## 2024-01-24 DIAGNOSIS — Z20822 Contact with and (suspected) exposure to covid-19: Secondary | ICD-10-CM | POA: Diagnosis not present

## 2024-01-24 DIAGNOSIS — N390 Urinary tract infection, site not specified: Secondary | ICD-10-CM | POA: Diagnosis not present

## 2024-03-11 DIAGNOSIS — F411 Generalized anxiety disorder: Secondary | ICD-10-CM | POA: Diagnosis not present

## 2024-03-16 DIAGNOSIS — E559 Vitamin D deficiency, unspecified: Secondary | ICD-10-CM | POA: Diagnosis not present

## 2024-03-16 DIAGNOSIS — Z113 Encounter for screening for infections with a predominantly sexual mode of transmission: Secondary | ICD-10-CM | POA: Diagnosis not present

## 2024-03-16 DIAGNOSIS — F32A Depression, unspecified: Secondary | ICD-10-CM | POA: Diagnosis not present

## 2024-03-16 DIAGNOSIS — F419 Anxiety disorder, unspecified: Secondary | ICD-10-CM | POA: Diagnosis not present

## 2024-03-16 DIAGNOSIS — N898 Other specified noninflammatory disorders of vagina: Secondary | ICD-10-CM | POA: Diagnosis not present

## 2024-04-23 DIAGNOSIS — Z124 Encounter for screening for malignant neoplasm of cervix: Secondary | ICD-10-CM | POA: Diagnosis not present

## 2024-04-23 DIAGNOSIS — R102 Pelvic and perineal pain: Secondary | ICD-10-CM | POA: Diagnosis not present

## 2024-04-23 DIAGNOSIS — N926 Irregular menstruation, unspecified: Secondary | ICD-10-CM | POA: Diagnosis not present

## 2024-04-23 DIAGNOSIS — Z8489 Family history of other specified conditions: Secondary | ICD-10-CM | POA: Diagnosis not present

## 2024-04-23 DIAGNOSIS — N760 Acute vaginitis: Secondary | ICD-10-CM | POA: Diagnosis not present

## 2024-04-23 DIAGNOSIS — Z0000 Encounter for general adult medical examination without abnormal findings: Secondary | ICD-10-CM | POA: Diagnosis not present

## 2024-05-25 DIAGNOSIS — N390 Urinary tract infection, site not specified: Secondary | ICD-10-CM | POA: Diagnosis not present

## 2024-05-25 DIAGNOSIS — Z113 Encounter for screening for infections with a predominantly sexual mode of transmission: Secondary | ICD-10-CM | POA: Diagnosis not present

## 2024-05-28 DIAGNOSIS — D219 Benign neoplasm of connective and other soft tissue, unspecified: Secondary | ICD-10-CM | POA: Diagnosis not present

## 2024-05-28 DIAGNOSIS — N926 Irregular menstruation, unspecified: Secondary | ICD-10-CM | POA: Diagnosis not present

## 2024-05-28 DIAGNOSIS — R102 Pelvic and perineal pain: Secondary | ICD-10-CM | POA: Diagnosis not present

## 2024-06-07 DIAGNOSIS — N7689 Other specified inflammation of vagina and vulva: Secondary | ICD-10-CM | POA: Diagnosis not present

## 2024-06-16 DIAGNOSIS — N898 Other specified noninflammatory disorders of vagina: Secondary | ICD-10-CM | POA: Diagnosis not present

## 2024-07-17 ENCOUNTER — Emergency Department (HOSPITAL_COMMUNITY): Admission: EM | Admit: 2024-07-17 | Discharge: 2024-07-17 | Disposition: A | Discharge status: Home / Self Care

## 2024-07-17 ENCOUNTER — Encounter (HOSPITAL_COMMUNITY): Payer: Self-pay

## 2024-07-17 DIAGNOSIS — R110 Nausea: Secondary | ICD-10-CM | POA: Diagnosis not present

## 2024-07-17 DIAGNOSIS — R1084 Generalized abdominal pain: Secondary | ICD-10-CM | POA: Diagnosis not present

## 2024-07-17 DIAGNOSIS — R42 Dizziness and giddiness: Secondary | ICD-10-CM | POA: Diagnosis not present

## 2024-07-17 DIAGNOSIS — R079 Chest pain, unspecified: Secondary | ICD-10-CM | POA: Insufficient documentation

## 2024-07-17 DIAGNOSIS — R7401 Elevation of levels of liver transaminase levels: Secondary | ICD-10-CM | POA: Diagnosis not present

## 2024-07-17 DIAGNOSIS — R0789 Other chest pain: Secondary | ICD-10-CM | POA: Diagnosis not present

## 2024-07-17 DIAGNOSIS — F10120 Alcohol abuse with intoxication, uncomplicated: Secondary | ICD-10-CM

## 2024-07-17 LAB — BASIC METABOLIC PANEL WITH GFR
Anion gap: 11 (ref 5–15)
BUN: 5 mg/dL — ABNORMAL LOW (ref 6–20)
CO2: 23 mmol/L (ref 22–32)
Calcium: 8.8 mg/dL — ABNORMAL LOW (ref 8.9–10.3)
Chloride: 106 mmol/L (ref 98–111)
Creatinine, Ser: 0.77 mg/dL (ref 0.44–1.00)
GFR, Estimated: >60 mL/min (ref >60)
Glucose, Bld: 91 mg/dL (ref 70–99)
Potassium: 3.7 mmol/L (ref 3.5–5.1)
Sodium: 140 mmol/L (ref 135–145)

## 2024-07-17 LAB — CBC
HCT: 31.7 % — ABNORMAL LOW (ref 36.0–46.0)
Hemoglobin: 10.2 g/dL — ABNORMAL LOW (ref 12.0–15.0)
MCH: 29.6 pg (ref 26.0–34.0)
MCHC: 32.2 g/dL (ref 30.0–36.0)
MCV: 91.9 fL (ref 80.0–100.0)
Platelets: UNDETERMINED K/uL (ref 150–400)
RBC: 3.45 MIL/uL — ABNORMAL LOW (ref 3.87–5.11)
RDW: 14.1 % (ref 11.5–15.5)
WBC: 7 K/uL (ref 4.0–10.5)
nRBC: 0 % (ref 0.0–0.2)

## 2024-07-17 LAB — TROPONIN I (HIGH SENSITIVITY): Troponin I (High Sensitivity): 3 ng/L (ref <18)

## 2024-07-17 LAB — URINALYSIS, ROUTINE W REFLEX MICROSCOPIC
Bilirubin Urine: NEGATIVE
Glucose, UA: NEGATIVE mg/dL
Hgb urine dipstick: NEGATIVE
Ketones, ur: NEGATIVE mg/dL
Leukocytes,Ua: NEGATIVE
Nitrite: NEGATIVE
Protein, ur: NEGATIVE mg/dL
Specific Gravity, Urine: 1.012 (ref 1.005–1.030)
pH: 8 (ref 5.0–8.0)

## 2024-07-17 LAB — COMPREHENSIVE METABOLIC PANEL WITH GFR
ALT: 52 U/L — ABNORMAL HIGH (ref 0–44)
AST: 51 U/L — ABNORMAL HIGH (ref 15–41)
Albumin: 4.2 g/dL (ref 3.5–5.0)
Alkaline Phosphatase: 40 U/L (ref 38–126)
Anion gap: 11 (ref 5–15)
BUN: 5 mg/dL — ABNORMAL LOW (ref 6–20)
CO2: 20 mmol/L — ABNORMAL LOW (ref 22–32)
Calcium: 9.2 mg/dL (ref 8.9–10.3)
Chloride: 108 mmol/L (ref 98–111)
Creatinine, Ser: 0.86 mg/dL (ref 0.44–1.00)
GFR, Estimated: >60 mL/min (ref >60)
Glucose, Bld: 89 mg/dL (ref 70–99)
Potassium: 5.6 mmol/L — ABNORMAL HIGH (ref 3.5–5.1)
Sodium: 139 mmol/L (ref 135–145)
Total Bilirubin: 1 mg/dL (ref 0.0–1.2)
Total Protein: 7.8 g/dL (ref 6.5–8.1)

## 2024-07-17 LAB — I-STAT CHEM 8, ED
BUN: 10 mg/dL (ref 6–20)
Calcium, Ion: 1.1 mmol/L — ABNORMAL LOW (ref 1.15–1.40)
Chloride: 108 mmol/L (ref 98–111)
Creatinine, Ser: 0.9 mg/dL (ref 0.44–1.00)
Glucose, Bld: 90 mg/dL (ref 70–99)
HCT: 36 % (ref 36.0–46.0)
Hemoglobin: 12.2 g/dL (ref 12.0–15.0)
Potassium: 6.7 mmol/L (ref 3.5–5.1)
Sodium: 139 mmol/L (ref 135–145)
TCO2: 25 mmol/L (ref 22–32)

## 2024-07-17 LAB — DIFFERENTIAL
Abs Immature Granulocytes: 0.03 K/uL (ref 0.00–0.07)
Basophils Absolute: 0 K/uL (ref 0.0–0.1)
Basophils Relative: 0 %
Eosinophils Absolute: 0 K/uL (ref 0.0–0.5)
Eosinophils Relative: 0 %
Immature Granulocytes: 0 %
Lymphocytes Relative: 17 %
Lymphs Abs: 1.2 K/uL (ref 0.7–4.0)
Monocytes Absolute: 0.2 K/uL (ref 0.1–1.0)
Monocytes Relative: 3 %
Neutro Abs: 5.6 K/uL (ref 1.7–7.7)
Neutrophils Relative %: 80 %

## 2024-07-17 LAB — HCG, SERUM, QUALITATIVE: Preg, Serum: NEGATIVE

## 2024-07-17 MED ORDER — NAPROXEN 375 MG PO TABS: 375.0000 mg | ORAL_TABLET | Freq: Two times a day (BID) | ORAL | 0 refills | Status: AC

## 2024-07-17 MED ORDER — PROMETHAZINE HCL 25 MG PO TABS
25.0000 mg | ORAL_TABLET | Freq: Once | ORAL | Status: AC
  Administered 2024-07-17: 25 mg via ORAL
  Filled 2024-07-17: qty 1

## 2024-07-17 MED ORDER — ONDANSETRON HCL 4 MG PO TABS: 4.0000 mg | ORAL_TABLET | Freq: Three times a day (TID) | ORAL | 0 refills | Status: AC | PRN

## 2024-07-17 MED ORDER — SODIUM CHLORIDE 0.9 % IV BOLUS
1000.0000 mL | Freq: Once | INTRAVENOUS | Status: AC
  Administered 2024-07-17: 1000 mL via INTRAVENOUS

## 2024-07-17 NOTE — ED Provider Notes (Signed)
 Patient with n/v headache and generalized weakness after heavy alcohol use last night. Waiting for repeat labs due to hemolysis.  Tx w/  Physical Exam  BP 97/63 (BP Location: Left Arm)   Pulse 73   Temp 97.9 F (36.6 C)   Resp 16   SpO2 98%   Physical Exam  Procedures  Procedures  ED Course / MDM   Clinical Course as of 07/17/24 1537  Fri Jul 17, 2024  1257 Reports she tolerates phenergan  fine.  [JB]    Clinical Course User Index [JB] Barrett, Warren SAILOR, PA-C   Medical Decision Making Amount and/or Complexity of Data Reviewed Labs: ordered.  Risk Prescription drug management.   Labs reviewed. No active vomiting, discharged with Zofran  and anti-inflammatories appropriate for discharge at this time       Kellie Davila, Kellie Davila 07/17/24 1745    Kellie Jerilynn RAMAN, MD 07/21/24 [phone]

## 2024-07-17 NOTE — ED Triage Notes (Signed)
 Pt arrived via [facility] from home complaining of epigastric pain, dizziness and chest discomfort. Patient admits to have been drinking and experiencing a hangover. PT states hangover feels worse than normal.  120/71 75 98% CBG 103  20G on right AC placed by EMS  4 Zofran  and 324 aspirin given by EMS prior to arrival

## 2024-07-17 NOTE — ED Provider Notes (Signed)
 [place] EMERGENCY DEPARTMENT AT [facility] Provider Note   CSN: [id number] Arrival date & time: 07/17/24  1230     Patient presents with: Dizziness and Abdominal Pain   Kellie Davila is a 38 y.o. female with past medical history presents to emergency room with complaint of abdominal pain, nausea, vomiting and chest pain after drinking alcohol heavily last night.  Her abdominal pain is generalized and worse when she is vomiting.  She also reports she feels dehydrated, generalized weakness.  She is not having presyncope or syncope.  No vertigo sensation with this. Given Zofran  with EMS with mild improvement.     Dizziness Abdominal Pain      Prior to Admission medications   Medication Sig Start Date End Date Taking? Authorizing Provider  azelastine  (ASTELIN ) 0.1 % nasal spray Place 2 sprays into both nostrils 2 (two) times daily. Use in each nostril as directed 06/06/22 07/06/22  Fernand Prost, MD  EPINEPHrine  0.3 mg/0.3 mL IJ SOAJ injection Inject 0.3 mg into the muscle as needed for anaphylaxis. 03/21/22   Tegeler, Lonni JINNY, MD  famotidine  (PEPCID ) 20 MG tablet Take 1 tablet (20 mg total) by mouth 2 (two) times daily. 03/19/22   Shepard, Margaux, PA-C  fluticasone  (FLONASE ) 50 MCG/ACT nasal spray Place 2 sprays into both nostrils daily. 06/06/22 07/06/22  Fernand Prost, MD  ibuprofen  (ADVIL ) 600 MG tablet Take 1 tablet (600 mg total) by mouth every 6 (six) hours as needed. 12/04/20   Mortenson, Ashley, MD  ibuprofen  (ADVIL ) 800 MG tablet Take 1 tablet (800 mg total) by mouth 3 (three) times daily. 03/16/21   Armenta Canning, MD  methocarbamol  (ROBAXIN ) 500 MG tablet Take 1 tablet (500 mg total) by mouth every 6 (six) hours as needed for muscle spasms. 03/16/21   Armenta Canning, MD  omeprazole  (PRILOSEC) 20 MG capsule Take 1 capsule (20 mg total) by mouth daily for 14 days. 08/15/21 08/29/21  Curatolo, Adam, DO  sucralfate  (CARAFATE ) 1 g tablet Take 1 tablet (1 g total) by mouth 4  (four) times daily -  with meals and at bedtime for 14 days. 08/15/21 08/29/21  Curatolo, Adam, DO  albuterol  (VENTOLIN  HFA) 108 (90 Base) MCG/ACT inhaler Inhale 1-2 puffs into the lungs every 6 (six) hours as needed for wheezing or shortness of breath. Patient not taking: Reported on 06/17/2019 04/15/19 01/18/20  Wieters, Hallie C, PA-C  levocetirizine (XYZAL ) 5 MG tablet Take 1 tablet (5 mg total) by mouth every evening. 01/13/20 12/04/20  Jeneal Danita Macintosh, MD  triamcinolone  (NASACORT ) 55 MCG/ACT AERO nasal inhaler Place 2 sprays into the nose daily. Patient not taking: Reported on 06/17/2019 02/04/19 01/18/20  Jeneal Danita Macintosh, MD    Allergies: Compazine  [prochlorperazine  edisylate] and Reglan  Hüseyin.Heal ]    Review of Systems  Gastrointestinal:  Positive for abdominal pain.  Neurological:  Positive for dizziness.    Updated Vital Signs There were no vitals taken for this visit.  Physical Exam Vitals and nursing note reviewed.  Constitutional:      General: She is not in acute distress.    Appearance: She is not toxic-appearing.  HENT:     Head: Normocephalic and atraumatic.  Eyes:     General: No scleral icterus.    Conjunctiva/sclera: Conjunctivae normal.  Cardiovascular:     Rate and Rhythm: Normal rate and regular rhythm.     Pulses: Normal pulses.     Heart sounds: Normal heart sounds.  Pulmonary:     Effort: Pulmonary effort  is normal. No respiratory distress.     Breath sounds: Normal breath sounds.  Abdominal:     General: Abdomen is flat. Bowel sounds are normal.     Palpations: Abdomen is soft.     Tenderness: There is no abdominal tenderness.     Comments: Abdomen is soft, nondistended, nontender.  Skin:    General: Skin is warm and dry.     Findings: No lesion.  Neurological:     General: No focal deficit present.     Mental Status: She is alert and oriented to person, place, and time. Mental status is at baseline.     (all labs ordered are  listed, but only abnormal results are displayed) Labs Reviewed  COMPREHENSIVE METABOLIC PANEL WITH GFR - Abnormal; Notable for the following components:      Result Value   Potassium 5.6 (*)    CO2 20 (*)    BUN 5 (*)    AST 51 (*)    ALT 52 (*)    All other components within normal limits  I-STAT CHEM 8, ED - Abnormal; Notable for the following components:   Potassium 6.7 (*)    Calcium, Ion 1.10 (*)    All other components within normal limits  URINALYSIS, ROUTINE W REFLEX MICROSCOPIC  HCG, SERUM, QUALITATIVE  LIPASE, BLOOD  CBC WITH DIFFERENTIAL/PLATELET  CBC  DIFFERENTIAL  BASIC METABOLIC PANEL WITH GFR  TROPONIN I (HIGH SENSITIVITY)    EKG: None  Radiology: No results found.   Procedures   Medications Ordered in the ED  sodium chloride  0.9 % bolus 1,000 mL (has no administration in time range)  promethazine  (PHENERGAN ) tablet 25 mg (has no administration in time range)    Clinical Course as of 07/17/24 1301  Fri Jul 17, 2024  1257 Reports she tolerates phenergan  fine.  [JB]    Clinical Course User Index [JB] Igor Bishop, Warren SAILOR, PA-C                                 Medical Decision Making Amount and/or Complexity of Data Reviewed Labs: ordered.  Risk Prescription drug management.   This patient presents to the ED for concern of abdominal pain, this involves an extensive number of treatment options, and is a complaint that carries with it a high risk of complications and morbidity.  The differential diagnosis includes cholecystitis, AAA, appendicitis, renal stone, UTI   Co morbidities that complicate the patient evaluation  None     Lab Tests:  I personally interpreted labs.  The pertinent results include:   She does have mildly elevated potassium at 5.6 although I think this is likely hemolysis as CBC was hemolysed/clotted.  She has slightly elevated AST, ALT but does admit to heavy drinking last night.  Her troponin is 3.  CBC and BMP are going  to be redrawn due to hemolysis.   Imaging Studies ordered:  Considered, however patients abdomen soft, non tender.    Cardiac Monitoring: / EKG:  The patient was maintained on a cardiac monitor.  I personally viewed and interpreted the cardiac monitored which showed an underlying rhythm of: Normal sinus rhythm   Problem List / ED Course / Critical interventions / Medication management  Patient reporting to emergency room with complaint of abdominal pain.  This started after drinking alcohol yesterday.  Abdominal pain is generalized and she has reassuring abdominal exam with no focal area of tenderness.  Her vital signs are stable and she is well-appearing.  She was given Zofran  with EMS and notes some mild improvement.  She does have chest pain which she locates to primarily epigastric area but will obtain EKG and troponin.  Given vomiting and abdominal pain will obtain labs.  She is denying any urinary symptoms. I ordered medication including NS, phenergan .  Reevaluation of the patient after these medicines showed that the patient improved I have reviewed the patients home medicines and have made adjustments as needed Patient is tolerating oral intake and she does report improvement in symptoms. Patient signed off to [facility].      Final diagnoses:  None    ED Discharge Orders     None          Shermon Warren SAILOR, PA-C 07/17/24 1541    Simon Lavonia SAILOR, MD 07/19/24 [phone]

## 2024-07-17 NOTE — Discharge Instructions (Signed)
Hangovers For many people, a night of drinking can lead to a painful morning after and the dreaded effects of a hangover.  What Is a Hangover? A hangover refers to a set of symptoms that occur as a consequence of excessive alcohol use. Typical symptoms include fatigue, weakness, thirst, headache, muscle aches, nausea, stomach pain, vertigo, sensitivity to light and sound, anxiety, irritability, sweating, and increased blood pressure. A hangover can vary from person to person.  What Causes Hangover Symptoms? A number of factors can contribute to hangovers:  Mild dehydration: Alcohol suppresses the release of vasopressin, a hormone produced by the brain that sends signals to the kidneys causing them to retain fluid. As a result, alcohol increases urination and excess loss of fluids. The mild dehydration that results likely contributes to hangover symptoms such as thirst, fatigue, and a headache.  Disrupted sleep: People may fall asleep faster after drinking alcohol, but their sleep is fragmented, and they tend to wake up earlier. This contributes to fatigue, as well as lost productivity.  Gastrointestinal irritation: Alcohol directly irritates the lining of the stomach and increases acid release. This can lead to nausea and stomach discomfort. Inflammation: Alcohol increases inflammation in the body. Inflammation contributes to the malaise that people feel when they are sick, so it may play a role in hangover symptoms as well.  Acetaldehyde exposure: Alcohol metabolism, primarily by the liver, creates the compound acetaldehyde, a toxic, short-lived by-product, which contributes to inflammation in the liver, pancreas, brain, gastrointestinal tract, and other organs.  Other Substances That Contribute to Hangover Symptoms Alcohol is the main culprit in a hangover, but other components of alcoholic beverages might contribute to hangover symptoms or make a hangover worse.  Congeners are  compounds,other than ethyl alcohol, that are produced during fermentation.These substances contribute to the taste and smell of alcoholic beverages. Darker spirits, such as bourbon, which tend to have higher levels of congeners than clear spirits, could worsen hangover symptomsfor some people.  Sulfites are compounds that are added to wine as preservatives. People who have a sensitivity to sulfites may experience a headache after drinking wine.  Mini-withdrawal: While drinking, individuals may feel calmer, more relaxed, and even euphoric, but the brain quickly adjusts to those positive effects as it tries to maintain balance. As a result, when the buzz wears off, people can feel more restless and anxious than before they drank. Because individuals are so different, it is difficult to predict how many drinks will cause a hangover. Anytime people drink to intoxication, there is a chance they could have a hangover the next day.  When Does a Hangover Peak and How Long does It Last? Hangover symptoms peak when the blood alcohol concentration in the body returns to about zero. The symptoms can last 24 hours or longer.  Are Hangovers Dangerous or Just Painful? Hangovers can be both painful and dangerous. During a hangover, a person's attention, decision-making, and muscle coordination can all be impaired. Also, the ability to perform important tasks, such as driving, operating machinery, or caring for others can be negatively affected.  Are There Any Remedies for a Hangover? Although many remedies for alleviating hangovers are mentioned on the web and in social media, none have been scientifically proven to be effective. There is no magic potion for beating hangovers--and only time can help. A person must wait for the body to finish clearing the toxic by-products of alcohol metabolism, to rehydrate, to heal irritated tissue, and to restore immunities and brain activity to normal.  There is no way to speed  up the brain's recovery from alcohol use -- drinking coffee, taking a shower, or having an alcoholic beverage the next morning will not cure a hangover. Some people take over-the-counter pain relievers (often acetaminophen) before going to bed to minimize hangovers. It is important to recognize that the combination of alcohol and acetaminophen can be toxic to the liver. Like alcohol, certain over-the-counter pain relievers, including aspirin and ibuprofen, can increase acid release and irritate the lining of the stomach. Proceed with caution when using these medications before or after consuming alcohol. To help ease their hangover symptoms, some people turn to electrolyte-rich sports drinks or other products, or even intravenous (IV) treatments, in an effort to treat electrolyte imbalance caused by increased urination and fluid loss as a result of drinking. Research has not found a correlation between the extent of electrolyte disruptions and the severity of hangovers, or the impact of added electrolytes on hangover severity. In most people, the body will quickly restore electrolyte balance once the effects of alcohol subside. Ultimately, the only surefire remedy for a hangover is to avoid getting one by not drinking excessively

## 2024-10-01 DIAGNOSIS — R519 Headache, unspecified: Secondary | ICD-10-CM | POA: Diagnosis not present

## 2024-10-01 DIAGNOSIS — R319 Hematuria, unspecified: Secondary | ICD-10-CM | POA: Diagnosis not present

## 2024-10-01 DIAGNOSIS — Z20822 Contact with and (suspected) exposure to covid-19: Secondary | ICD-10-CM | POA: Diagnosis not present
